# Patient Record
Sex: Male | Born: 1960
Health system: Southern US, Community
[De-identification: ages and names within clinical notes are randomized; demographics above are authoritative.]

## PROBLEM LIST (undated history)

## (undated) DIAGNOSIS — E1165 Type 2 diabetes mellitus with hyperglycemia: Secondary | ICD-10-CM

## (undated) DIAGNOSIS — B20 Human immunodeficiency virus [HIV] disease: Secondary | ICD-10-CM

## (undated) DIAGNOSIS — R945 Abnormal results of liver function studies: Secondary | ICD-10-CM

## (undated) DIAGNOSIS — E119 Type 2 diabetes mellitus without complications: Secondary | ICD-10-CM

## (undated) DIAGNOSIS — R002 Palpitations: Secondary | ICD-10-CM

## (undated) DIAGNOSIS — E785 Hyperlipidemia, unspecified: Secondary | ICD-10-CM

## (undated) DIAGNOSIS — E1169 Type 2 diabetes mellitus with other specified complication: Secondary | ICD-10-CM

## (undated) DIAGNOSIS — R21 Rash and other nonspecific skin eruption: Secondary | ICD-10-CM

## (undated) DIAGNOSIS — I517 Cardiomegaly: Secondary | ICD-10-CM

## (undated) DIAGNOSIS — E669 Obesity, unspecified: Secondary | ICD-10-CM

## (undated) DIAGNOSIS — Z21 Asymptomatic human immunodeficiency virus [HIV] infection status: Secondary | ICD-10-CM

## (undated) DIAGNOSIS — IMO0002 Reserved for concepts with insufficient information to code with codable children: Secondary | ICD-10-CM

## (undated) HISTORY — DX: Obesity, unspecified: E66.9

## (undated) HISTORY — DX: Palpitations: R00.2

## (undated) HISTORY — DX: Type 2 diabetes mellitus with other specified complication: E11.69

## (undated) HISTORY — DX: Abnormal results of liver function studies: R94.5

## (undated) HISTORY — DX: Reserved for concepts with insufficient information to code with codable children: IMO0002

## (undated) HISTORY — DX: Cardiomegaly: I51.7

## (undated) HISTORY — DX: Hyperlipidemia, unspecified: E78.5

## (undated) HISTORY — DX: Rash and other nonspecific skin eruption: R21

## (undated) HISTORY — DX: Type 2 diabetes mellitus with hyperglycemia: E11.65

## (undated) HISTORY — DX: Type 2 diabetes mellitus without complications: E11.9

## (undated) HISTORY — DX: Asymptomatic human immunodeficiency virus (hiv) infection status: Z21

## (undated) HISTORY — DX: Human immunodeficiency virus (HIV) disease: B20

---

## 2003-10-18 ENCOUNTER — Inpatient Hospital Stay (HOSPITAL_COMMUNITY): Admission: AD | Admit: 2003-10-18 | Discharge: 2003-10-22 | Payer: Self-pay | Admitting: Internal Medicine

## 2003-11-29 ENCOUNTER — Encounter (INDEPENDENT_AMBULATORY_CARE_PROVIDER_SITE_OTHER): Payer: Self-pay | Admitting: *Deleted

## 2003-11-29 LAB — CONVERTED CEMR LAB
CD4 Count: 200 microliters
CD4 T Cell Abs: 200

## 2003-12-01 ENCOUNTER — Inpatient Hospital Stay (HOSPITAL_COMMUNITY): Admission: EM | Admit: 2003-12-01 | Discharge: 2003-12-07 | Payer: Self-pay | Admitting: Emergency Medicine

## 2003-12-02 ENCOUNTER — Encounter: Payer: Self-pay | Admitting: Internal Medicine

## 2004-01-30 ENCOUNTER — Encounter: Admission: RE | Admit: 2004-01-30 | Discharge: 2004-01-30 | Payer: Self-pay | Admitting: Infectious Diseases

## 2004-01-30 ENCOUNTER — Ambulatory Visit (HOSPITAL_COMMUNITY): Admission: RE | Admit: 2004-01-30 | Discharge: 2004-01-30 | Payer: Self-pay | Admitting: Infectious Diseases

## 2004-02-13 ENCOUNTER — Encounter: Admission: RE | Admit: 2004-02-13 | Discharge: 2004-02-13 | Payer: Self-pay | Admitting: Infectious Diseases

## 2004-06-12 ENCOUNTER — Ambulatory Visit (HOSPITAL_COMMUNITY): Admission: RE | Admit: 2004-06-12 | Discharge: 2004-06-12 | Payer: Self-pay | Admitting: Infectious Diseases

## 2004-06-12 ENCOUNTER — Encounter: Admission: RE | Admit: 2004-06-12 | Discharge: 2004-06-12 | Payer: Self-pay | Admitting: Infectious Diseases

## 2004-07-10 ENCOUNTER — Encounter: Admission: RE | Admit: 2004-07-10 | Discharge: 2004-07-10 | Payer: Self-pay | Admitting: Infectious Diseases

## 2004-11-07 ENCOUNTER — Ambulatory Visit: Payer: Self-pay | Admitting: Internal Medicine

## 2004-11-07 ENCOUNTER — Ambulatory Visit (HOSPITAL_COMMUNITY): Admission: RE | Admit: 2004-11-07 | Discharge: 2004-11-07 | Payer: Self-pay | Admitting: Infectious Diseases

## 2005-01-19 ENCOUNTER — Ambulatory Visit: Payer: Self-pay | Admitting: Infectious Diseases

## 2005-04-20 ENCOUNTER — Ambulatory Visit (HOSPITAL_COMMUNITY): Admission: RE | Admit: 2005-04-20 | Discharge: 2005-04-20 | Payer: Self-pay | Admitting: Infectious Diseases

## 2005-04-20 ENCOUNTER — Ambulatory Visit: Payer: Self-pay | Admitting: Infectious Diseases

## 2005-05-26 ENCOUNTER — Ambulatory Visit: Payer: Self-pay | Admitting: Infectious Diseases

## 2005-11-04 ENCOUNTER — Ambulatory Visit: Payer: Self-pay | Admitting: Infectious Diseases

## 2006-01-04 ENCOUNTER — Ambulatory Visit: Payer: Self-pay | Admitting: Infectious Diseases

## 2006-04-29 ENCOUNTER — Ambulatory Visit: Payer: Self-pay | Admitting: Infectious Diseases

## 2006-04-29 ENCOUNTER — Encounter (INDEPENDENT_AMBULATORY_CARE_PROVIDER_SITE_OTHER): Payer: Self-pay | Admitting: *Deleted

## 2006-04-29 ENCOUNTER — Encounter: Admission: RE | Admit: 2006-04-29 | Discharge: 2006-04-29 | Payer: Self-pay | Admitting: Infectious Diseases

## 2006-04-29 LAB — CONVERTED CEMR LAB
CD4 Count: 370 microliters
HIV 1 RNA Quant: 65 copies/mL

## 2006-05-31 ENCOUNTER — Ambulatory Visit: Payer: Self-pay | Admitting: Infectious Diseases

## 2006-10-06 ENCOUNTER — Ambulatory Visit: Payer: Self-pay | Admitting: Infectious Diseases

## 2006-10-06 ENCOUNTER — Encounter: Admission: RE | Admit: 2006-10-06 | Discharge: 2006-10-06 | Payer: Self-pay | Admitting: Infectious Diseases

## 2006-10-06 ENCOUNTER — Encounter (INDEPENDENT_AMBULATORY_CARE_PROVIDER_SITE_OTHER): Payer: Self-pay | Admitting: *Deleted

## 2006-10-06 LAB — CONVERTED CEMR LAB
ALT: 212 units/L — ABNORMAL HIGH (ref 0–40)
AST: 174 units/L — ABNORMAL HIGH (ref 0–37)
Albumin: 4 g/dL (ref 3.5–5.2)
Alkaline Phosphatase: 127 units/L — ABNORMAL HIGH (ref 39–117)
BUN: 11 mg/dL (ref 6–23)
Basophils Absolute: 0.1 10*3/uL (ref 0.0–0.1)
Basophils percent auto: 2 % — ABNORMAL HIGH (ref 0–1)
CD4 Count: 420 microliters
CO2: 22 meq/L (ref 19–32)
Calcium: 9.2 mg/dL (ref 8.4–10.5)
Chloride: 105 meq/L (ref 96–112)
Creatinine, Ser: 0.92 mg/dL (ref 0.40–1.50)
Eosinophils Absolute: 0.2 cells/mcL (ref 0.0–0.7)
Eosinophils Relative: 3 % (ref 0–5)
Glucose, Bld: 135 mg/dL — ABNORMAL HIGH (ref 70–99)
HCT: 43.3 % (ref 39.0–52.0)
HIV 1 RNA Quant: 49 copies/mL
HIV 1 RNA Quant: <50 copies/mL (ref <50)
HIV-1 RNA Quant, Log: <1.7 (ref <1.70)
Hemoglobin: 15.4 g/dL (ref 13.0–17.0)
Leukocyte count, blood: 5.5 10*9/L (ref 4.0–10.5)
Lymphocytes Relative: 51 % — ABNORMAL HIGH (ref 12–46)
Lymphs Abs: 2.8 10*3/uL (ref 0.7–3.3)
MCHC: 35.6 g/dL (ref 30.0–36.0)
MCV: 89.5 fL (ref 78.0–100.0)
Monocytes Absolute: 0.4 10*3/uL (ref 0.2–0.7)
Monocytes Relative: 7 % (ref 3–11)
Neutro Abs: 2.1 10*3/uL (ref 1.7–7.7)
Neutrophils Relative %: 38 % — ABNORMAL LOW (ref 43–77)
Platelets: 276 10*3/uL (ref 150–400)
Potassium: 4.2 meq/L (ref 3.5–5.3)
RBC: 4.84 M/uL (ref 4.22–5.81)
RDW: 13.2 % (ref 11.5–14.0)
Sodium: 138 meq/L (ref 135–145)
Total Bilirubin: 0.8 mg/dL (ref 0.3–1.2)
Total Protein: 7.8 g/dL (ref 6.0–8.3)

## 2006-11-26 ENCOUNTER — Encounter (INDEPENDENT_AMBULATORY_CARE_PROVIDER_SITE_OTHER): Payer: Self-pay | Admitting: Infectious Diseases

## 2006-11-26 DIAGNOSIS — B20 Human immunodeficiency virus [HIV] disease: Secondary | ICD-10-CM | POA: Insufficient documentation

## 2006-12-27 ENCOUNTER — Ambulatory Visit: Payer: Self-pay | Admitting: Infectious Diseases

## 2006-12-27 ENCOUNTER — Encounter (INDEPENDENT_AMBULATORY_CARE_PROVIDER_SITE_OTHER): Payer: Self-pay | Admitting: *Deleted

## 2006-12-27 ENCOUNTER — Encounter: Admission: RE | Admit: 2006-12-27 | Discharge: 2006-12-27 | Payer: Self-pay | Admitting: Infectious Diseases

## 2006-12-27 LAB — CONVERTED CEMR LAB
CD4 Count: 340 microliters
HIV 1 RNA Quant: 798 copies/mL

## 2007-02-13 ENCOUNTER — Emergency Department (HOSPITAL_COMMUNITY): Admission: EM | Admit: 2007-02-13 | Discharge: 2007-02-14 | Payer: Self-pay | Admitting: Emergency Medicine

## 2007-02-14 ENCOUNTER — Encounter (INDEPENDENT_AMBULATORY_CARE_PROVIDER_SITE_OTHER): Payer: Self-pay | Admitting: *Deleted

## 2007-02-14 LAB — CONVERTED CEMR LAB

## 2007-02-27 ENCOUNTER — Encounter (INDEPENDENT_AMBULATORY_CARE_PROVIDER_SITE_OTHER): Payer: Self-pay | Admitting: *Deleted

## 2007-03-10 ENCOUNTER — Ambulatory Visit: Payer: Self-pay | Admitting: Infectious Diseases

## 2007-03-10 LAB — CONVERTED CEMR LAB
ALT: 112 U/L — ABNORMAL HIGH
AST: 80 U/L — ABNORMAL HIGH
Albumin: 4.4 g/dL
Alkaline Phosphatase: 104 U/L
BUN: 15 mg/dL
Basophils Absolute: 0.1 K/uL
Basophils Relative: 2 % — ABNORMAL HIGH
CO2: 24 meq/L
Calcium: 9.7 mg/dL
Chloride: 105 meq/L
Creatinine, Ser: 1.25 mg/dL
Eosinophils Absolute: 0.2 K/uL
Eosinophils Relative: 3 %
Glucose, Bld: 91 mg/dL
HCT: 45.2 %
HIV 1 RNA Quant: 72 {copies}/mL — ABNORMAL HIGH
HIV-1 RNA Quant, Log: 1.86 — ABNORMAL HIGH
Hemoglobin: 15.6 g/dL
Lymphocytes Relative: 51 % — ABNORMAL HIGH
Lymphs Abs: 3.4 K/uL — ABNORMAL HIGH
MCHC: 34.5 g/dL
MCV: 93.8 fL
Monocytes Absolute: 0.5 K/uL
Monocytes Relative: 7 %
Neutro Abs: 2.5 K/uL
Neutrophils Relative %: 37 % — ABNORMAL LOW
Platelets: 293 K/uL
Potassium: 4.4 meq/L
RBC: 4.82 M/uL
RDW: 12.6 %
Sodium: 141 meq/L
Total Bilirubin: 1.6 mg/dL — ABNORMAL HIGH
Total Protein: 8.1 g/dL
WBC: 6.7 10*3/microliter

## 2007-03-16 DIAGNOSIS — L089 Local infection of the skin and subcutaneous tissue, unspecified: Secondary | ICD-10-CM | POA: Insufficient documentation

## 2007-03-17 ENCOUNTER — Telehealth: Payer: Self-pay | Admitting: Infectious Diseases

## 2007-03-28 ENCOUNTER — Telehealth (INDEPENDENT_AMBULATORY_CARE_PROVIDER_SITE_OTHER): Payer: Self-pay | Admitting: Infectious Diseases

## 2007-04-27 ENCOUNTER — Ambulatory Visit: Payer: Self-pay | Admitting: Infectious Diseases

## 2007-07-06 ENCOUNTER — Telehealth: Payer: Self-pay | Admitting: Internal Medicine

## 2007-09-21 ENCOUNTER — Encounter: Admission: RE | Admit: 2007-09-21 | Discharge: 2007-09-21 | Payer: Self-pay | Admitting: Internal Medicine

## 2007-09-21 ENCOUNTER — Encounter: Payer: Self-pay | Admitting: Infectious Disease

## 2007-09-21 ENCOUNTER — Ambulatory Visit: Payer: Self-pay | Admitting: Internal Medicine

## 2007-09-21 LAB — CONVERTED CEMR LAB
ALT: 41 units/L (ref 0–53)
AST: 49 units/L — ABNORMAL HIGH (ref 0–37)
Albumin: 4.9 g/dL (ref 3.5–5.2)
Alkaline Phosphatase: 72 units/L (ref 39–117)
BUN: 21 mg/dL (ref 6–23)
Basophils Absolute: 0.1 10*3/uL (ref 0.0–0.1)
Basophils Relative: 2 % — ABNORMAL HIGH (ref 0–1)
CO2: 20 meq/L (ref 19–32)
Calcium: 9.4 mg/dL (ref 8.4–10.5)
Chloride: 102 meq/L (ref 96–112)
Creatinine, Ser: 0.95 mg/dL (ref 0.40–1.50)
Eosinophils Absolute: 0.2 10*3/uL (ref 0.0–0.7)
Eosinophils Relative: 3 % (ref 0–5)
Glucose, Bld: 86 mg/dL (ref 70–99)
HCT: 46.8 % (ref 39.0–52.0)
Hemoglobin: 16.2 g/dL (ref 13.0–17.0)
Lymphocytes Relative: 46 % (ref 12–46)
Lymphs Abs: 3.1 10*3/uL (ref 0.7–3.3)
MCHC: 34.6 g/dL (ref 30.0–36.0)
MCV: 99.4 fL (ref 78.0–100.0)
Monocytes Absolute: 0.3 10*3/uL (ref 0.2–0.7)
Monocytes Relative: 4 % (ref 3–11)
Neutro Abs: 3 10*3/uL (ref 1.7–7.7)
Neutrophils Relative %: 45 % (ref 43–77)
Platelets: 338 10*3/uL (ref 150–400)
Potassium: 4.5 meq/L (ref 3.5–5.3)
RBC: 4.71 M/uL (ref 4.22–5.81)
RDW: 14 % (ref 11.5–14.0)
Sodium: 134 meq/L — ABNORMAL LOW (ref 135–145)
Total Bilirubin: 1.1 mg/dL (ref 0.3–1.2)
Total Protein: 7.5 g/dL (ref 6.0–8.3)
WBC: 6.6 10*3/uL (ref 4.0–10.5)

## 2007-09-22 ENCOUNTER — Encounter: Payer: Self-pay | Admitting: Infectious Disease

## 2007-09-22 LAB — CONVERTED CEMR LAB
HIV 1 RNA Quant: 152 copies/mL — ABNORMAL HIGH (ref <50)
HIV-1 RNA Quant, Log: 2.18 — ABNORMAL HIGH (ref <1.70)

## 2007-11-28 ENCOUNTER — Ambulatory Visit: Payer: Self-pay | Admitting: Infectious Disease

## 2007-11-28 DIAGNOSIS — K739 Chronic hepatitis, unspecified: Secondary | ICD-10-CM | POA: Insufficient documentation

## 2007-12-20 ENCOUNTER — Encounter (INDEPENDENT_AMBULATORY_CARE_PROVIDER_SITE_OTHER): Payer: Self-pay | Admitting: *Deleted

## 2008-02-27 ENCOUNTER — Encounter (INDEPENDENT_AMBULATORY_CARE_PROVIDER_SITE_OTHER): Payer: Self-pay | Admitting: *Deleted

## 2008-03-08 ENCOUNTER — Encounter: Payer: Self-pay | Admitting: Infectious Disease

## 2008-03-13 ENCOUNTER — Encounter (INDEPENDENT_AMBULATORY_CARE_PROVIDER_SITE_OTHER): Payer: Self-pay | Admitting: *Deleted

## 2008-03-15 ENCOUNTER — Encounter: Admission: RE | Admit: 2008-03-15 | Discharge: 2008-03-15 | Payer: Self-pay | Admitting: Infectious Disease

## 2008-03-15 ENCOUNTER — Ambulatory Visit: Payer: Self-pay | Admitting: Infectious Disease

## 2008-03-15 LAB — CONVERTED CEMR LAB
ALT: 71 units/L — ABNORMAL HIGH (ref 0–53)
AST: 53 units/L — ABNORMAL HIGH (ref 0–37)
Albumin: 4.6 g/dL (ref 3.5–5.2)
Alkaline Phosphatase: 72 units/L (ref 39–117)
BUN: 11 mg/dL (ref 6–23)
Basophils Absolute: 0.1 10*3/uL (ref 0.0–0.1)
Basophils Relative: 1 % (ref 0–1)
CO2: 27 meq/L (ref 19–32)
Calcium: 9.3 mg/dL (ref 8.4–10.5)
Chloride: 104 meq/L (ref 96–112)
Cholesterol: 249 mg/dL — ABNORMAL HIGH (ref 0–200)
Creatinine, Ser: 0.98 mg/dL (ref 0.40–1.50)
Eosinophils Absolute: 0.2 10*3/uL (ref 0.0–0.7)
Eosinophils Relative: 2 % (ref 0–5)
Glucose, Bld: 57 mg/dL — ABNORMAL LOW (ref 70–99)
HCT: 43.4 % (ref 39.0–52.0)
HDL: 31 mg/dL — ABNORMAL LOW (ref >39)
HIV 1 RNA Quant: 237 copies/mL — ABNORMAL HIGH (ref <50)
HIV-1 RNA Quant, Log: 2.37 — ABNORMAL HIGH (ref <1.70)
Hemoglobin: 15.2 g/dL (ref 13.0–17.0)
Lymphocytes Relative: 45 % (ref 12–46)
Lymphs Abs: 3 10*3/uL (ref 0.7–4.0)
MCHC: 35 g/dL (ref 30.0–36.0)
MCV: 90 fL (ref 78.0–100.0)
Monocytes Absolute: 0.5 10*3/uL (ref 0.1–1.0)
Monocytes Relative: 8 % (ref 3–12)
Neutro Abs: 2.9 10*3/uL (ref 1.7–7.7)
Neutrophils Relative %: 44 % (ref 43–77)
Platelets: 275 10*3/uL (ref 150–400)
Potassium: 4 meq/L (ref 3.5–5.3)
RBC: 4.82 M/uL (ref 4.22–5.81)
RDW: 13 % (ref 11.5–15.5)
Sodium: 142 meq/L (ref 135–145)
Total Bilirubin: 1.3 mg/dL — ABNORMAL HIGH (ref 0.3–1.2)
Total CHOL/HDL Ratio: 8
Total Protein: 7.9 g/dL (ref 6.0–8.3)
Triglycerides: 457 mg/dL — ABNORMAL HIGH (ref <150)
WBC: 6.6 10*3/uL (ref 4.0–10.5)

## 2008-03-26 ENCOUNTER — Ambulatory Visit (HOSPITAL_COMMUNITY): Admission: RE | Admit: 2008-03-26 | Discharge: 2008-03-26 | Payer: Self-pay | Admitting: Infectious Disease

## 2008-03-26 ENCOUNTER — Ambulatory Visit: Payer: Self-pay | Admitting: Infectious Disease

## 2008-03-26 ENCOUNTER — Telehealth: Payer: Self-pay | Admitting: Infectious Disease

## 2008-03-26 ENCOUNTER — Observation Stay (HOSPITAL_COMMUNITY): Admission: AD | Admit: 2008-03-26 | Discharge: 2008-03-27 | Payer: Self-pay | Admitting: Infectious Disease

## 2008-03-26 ENCOUNTER — Encounter: Payer: Self-pay | Admitting: Infectious Disease

## 2008-03-26 ENCOUNTER — Encounter: Admission: RE | Admit: 2008-03-26 | Discharge: 2008-03-26 | Payer: Self-pay | Admitting: Infectious Disease

## 2008-03-26 ENCOUNTER — Ambulatory Visit: Payer: Self-pay | Admitting: Vascular Surgery

## 2008-03-26 ENCOUNTER — Encounter (INDEPENDENT_AMBULATORY_CARE_PROVIDER_SITE_OTHER): Payer: Self-pay | Admitting: *Deleted

## 2008-03-26 ENCOUNTER — Ambulatory Visit: Payer: Self-pay | Admitting: Infectious Diseases

## 2008-03-26 DIAGNOSIS — R002 Palpitations: Secondary | ICD-10-CM | POA: Insufficient documentation

## 2008-03-26 DIAGNOSIS — F411 Generalized anxiety disorder: Secondary | ICD-10-CM | POA: Insufficient documentation

## 2008-03-26 DIAGNOSIS — E781 Pure hyperglyceridemia: Secondary | ICD-10-CM | POA: Insufficient documentation

## 2008-03-26 DIAGNOSIS — R609 Edema, unspecified: Secondary | ICD-10-CM | POA: Insufficient documentation

## 2008-03-29 ENCOUNTER — Telehealth: Payer: Self-pay | Admitting: Infectious Disease

## 2008-03-29 ENCOUNTER — Encounter: Payer: Self-pay | Admitting: Infectious Disease

## 2008-04-09 ENCOUNTER — Encounter: Payer: Self-pay | Admitting: Infectious Disease

## 2008-05-09 ENCOUNTER — Telehealth: Payer: Self-pay | Admitting: Infectious Disease

## 2008-06-08 ENCOUNTER — Ambulatory Visit: Payer: Self-pay | Admitting: Infectious Disease

## 2008-06-08 LAB — CONVERTED CEMR LAB
ALT: 79 units/L — ABNORMAL HIGH (ref 0–53)
AST: 59 units/L — ABNORMAL HIGH (ref 0–37)
Absolute CD4: 648 #/uL (ref 381–1469)
Albumin: 4.5 g/dL (ref 3.5–5.2)
Alkaline Phosphatase: 74 units/L (ref 39–117)
BUN: 15 mg/dL (ref 6–23)
Basophils Absolute: 0.1 10*3/uL (ref 0.0–0.1)
Basophils Relative: 1 % (ref 0–1)
CD4 T Helper %: 18 % — ABNORMAL LOW (ref 32–62)
CO2: 20 meq/L (ref 19–32)
Calcium: 9 mg/dL (ref 8.4–10.5)
Chloride: 106 meq/L (ref 96–112)
Creatinine, Ser: 0.79 mg/dL (ref 0.40–1.50)
Eosinophils Absolute: 0.2 10*3/uL (ref 0.0–0.7)
Eosinophils Relative: 3 % (ref 0–5)
Glucose, Bld: 66 mg/dL — ABNORMAL LOW (ref 70–99)
HCT: 45 % (ref 39.0–52.0)
HIV 1 RNA Quant: 354 copies/mL — ABNORMAL HIGH (ref <50)
HIV-1 RNA Quant, Log: 2.55 — ABNORMAL HIGH (ref <1.70)
Hemoglobin: 15.8 g/dL (ref 13.0–17.0)
Lymphocytes Relative: 48 % — ABNORMAL HIGH (ref 12–46)
Lymphs Abs: 3.6 10*3/uL (ref 0.7–4.0)
MCHC: 35.1 g/dL (ref 30.0–36.0)
MCV: 89.6 fL (ref 78.0–100.0)
Monocytes Absolute: 0.7 10*3/uL (ref 0.1–1.0)
Monocytes Relative: 9 % (ref 3–12)
Neutro Abs: 2.9 10*3/uL (ref 1.7–7.7)
Neutrophils Relative %: 39 % — ABNORMAL LOW (ref 43–77)
Platelets: 259 10*3/uL (ref 150–400)
Potassium: 3.8 meq/L (ref 3.5–5.3)
RBC: 5.02 M/uL (ref 4.22–5.81)
RDW: 12.8 % (ref 11.5–15.5)
Sodium: 139 meq/L (ref 135–145)
Total Bilirubin: 2.1 mg/dL — ABNORMAL HIGH (ref 0.3–1.2)
Total Lymphocytes %: 48 % — ABNORMAL HIGH (ref 12–46)
Total Protein: 7.8 g/dL (ref 6.0–8.3)
Total lymphocyte count: 3600 cells/mcL — ABNORMAL HIGH (ref 700–3300)
WBC, lymph enumeration: 7.5 10*3/uL (ref 4.0–10.5)
WBC: 7.5 10*3/uL (ref 4.0–10.5)

## 2008-06-18 ENCOUNTER — Ambulatory Visit: Payer: Self-pay | Admitting: Infectious Disease

## 2008-06-18 LAB — CONVERTED CEMR LAB
Chlamydia, Swab/Urine, PCR: NEGATIVE
GC Probe Amp, Urine: NEGATIVE

## 2008-10-30 ENCOUNTER — Ambulatory Visit: Payer: Self-pay | Admitting: Infectious Disease

## 2008-10-30 LAB — CONVERTED CEMR LAB
ALT: 28 units/L (ref 0–53)
AST: 23 units/L (ref 0–37)
Albumin: 4.5 g/dL (ref 3.5–5.2)
Alkaline Phosphatase: 84 units/L (ref 39–117)
BUN: 19 mg/dL (ref 6–23)
Basophils Absolute: 0 10*3/uL (ref 0.0–0.1)
Basophils Relative: 1 % (ref 0–1)
CO2: 20 meq/L (ref 19–32)
Calcium: 9.1 mg/dL (ref 8.4–10.5)
Chloride: 105 meq/L (ref 96–112)
Cholesterol: 215 mg/dL — ABNORMAL HIGH (ref 0–200)
Creatinine, Ser: 0.95 mg/dL (ref 0.40–1.50)
Eosinophils Absolute: 0.2 10*3/uL (ref 0.0–0.7)
Eosinophils Relative: 3 % (ref 0–5)
Glucose, Bld: 91 mg/dL (ref 70–99)
HCT: 45.3 % (ref 39.0–52.0)
HDL: 32 mg/dL — ABNORMAL LOW (ref >39)
HIV 1 RNA Quant: <48 copies/mL (ref <48)
HIV-1 RNA Quant, Log: <1.68 (ref <1.68)
Hemoglobin: 15.6 g/dL (ref 13.0–17.0)
LDL Cholesterol: 114 mg/dL — ABNORMAL HIGH (ref 0–99)
Lymphocytes Relative: 44 % (ref 12–46)
Lymphs Abs: 2.4 10*3/uL (ref 0.7–4.0)
MCHC: 34.4 g/dL (ref 30.0–36.0)
MCV: 91.3 fL (ref 78.0–100.0)
Monocytes Absolute: 0.5 10*3/uL (ref 0.1–1.0)
Monocytes Relative: 9 % (ref 3–12)
Neutro Abs: 2.4 10*3/uL (ref 1.7–7.7)
Neutrophils Relative %: 44 % (ref 43–77)
Platelets: 286 10*3/uL (ref 150–400)
Potassium: 4.5 meq/L (ref 3.5–5.3)
RBC: 4.96 M/uL (ref 4.22–5.81)
RDW: 13.2 % (ref 11.5–15.5)
Sodium: 138 meq/L (ref 135–145)
Total Bilirubin: 0.9 mg/dL (ref 0.3–1.2)
Total CHOL/HDL Ratio: 6.7
Total Protein: 7.9 g/dL (ref 6.0–8.3)
Triglycerides: 345 mg/dL — ABNORMAL HIGH (ref <150)
VLDL: 69 mg/dL — ABNORMAL HIGH (ref 0–40)
WBC: 5.6 10*3/uL (ref 4.0–10.5)

## 2009-04-17 ENCOUNTER — Encounter (INDEPENDENT_AMBULATORY_CARE_PROVIDER_SITE_OTHER): Payer: Self-pay | Admitting: *Deleted

## 2009-05-21 ENCOUNTER — Encounter (INDEPENDENT_AMBULATORY_CARE_PROVIDER_SITE_OTHER): Payer: Self-pay | Admitting: Licensed Clinical Social Worker

## 2009-05-21 ENCOUNTER — Ambulatory Visit: Payer: Self-pay | Admitting: Infectious Disease

## 2009-05-21 LAB — CONVERTED CEMR LAB
ALT: 49 units/L (ref 0–53)
AST: 38 units/L — ABNORMAL HIGH (ref 0–37)
Albumin: 4.6 g/dL (ref 3.5–5.2)
Alkaline Phosphatase: 71 units/L (ref 39–117)
BUN: 16 mg/dL (ref 6–23)
Basophils Absolute: 0.1 10*3/uL (ref 0.0–0.1)
Basophils Relative: 1 % (ref 0–1)
CO2: 23 meq/L (ref 19–32)
Calcium: 9.5 mg/dL (ref 8.4–10.5)
Chloride: 105 meq/L (ref 96–112)
Creatinine, Ser: 0.98 mg/dL (ref 0.40–1.50)
Eosinophils Absolute: 0.2 10*3/uL (ref 0.0–0.7)
Eosinophils Relative: 4 % (ref 0–5)
GFR calc Af Amer: >60 mL/min (ref >60)
GFR calc non Af Amer: >60 mL/min (ref >60)
Glucose, Bld: 90 mg/dL (ref 70–99)
HCT: 45.4 % (ref 39.0–52.0)
HIV 1 RNA Quant: <48 copies/mL (ref <48)
HIV-1 RNA Quant, Log: <1.68 (ref <1.68)
Hemoglobin: 15.5 g/dL (ref 13.0–17.0)
Lymphocytes Relative: 40 % (ref 12–46)
Lymphs Abs: 2.3 10*3/uL (ref 0.7–4.0)
MCHC: 34.1 g/dL (ref 30.0–36.0)
MCV: 91.2 fL (ref 78.0–100.0)
Monocytes Absolute: 0.5 10*3/uL (ref 0.1–1.0)
Monocytes Relative: 8 % (ref 3–12)
Neutro Abs: 2.8 10*3/uL (ref 1.7–7.7)
Neutrophils Relative %: 47 % (ref 43–77)
Platelets: 285 10*3/uL (ref 150–400)
Potassium: 4.3 meq/L (ref 3.5–5.3)
RBC: 4.98 M/uL (ref 4.22–5.81)
RDW: 13.1 % (ref 11.5–15.5)
Sodium: 139 meq/L (ref 135–145)
Total Bilirubin: 1.8 mg/dL — ABNORMAL HIGH (ref 0.3–1.2)
Total Protein: 8 g/dL (ref 6.0–8.3)
WBC: 5.8 10*3/uL (ref 4.0–10.5)

## 2009-09-11 ENCOUNTER — Ambulatory Visit: Payer: Self-pay | Admitting: Infectious Disease

## 2009-09-11 ENCOUNTER — Ambulatory Visit (HOSPITAL_COMMUNITY): Admission: RE | Admit: 2009-09-11 | Discharge: 2009-09-11 | Payer: Self-pay | Admitting: Infectious Disease

## 2009-09-11 DIAGNOSIS — L255 Unspecified contact dermatitis due to plants, except food: Secondary | ICD-10-CM | POA: Insufficient documentation

## 2009-09-11 DIAGNOSIS — L259 Unspecified contact dermatitis, unspecified cause: Secondary | ICD-10-CM | POA: Insufficient documentation

## 2009-09-12 ENCOUNTER — Encounter: Payer: Self-pay | Admitting: Infectious Disease

## 2009-09-12 LAB — CONVERTED CEMR LAB
Amphetamine Screen, Ur: NEGATIVE
Barbiturate Quant, Ur: NEGATIVE
Benzodiazepines.: NEGATIVE
Cocaine Metabolites: NEGATIVE
Creatinine,U: 98.9 mg/dL
Marijuana Metabolite: NEGATIVE
Methadone: NEGATIVE
Opiate Screen, Urine: NEGATIVE
Phencyclidine (PCP): NEGATIVE
Propoxyphene: NEGATIVE

## 2009-10-16 ENCOUNTER — Ambulatory Visit (HOSPITAL_COMMUNITY): Admission: RE | Admit: 2009-10-16 | Discharge: 2009-10-16 | Payer: Self-pay | Admitting: Infectious Disease

## 2009-10-16 ENCOUNTER — Encounter: Payer: Self-pay | Admitting: Infectious Disease

## 2009-10-16 ENCOUNTER — Ambulatory Visit: Payer: Self-pay | Admitting: Cardiology

## 2010-01-20 ENCOUNTER — Encounter (INDEPENDENT_AMBULATORY_CARE_PROVIDER_SITE_OTHER): Payer: Self-pay | Admitting: *Deleted

## 2010-02-07 ENCOUNTER — Encounter: Payer: Self-pay | Admitting: Infectious Disease

## 2010-02-25 ENCOUNTER — Encounter (INDEPENDENT_AMBULATORY_CARE_PROVIDER_SITE_OTHER): Payer: Self-pay | Admitting: *Deleted

## 2010-04-29 ENCOUNTER — Encounter: Payer: Self-pay | Admitting: Infectious Disease

## 2010-05-05 ENCOUNTER — Ambulatory Visit: Payer: Self-pay | Admitting: Infectious Disease

## 2010-05-05 LAB — CONVERTED CEMR LAB
ALT: 145 units/L — ABNORMAL HIGH (ref 0–53)
AST: 128 units/L — ABNORMAL HIGH (ref 0–37)
Albumin: 4.3 g/dL (ref 3.5–5.2)
Alkaline Phosphatase: 102 units/L (ref 39–117)
BUN: 12 mg/dL (ref 6–23)
Basophils Absolute: 0.1 10*3/uL (ref 0.0–0.1)
Basophils Relative: 2 % — ABNORMAL HIGH (ref 0–1)
CO2: 23 meq/L (ref 19–32)
Calcium: 8.8 mg/dL (ref 8.4–10.5)
Chloride: 103 meq/L (ref 96–112)
Cholesterol: 210 mg/dL — ABNORMAL HIGH (ref 0–200)
Creatinine, Ser: 0.88 mg/dL (ref 0.40–1.50)
Eosinophils Absolute: 0.2 10*3/uL (ref 0.0–0.7)
Eosinophils Relative: 4 % (ref 0–5)
Glucose, Bld: 178 mg/dL — ABNORMAL HIGH (ref 70–99)
HCT: 44.8 % (ref 39.0–52.0)
HDL: 32 mg/dL — ABNORMAL LOW (ref >39)
HIV 1 RNA Quant: 243 copies/mL — ABNORMAL HIGH (ref <48)
HIV-1 RNA Quant, Log: 2.39 — ABNORMAL HIGH (ref <1.68)
Hemoglobin: 15.6 g/dL (ref 13.0–17.0)
Lymphocytes Relative: 55 % — ABNORMAL HIGH (ref 12–46)
Lymphs Abs: 2.3 10*3/uL (ref 0.7–4.0)
MCHC: 34.8 g/dL (ref 30.0–36.0)
MCV: 89.4 fL (ref 78.0–100.0)
Monocytes Absolute: 0.3 10*3/uL (ref 0.1–1.0)
Monocytes Relative: 7 % (ref 3–12)
Neutro Abs: 1.3 10*3/uL — ABNORMAL LOW (ref 1.7–7.7)
Neutrophils Relative %: 33 % — ABNORMAL LOW (ref 43–77)
Platelets: 218 10*3/uL (ref 150–400)
Potassium: 3.7 meq/L (ref 3.5–5.3)
RBC: 5.01 M/uL (ref 4.22–5.81)
RDW: 13 % (ref 11.5–15.5)
Sodium: 135 meq/L (ref 135–145)
Total Bilirubin: 1 mg/dL (ref 0.3–1.2)
Total CHOL/HDL Ratio: 6.6
Total Protein: 7.5 g/dL (ref 6.0–8.3)
Triglycerides: 402 mg/dL — ABNORMAL HIGH (ref <150)
WBC: 4.1 10*3/uL (ref 4.0–10.5)

## 2010-05-22 ENCOUNTER — Telehealth: Payer: Self-pay

## 2010-09-03 ENCOUNTER — Ambulatory Visit: Payer: Self-pay | Admitting: Infectious Disease

## 2010-09-03 LAB — CONVERTED CEMR LAB
ALT: 148 units/L — ABNORMAL HIGH (ref 0–53)
AST: 106 units/L — ABNORMAL HIGH (ref 0–37)
Albumin: 4.2 g/dL (ref 3.5–5.2)
Alkaline Phosphatase: 100 units/L (ref 39–117)
BUN: 20 mg/dL (ref 6–23)
Basophils Absolute: 0.1 10*3/uL (ref 0.0–0.1)
Basophils Relative: 2 % — ABNORMAL HIGH (ref 0–1)
CO2: 24 meq/L (ref 19–32)
Calcium: 9 mg/dL (ref 8.4–10.5)
Chloride: 106 meq/L (ref 96–112)
Creatinine, Ser: 1.01 mg/dL (ref 0.40–1.50)
Eosinophils Absolute: 0.3 10*3/uL (ref 0.0–0.7)
Eosinophils Relative: 5 % (ref 0–5)
Glucose, Bld: 180 mg/dL — ABNORMAL HIGH (ref 70–99)
HCT: 43.5 % (ref 39.0–52.0)
HIV 1 RNA Quant: 192 copies/mL — ABNORMAL HIGH (ref <20)
HIV-1 RNA Quant, Log: 2.28 — ABNORMAL HIGH (ref <1.30)
Hemoglobin: 15.2 g/dL (ref 13.0–17.0)
Lymphocytes Relative: 43 % (ref 12–46)
Lymphs Abs: 2.4 10*3/uL (ref 0.7–4.0)
MCHC: 34.9 g/dL (ref 30.0–36.0)
MCV: 92.2 fL (ref 78.0–100.0)
Monocytes Absolute: 0.4 10*3/uL (ref 0.1–1.0)
Monocytes Relative: 8 % (ref 3–12)
Neutro Abs: 2.4 10*3/uL (ref 1.7–7.7)
Neutrophils Relative %: 43 % (ref 43–77)
Platelets: 214 10*3/uL (ref 150–400)
Potassium: 4.1 meq/L (ref 3.5–5.3)
RBC: 4.72 M/uL (ref 4.22–5.81)
RDW: 13.1 % (ref 11.5–15.5)
Sodium: 137 meq/L (ref 135–145)
Total Bilirubin: 0.7 mg/dL (ref 0.3–1.2)
Total Protein: 7.3 g/dL (ref 6.0–8.3)
WBC: 5.6 10*3/uL (ref 4.0–10.5)

## 2010-10-07 ENCOUNTER — Ambulatory Visit: Payer: Self-pay | Admitting: Infectious Disease

## 2010-10-07 DIAGNOSIS — G4700 Insomnia, unspecified: Secondary | ICD-10-CM | POA: Insufficient documentation

## 2010-10-07 DIAGNOSIS — H9209 Otalgia, unspecified ear: Secondary | ICD-10-CM | POA: Insufficient documentation

## 2010-10-08 ENCOUNTER — Encounter: Payer: Self-pay | Admitting: Infectious Disease

## 2010-10-08 LAB — CONVERTED CEMR LAB
Chlamydia, Swab/Urine, PCR: NEGATIVE
GC Probe Amp, Urine: NEGATIVE
HIV 1 RNA Quant: <20 copies/mL (ref <20)
HIV-1 RNA Quant, Log: <1.3 (ref <1.30)
Hgb A1c MFr Bld: 7.2 % — ABNORMAL HIGH (ref <5.7)

## 2010-10-20 ENCOUNTER — Telehealth: Payer: Self-pay | Admitting: Infectious Disease

## 2010-10-22 ENCOUNTER — Telehealth: Payer: Self-pay | Admitting: Infectious Disease

## 2010-10-24 ENCOUNTER — Telehealth (INDEPENDENT_AMBULATORY_CARE_PROVIDER_SITE_OTHER): Payer: Self-pay | Admitting: Licensed Clinical Social Worker

## 2010-11-11 ENCOUNTER — Encounter (INDEPENDENT_AMBULATORY_CARE_PROVIDER_SITE_OTHER): Payer: Self-pay | Admitting: *Deleted

## 2011-01-18 LAB — CONVERTED CEMR LAB
ALT: 151 units/L — ABNORMAL HIGH (ref 0–53)
ALT: 58 units/L — ABNORMAL HIGH (ref 0–53)
AST: 136 units/L — ABNORMAL HIGH (ref 0–37)
AST: 45 units/L — ABNORMAL HIGH (ref 0–37)
Albumin: 4.3 g/dL (ref 3.5–5.2)
Albumin: 4.5 g/dL (ref 3.5–5.2)
Alkaline Phosphatase: 143 units/L — ABNORMAL HIGH (ref 39–117)
Alkaline Phosphatase: 78 units/L (ref 39–117)
BUN: 13 mg/dL (ref 6–23)
BUN: 18 mg/dL (ref 6–23)
Basophils Absolute: 0 10*3/uL (ref 0.0–0.1)
Basophils Absolute: 0.1 10*3/uL (ref 0.0–0.1)
Basophils Relative: 1 % (ref 0–1)
Basophils Relative: 2 % — ABNORMAL HIGH (ref 0–1)
Bilirubin Urine: NEGATIVE
CO2: 24 meq/L (ref 19–32)
CO2: 24 meq/L (ref 19–32)
Calcium: 9.1 mg/dL (ref 8.4–10.5)
Calcium: 9.3 mg/dL (ref 8.4–10.5)
Chlamydia, Swab/Urine, PCR: NEGATIVE
Chloride: 103 meq/L (ref 96–112)
Chloride: 105 meq/L (ref 96–112)
Cholesterol, target level: 200 mg/dL
Cholesterol: 220 mg/dL — ABNORMAL HIGH (ref 0–200)
Cholesterol: 254 mg/dL — ABNORMAL HIGH (ref 0–200)
Creatinine, Ser: 0.95 mg/dL (ref 0.40–1.50)
Creatinine, Ser: 1.06 mg/dL (ref 0.40–1.50)
Creatinine, Urine: 99.1 mg/dL
Eosinophils Absolute: 0.2 10*3/uL (ref 0.0–0.7)
Eosinophils Relative: 3 % (ref 0–5)
Eosinophils Relative: 3 % (ref 0–5)
Free T4: 0.91 ng/dL (ref 0.80–1.80)
GC Probe Amp, Urine: NEGATIVE
Glucose, Bld: 208 mg/dL — ABNORMAL HIGH (ref 70–99)
Glucose, Bld: 82 mg/dL (ref 70–99)
HCT: 44.3 % (ref 39.0–52.0)
HCT: 47.9 % (ref 39.0–52.0)
HDL goal, serum: 40 mg/dL
HDL: 29 mg/dL — ABNORMAL LOW (ref >39)
HDL: 30 mg/dL — ABNORMAL LOW (ref >39)
HIV 1 RNA Quant: 74 copies/mL — ABNORMAL HIGH (ref <48)
HIV 1 RNA Quant: 798 copies/mL — ABNORMAL HIGH (ref <50)
HIV-1 RNA Quant, Log: 1.87 — ABNORMAL HIGH (ref <1.68)
HIV-1 RNA Quant, Log: 2.9 — ABNORMAL HIGH (ref <1.70)
Hemoglobin, Urine: NEGATIVE
Hemoglobin: 15.7 g/dL (ref 13.0–17.0)
Hemoglobin: 16.6 g/dL (ref 13.0–17.0)
Hgb A1c MFr Bld: 6 %
Ketones, ur: NEGATIVE mg/dL
LDL Cholesterol: 135 mg/dL — ABNORMAL HIGH (ref 0–99)
LDL Goal: 100 mg/dL
Leukocytes, UA: NEGATIVE
Lymphocytes Relative: 41 % (ref 12–46)
Lymphocytes Relative: 41 % (ref 12–46)
Lymphs Abs: 2.6 10*3/uL (ref 0.7–4.0)
Lymphs Abs: 2.8 10*3/uL (ref 0.7–3.3)
MCHC: 34.7 g/dL (ref 30.0–36.0)
MCHC: 35.4 g/dL (ref 30.0–36.0)
MCV: 89.9 fL (ref >=78.0)
MCV: 93.4 fL (ref 78.0–100.0)
Microalb Creat Ratio: 37.7 mg/g — ABNORMAL HIGH (ref 0.0–30.0)
Microalb, Ur: 3.74 mg/dL — ABNORMAL HIGH (ref 0.00–1.89)
Monocytes Absolute: 0.4 10*3/uL (ref 0.1–1.0)
Monocytes Absolute: 0.5 10*3/uL (ref 0.2–0.7)
Monocytes Relative: 6 % (ref 3–12)
Monocytes Relative: 7 % (ref 3–11)
Neutro Abs: 3 10*3/uL (ref 1.7–7.7)
Neutro Abs: 3.2 10*3/uL (ref 1.7–7.7)
Neutrophils Relative %: 48 % (ref 43–77)
Neutrophils Relative %: 49 % (ref 43–77)
Nitrite: NEGATIVE
Platelets: 237 10*3/uL (ref 150–400)
Platelets: 299 10*3/uL (ref 150–400)
Potassium: 4.1 meq/L (ref 3.5–5.3)
Potassium: 4.1 meq/L (ref 3.5–5.3)
Protein, ur: 30 mg/dL — AB
RBC / HPF: NONE SEEN (ref <3)
RBC: 4.93 M/uL (ref 4.22–5.81)
RBC: 5.13 M/uL (ref 4.22–5.81)
RDW: 12.8 % (ref 11.5–15.5)
RDW: 13.3 % (ref 11.5–14.0)
Sodium: 137 meq/L (ref 135–145)
Sodium: 139 meq/L (ref 135–145)
Specific Gravity, Urine: 1.029 (ref 1.005–1.03)
T3, Total: 143 ng/dL (ref >=80.0)
TSH: 1.999 microintl units/mL (ref 0.350–4.5)
Total Bilirubin: 1.4 mg/dL — ABNORMAL HIGH (ref 0.3–1.2)
Total Bilirubin: 1.6 mg/dL — ABNORMAL HIGH (ref 0.3–1.2)
Total CHOL/HDL Ratio: 7.3
Total CHOL/HDL Ratio: 8.8
Total Protein: 7.6 g/dL (ref 6.0–8.3)
Total Protein: 8.5 g/dL — ABNORMAL HIGH (ref 6.0–8.3)
Triglycerides: 276 mg/dL — ABNORMAL HIGH (ref <150)
Triglycerides: 566 mg/dL — ABNORMAL HIGH (ref <150)
Urine Glucose: >1000 mg/dL — AB
Urobilinogen, UA: 0.2 (ref 0.0–1.0)
VLDL: 55 mg/dL — ABNORMAL HIGH (ref 0–40)
WBC, UA: NONE SEEN cells/hpf (ref <3)
WBC: 6.2 10*3/uL (ref 4.0–10.5)
WBC: 6.8 10*3/uL (ref 4.0–10.5)
pH: 5.5 (ref 5.0–8.0)

## 2011-01-20 NOTE — Miscellaneous (Signed)
  Clinical Lists Changes  Observations: Added new observation of YEARAIDSPOS: 2004  (11/11/2010 15:42)

## 2011-01-20 NOTE — Miscellaneous (Signed)
Summary: Lab orders/tkk  Clinical Lists Changes  Orders: Added new Test order of T-CBC w/Diff [phone]) - Signed Added new Test order of T-CD4SP [facility]) (CD4SP) - Signed Added new Test order of T-HIV Viral Load [phone]) - Signed Added new Test order of T-Comprehensive Metabolic Panel ([id number]) - Signed

## 2011-01-20 NOTE — Letter (Signed)
Summary: Appt. Notice  Appt. Notice   Imported By: Florinda Marker 02/11/2010 15:42:22  _____________________________________________________________________  External Attachment:    Type:   Image     Comment:   External Document

## 2011-01-20 NOTE — Miscellaneous (Signed)
Summary: clincial update/ryan white NCADAP appr til 03/21/11  Clinical Lists Changes  Observations: Added new observation of AIDSDAP: Yes 2011 (02/25/2010 8:56)

## 2011-01-20 NOTE — Progress Notes (Signed)
Summary: change abx  Phone Note Call from Patient   Caller: Patient Call For: Paulette Blanch Dam MD Summary of Call: Patient is uninsured wants to know if his abx can be changed to something else besides clindamycin need to be called in to [facility] [place]. Initial call taken by: Starleen Arms CMA,  October 22, 2010 4:45 PM  Follow-up for Phone Call        amoxicillin is cheap but he reportedly has an allegy to augmentin. So that takes out penicillin, amoxicillin. Moxifloxacin would be an alternative but it is going to be even more expensive. Azithromycin is not necessarily cheap and not great drug for tooth infection. He should see if someone has deal with cheap clindamycin. I dont know what else to do for him. Can you double check what his augmentin allergy was? I fit was diarrhea then I would give him amoxicillin rechallenge at 500mg  three times a day. or PCN VK 500mg  tid That would be cheap. Follow-up by: Acey Lav MD,  October 22, 2010 8:37 PM

## 2011-01-20 NOTE — Miscellaneous (Signed)
Summary: clinical update/ryan white NCADAP application completed  Clinical Lists Changes  Observations: Added new observation of PCTFPL: 157.07  (01/20/2010 12:14) Added new observation of HOUSEINCOME: 16109  (01/20/2010 12:14) Added new observation of FAMILYSIZE: 5  (01/20/2010 12:14) Added new observation of #CHILD<18 IN: Yes  (01/20/2010 12:14) Added new observation of FINASSESSDT: 01/20/2010  (01/20/2010 12:14)

## 2011-01-20 NOTE — Progress Notes (Signed)
Summary: Requesting Lab results   Phone Note Call from Patient   Caller: Patient Summary of Call: Pt is calling for lab results. He says he is out of town and not able to keep visit.  His last OV  with Dr Feliberto Gottron is 9-10. Advised pt to reschedule OV for follow up.  He is insisting that we give him his lab results. I will forward this message to Dr  Daiva Eves. Tomasita Morrow RN  May 22, 2010 10:25 AM   Follow-up for Phone Call        Pt was given results and told this was the last time he would be given results w/o OV.  He should schedule f/u in 3 months with labs and OV.  He will not get lab results with OV in the future. Tomasita Morrow RN  May 26, 2010 10:45 AM  Follow-up by: Tomasita Morrow RN,  May 26, 2010 10:45 AM

## 2011-01-20 NOTE — Progress Notes (Signed)
Summary: Clindamycin   Phone Note Call from Patient Call back at Home Phone [phone]   Caller: Patient Call For: Paulette Blanch Dam MD Reason for Call: Acute Illness Summary of Call: Tooth "on a post" painful and giving him a problem.  The pt. is out-of-town with his work in [place], [place] and will not be back to [place] until 1 1/2 weeks from now.  Pt. would appreciate an antibiotic being called in to the [facility] in [place], [place].  Please advise. Jennet Maduro RN  October 20, 2010 5:00 PM   Follow-up for Phone Call        If he has no insurance and augmentin allergy, then clindamycin 300mg  qid for 14 days is reasonable Follow-up by: Acey Lav MD,  October 20, 2010 8:04 PM    New/Updated Medications: CLINDAMYCIN HCL 300 MG CAPS (CLINDAMYCIN HCL) Take 1 tablet by mouth four times a day for 14 days Prescriptions: CLINDAMYCIN HCL 300 MG CAPS (CLINDAMYCIN HCL) Take 1 tablet by mouth four times a day for 14 days  #56 x 1   Entered by:   Jennet Maduro RN   Authorized by:   Acey Lav MD   Signed by:   Jennet Maduro RN on 10/22/2010   Method used:   Telephoned to ...         RxID:   [id number]  Called to [facility] in [place], [place].  [phone].  Jennet Maduro RN  October 22, 2010 3:16 PM

## 2011-01-20 NOTE — Miscellaneous (Signed)
Summary: Orders Update - labs  Clinical Lists Changes  Problems: Added new problem of ENCOUNTER FOR LONG-TERM USE OF OTHER MEDICATIONS (ICD-V58.69) Orders: Added new Test order of T-CBC w/Diff [phone]) - Signed Added new Test order of T-CD4SP [facility]) (CD4SP) - Signed Added new Test order of T-Comprehensive Metabolic Panel [phone]) - Signed Added new Test order of T-HIV Viral Load [phone]) - Signed Added new Test order of T-RPR (Syphilis) [phone]) - Signed Added new Test order of T-Lipid Profile ([id number]) - Signed     Process Orders Check Orders Results:     [facility]: ABN not required for this insurance Order queued for requisitioning for [facility]: Apr 29, 2010 12:48 PM  Tests Sent for requisitioning (Apr 29, 2010 12:48 PM):     05/05/2010: [facility] -- T-CBC w/Diff [[id number]] (signed)     05/05/2010: [facility] -- T-Comprehensive Metabolic Panel [[id number]] (signed)     05/05/2010: [facility] -- T-HIV Viral Load [phone] (signed)     05/05/2010: [facility] -- T-RPR (Syphilis) [phone] (signed)     05/05/2010: [facility] -- T-Lipid Profile [id number] (signed)

## 2011-01-20 NOTE — Progress Notes (Signed)
Summary: wants different abx called in   Phone Note Call from Patient   Caller: Patient Summary of Call: Patient wants to know if he could have doxycycline or bactrim for a gum infection, because he is allergic to penicillin, and can't afford clindamycin. 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  Initial call taken by: Starleen Arms CMA,  October 24, 2010 11:26 AM  Follow-up for Phone Call        called in doxycycline 100mg  two times a day for 20 days ok per Traci Sermon, NP Follow-up by: Starleen Arms CMA,  October 27, 2010 9:44 AM

## 2011-01-20 NOTE — Assessment & Plan Note (Signed)
Summary: F/U [MKJ]   Visit Type:  Follow-up Primary Provider:  Paulette Blanch Dam MD  CC:  follow-up visit.  History of Present Illness: 50 yo Caucasian male with HIV,Hyperlipidemia and diabetes on ritonavir boosted atazanavir along with truvada who I have not seen in nearly a year. His VL is in the low 100s. He denies misssing a dose, claims to always take antiacids well apart from reyataz. He had stopped his metformin after having so many normal glucose readings and indeed a1c had gone to 6. However in the interim weight is back up and fasting glucoase was 180. He requests medicien to help him sleep due to problmes with insomniia. He has had bilateral ear fullness recently and took his childs rx for amoxicilin with some improvment  Problems Prior to Update: 1)  Encounter For Long-term Use of Other Medications  (ICD-V58.69) 2)  Dermatitis, Allergic  (ICD-692.9) 3)  Contact Dermatitis&other Eczema Due To Plants  (ICD-692.6) 4)  Inadequate Material Resources  (ICD-V60.2) 5)  Other Anxiety States  (ICD-300.09) 6)  Hypertriglyceridemia  (ICD-272.1) 7)  Leg Edema  (ICD-782.3) 8)  Palpitations  (ICD-785.1) 9)  Unspecified Chronic Hepatitis  (ICD-571.40) 10)  Health Screening  (ICD-V70.0) 11)  Infection, Skin and Soft Tissue  (ICD-686.9) 12)  Dm, Uncomplicated, Type II  (ICD-250.00) 13)  Pneumonia, 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 Take 1 Tablet By Mouth Once A Day 2)  Norvir 100 Mg Caps (Ritonavir) .... Take 1 Capsule By Mouth Once A Day 3)  Reyataz 300 Mg Caps (Atazanavir Sulfate) .... Take 1 Capsule By Mouth Once A Day 4)  Betamethasone Valerate 0.1 % Oint (Betamethasone Valerate) .... Apply Once Daily To Lesions On Arms and Between Your Toes Once Daily For A Week 45 G Tube 5)  Claritin 10 Mg Tabs (Loratadine) .... Take 1 Tablet By Mouth Once A Day For  Itching 6)  Aspir-Low 81 Mg Tbec (Aspirin) .... Take 1 Tablet By Mouth Once A Day 7)  Pravachol 20 Mg Tabs (Pravastatin Sodium) .... Take 1 Tablet By Mouth Once A Day  Current Medications (verified): 1)  Truvada 200-300 Mg Tabs (Emtricitabine-Tenofovir) .... Take 1 Tablet By Mouth Once A Day 2)  Norvir 100 Mg Caps (Ritonavir) .... Take 1 Capsule By Mouth Once A Day 3)  Reyataz 300 Mg Caps (Atazanavir Sulfate) .... Take 1 Capsule By Mouth Once A Day 4)  Betamethasone Valerate 0.1 % Oint (Betamethasone Valerate) .... Apply Once Daily To Lesions On Arms and Between Your Toes Once Daily For A Week 45 G Tube 5)  Claritin 10 Mg Tabs (Loratadine) .... Take 1 Tablet By Mouth Once A Day For Itching 6)  Aspir-Low 81 Mg Tbec (Aspirin) .... Take 1 Tablet By Mouth Once A Day 7)  Pravachol 20 Mg Tabs (Pravastatin Sodium) .... Take 1 Tablet By Mouth Once A Day 8)  Ambien 10 Mg Tabs (Zolpidem Tartrate) .... Take 1 Tab By Mouth At Bedtime 9)  Celexa 20 Mg Tabs (Citalopram Hydrobromide) .... Take Half Tablet Daily For Two Weeks Then One Tablet Daily  Allergies: 1)  ! Augmentin   Preventive Screening-Counseling & Management  Alcohol-Tobacco     Alcohol drinks/day: 0     Smoking Status: never     Passive Smoke Exposure: no  Caffeine-Diet-Exercise     Caffeine use/day: coffee and tea     Does Patient Exercise: no  Hep-HIV-STD-Contraception     HIV Risk: no     HIV Risk Counseling: not indicated-no HIV risk noted  Safety-Violence-Falls     Seat Belt Use: yes  Comments: declined condoms      Sexual History:  currently monogamous.        Drug Use:  never.     Current Allergies (reviewed today): ! AUGMENTIN Past History:  Past Medical History: Last updated: 03/26/2008 HIV disease Pneumonia, hx of pneumocystis carinii Diabetes Hyperlipidemia--previously on a statin which was dc'd due to an elevation in lfts? presumptive mrsa skin infection  Family History: Last updated: 09/11/2009 His  mother apparently did have coronary artery disease dx in her 23s  Social History: Last updated: 11/28/2007 Occupation:truck driver Married Never Smoked  Risk Factors: Alcohol Use: 0 (10/07/2010) Caffeine Use: coffee and tea (10/07/2010) Exercise: no (10/07/2010)  Risk Factors: Smoking Status: never (10/07/2010) Passive Smoke Exposure: no (10/07/2010)  Social History: Sexual History:  currently monogamous Drug Use:  never  Review of Systems       The patient complains of headaches.  The patient denies anorexia, fever, weight loss, weight gain, vision loss, decreased hearing, hoarseness, chest pain, syncope, dyspnea on exertion, peripheral edema, prolonged cough, hemoptysis, abdominal pain, melena, hematochezia, severe indigestion/heartburn, hematuria, incontinence, genital sores, muscle weakness, suspicious skin lesions, transient blindness, difficulty walking, depression, unusual weight change, abnormal bleeding, and enlarged lymph nodes.    Vital Signs:  Patient profile:   50 year old male Height:      72 inches (182.88 cm) Weight:      244 pounds (110.91 kg) BMI:     33.21 Temp:     98.5 degrees F (36.94 degrees C) oral Pulse rate:   85 / minute BP sitting:   131 / 85  (left arm) Cuff size:   large  Vitals Entered By: Jennet Maduro RN (October 07, 2010 3:32 PM) CC: follow-up visit Is Patient Diabetic? No Pain Assessment Patient in pain? no      Nutritional Status BMI of > 30 = obese Nutritional Status Detail appetite "I can eat."  Have you ever been in a relationship where you felt threatened, hurt or afraid?No   Does patient need assistance? Functional Status Self care Ambulation Normal Comments no missed doses of rxes   Physical Exam  General:  alert.  alert and overweight-appearing.   Head:  normocephalic and atraumatic.  normocephalic and atraumatic.   Eyes:  vision grossly intact, pupils equal, and pupils round.  .   Ears:  no external deformities.  copious wax in bilateral canals no tenderness with manipulation of tragus Nose:  no external deformity.  no external deformity.   Mouth:  pharynx pink and moist, no erythema, and no exudates.   Neck:  supple.   Lungs:  normal respiratory effort, no crackles, and no wheezes.   Heart:  normal rate, regular rhythm, no murmur, no gallop, and no rub.   Abdomen:  soft, non-tender, normal bowel sounds, no distention, .  no hepatomegaly and no splenomegaly.   Neurologic:  alert & oriented X3.  alert & oriented X3.   Skin:  no rashes.   Psych:  Oriented X3, memory intact for recent and remote, and normally interactive.          Medication Adherence: 10/07/2010   Adherence to medications reviewed with patient. Counseling to provide adequate adherence provided   Prevention For Positives: 10/07/2010   Safe sex practices discussed with patient. Condoms offered.   Education Materials Provided:  10/07/2010 Safe sex practices discussed with patient. Condoms offered.                          Impression & Recommendations:  Problem # 1:  HIV DISEASE (ICD-042) recheck viral load adn cd4 claims perfect compliance Orders: T-HIV Viral Load [phone]) T-CD4PRO [facility]) (CD4PRO) Est. Patient Level IV (27035)  Diagnostics Reviewed:  HIV: CDC-defined AIDS (09/11/2009)   CD4: 530 (09/04/2010)   WBC: 5.6 (09/03/2010)   Hgb: 15.2 (09/03/2010)   HCT: 43.5 (09/03/2010)   Platelets: 214 (09/03/2010) HIV-1 RNA: 192 (09/03/2010)   HBSAg: NO (02/14/2007)  Problem # 2:  DM, UNCOMPLICATED, TYPE II (ICD-250.00) recheck hga1c with elevated bg needs restart metformin His updated medication list for this problem includes:    Aspir-low 81 Mg Tbec (Aspirin) .Marland Kitchen... Take 1 tablet by mouth once a day  Orders: T-Hgb A1C (in-house) (00938HW) Est. Patient Level IV (29937)  Labs Reviewed: Creat: 1.01 (09/03/2010)    Reviewed HgBA1c results: 6.0 (09/11/2009)  7.0 (03/10/2007)  Problem # 3:  EAR PAIN, BILATERAL  (ICD-388.70)  more of a fullness due to wax buildup. Dont think he has otitis media  Orders: Est. Patient Level IV (16967)  Problem # 4:  INSOMNIA (ICD-780.52)  try ambien His updated medication list for this problem includes:    Ambien 10 Mg Tabs (Zolpidem tartrate) .Marland Kitchen... Take 1 tab by mouth at bedtime  Orders: Est. Patient Level IV (89381)  Medications Added to Medication List This Visit: 1)  Ambien 10 Mg Tabs (Zolpidem tartrate) .... Take 1 tab by mouth at bedtime 2)  Celexa 20 Mg Tabs (Citalopram hydrobromide) .... Take half tablet daily for two weeks then one tablet daily  Other Orders: Influenza Vaccine NON MCR (01751) T-GC Probe, urine ([id number]) T-Chlamydia  Probe, urine [id number]) Future Orders: T-CD4SP ([facility]) (CD4SP) ... 01/26/2011 T-HIV Viral Load [phone]) ... 01/26/2011 T-CBC w/Diff ([id number]) ... 01/26/2011 T-Comprehensive Metabolic Panel [id number]) ... 01/26/2011 T-Lipid Profile [phone]) ... 01/26/2011 T-RPR (Syphilis) [phone]) ... 01/26/2011   Prescriptions: CELEXA 20 MG TABS (CITALOPRAM HYDROBROMIDE) take half tablet daily for two weeks then one tablet daily  #30 x 11   Entered and Authorized by:   Acey Lav MD   Signed by:   Paulette Blanch Dam MD on 10/07/2010   Method used:   Print then Give to Patient   RxID:   [id number] AMBIEN 10 MG TABS (ZOLPIDEM TARTRATE) Take 1 tab by mouth at bedtime  #30 x 4   Entered and Authorized by:   Acey Lav MD   Signed by:   Paulette Blanch Dam MD on 10/07/2010   Method used:   Print then Give to Patient   RxID:   [id number]    Influenza Vaccine    Vaccine Type: Fluvax Non-MCR    Site: right deltoid    Mfr: novartis    Dose: 0.5 ml    Route: IM    Given by: Jennet Maduro RN    Exp. Date: 03/22/2011    Lot #: [id number]  Flu Vaccine Consent Questions    Do you have a history of severe allergic reactions to this vaccine? no    Any prior history of allergic  reactions to egg and/or gelatin? no    Do you have a sensitivity to the preservative Thimersol? no    Do you have a past history of Guillan-Barre Syndrome? no    Do you currently have an acute febrile illness?  no    Have you ever had a severe reaction to latex? no    Vaccine information given and explained to patient? yes

## 2011-02-16 ENCOUNTER — Other Ambulatory Visit: Payer: Self-pay

## 2011-03-02 ENCOUNTER — Ambulatory Visit: Payer: Self-pay | Admitting: Infectious Disease

## 2011-03-04 LAB — T-HELPER CELL (CD4) - (RCID CLINIC ONLY)
CD4 % Helper T Cell: 22 % — ABNORMAL LOW (ref 33–55)
CD4 T Cell Abs: 490 uL (ref 400–2700)

## 2011-03-05 LAB — T-HELPER CELL (CD4) - (RCID CLINIC ONLY)
CD4 % Helper T Cell: 23 % — ABNORMAL LOW (ref 33–55)
CD4 T Cell Abs: 530 uL (ref 400–2700)

## 2011-03-09 ENCOUNTER — Other Ambulatory Visit: Payer: Self-pay | Admitting: Infectious Disease

## 2011-03-09 ENCOUNTER — Encounter: Payer: Self-pay | Admitting: Infectious Disease

## 2011-03-09 ENCOUNTER — Ambulatory Visit (INDEPENDENT_AMBULATORY_CARE_PROVIDER_SITE_OTHER): Payer: Self-pay | Admitting: Infectious Disease

## 2011-03-09 DIAGNOSIS — B20 Human immunodeficiency virus [HIV] disease: Secondary | ICD-10-CM

## 2011-03-09 LAB — CONVERTED CEMR LAB
ALT: 98 units/L — ABNORMAL HIGH (ref 0–53)
AST: 53 units/L — ABNORMAL HIGH (ref 0–37)
Albumin: 4.5 g/dL (ref 3.5–5.2)
Alkaline Phosphatase: 93 units/L (ref 39–117)
BUN: 13 mg/dL (ref 6–23)
Basophils Absolute: 0.1 10*3/uL (ref 0.0–0.1)
Basophils Relative: 1 % (ref 0–1)
Direct LDL: 120 mg/dL — ABNORMAL HIGH
Eosinophils Relative: 3 % (ref 0–5)
HCT: 45.5 % (ref 39.0–52.0)
Hemoglobin: 15.8 g/dL (ref 13.0–17.0)
MCHC: 34.7 g/dL (ref 30.0–36.0)
Monocytes Absolute: 0.3 10*3/uL (ref 0.1–1.0)
Neutro Abs: 1.7 10*3/uL (ref 1.7–7.7)
Platelets: 224 10*3/uL (ref 150–400)
Potassium: 4.5 meq/L (ref 3.5–5.3)
RDW: 12.9 % (ref 11.5–15.5)

## 2011-03-09 LAB — T-HELPER CELL (CD4) - (RCID CLINIC ONLY)
CD4 % Helper T Cell: 24 % — ABNORMAL LOW (ref 33–55)
CD4 T Cell Abs: 540 uL (ref 400–2700)

## 2011-03-10 LAB — T-HELPER CELL (CD4) - (RCID CLINIC ONLY)
CD4 % Helper T Cell: 24 % — ABNORMAL LOW (ref 33–55)
CD4 T Cell Abs: 530 uL (ref 400–2700)

## 2011-03-11 ENCOUNTER — Encounter (INDEPENDENT_AMBULATORY_CARE_PROVIDER_SITE_OTHER): Payer: Self-pay | Admitting: *Deleted

## 2011-03-11 LAB — HIV-1 RNA QUANT-NO REFLEX-BLD
HIV 1 RNA Quant: <20 copies/mL (ref <20)
HIV-1 RNA Quant, Log: <1.3 {Log} (ref <1.30)

## 2011-03-19 NOTE — Assessment & Plan Note (Signed)
Summary: F/U   Vital Signs:  Patient profile:   50 year old male Height:      72 inches (182.88 cm) Weight:      242.50 pounds (110.23 kg) BMI:     33.01 Temp:     98.2 degrees F (36.78 degrees C) oral Pulse rate:   79 / minute BP sitting:   148 / 89  (left arm)  Vitals Entered By: Starleen Arms CMA (March 09, 2011 9:17 AM) CC: f/u, Lipid Management Is Patient Diabetic? No Pain Assessment Patient in pain? no      Nutritional Status BMI of > 30 = obese Nutritional Status Detail nl  Does patient need assistance? Functional Status Self care Ambulation Normal   Visit Type:  Follow-up Primary Provider:  Paulette Blanch Dam MD  CC:  f/u and Lipid Management.  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 He had stopped his metformin and we had checked a hga1c which was at 7;1 but somehow nothing was done to intervent and put him back on his metformin. He continues to have intermitten palpitations which stop him at work from doing what he is doing. Can happen at any times. No other associated ssx of dyspnea or chest pain. EKG was NSR in 2010 and Echo showed normal ef and chamber size. I referred him to cardiology but he never went due to his concerns re cost. He is otherwise doing well. I spent over 45 minutes including over 50% of time in face to face counselling of the pt.  Lipid Management History:      Positive NCEP/ATP III risk factors include male age 35 years old or older, diabetes, HDL cholesterol less than 40, and family history for ischemic heart disease (females less than 42 years old).  Negative NCEP/ATP III risk factors include non-tobacco-user status, non-hypertensive, no ASHD (atherosclerotic heart disease), no prior stroke/TIA, no peripheral vascular disease, and no history of aortic aneurysm.    Problems Prior  to Update: 1)  Insomnia  (ICD-780.52) 2)  Ear Pain, Bilateral  (ICD-388.70) 3)  Encounter For Long-term Use of Other Medications  (ICD-V58.69) 4)  Dermatitis, Allergic  (ICD-692.9) 5)  Contact Dermatitis&other Eczema Due To Plants  (ICD-692.6) 6)  Inadequate Material Resources  (ICD-V60.2) 7)  Other Anxiety States  (ICD-300.09) 8)  Hypertriglyceridemia  (ICD-272.1) 9)  Leg Edema  (ICD-782.3) 10)  Palpitations  (ICD-785.1) 11)  Unspecified Chronic Hepatitis  (ICD-571.40) 12)  Health Screening  (ICD-V70.0) 13)  Infection, Skin and Soft Tissue  (ICD-686.9) 14)  Dm, Uncomplicated, Type II  (ICD-250.00) 15)  Pneumonia, 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 Take 1 Tablet By Mouth Once A Day 2)  Norvir 100 Mg Tabs (Ritonavir) .... Take 1 Tablet By Mouth Once A Day 3)  Reyataz 300 Mg Caps (Atazanavir Sulfate) .... Take 1 Capsule By Mouth Once A Day 4)  Betamethasone Valerate 0.1 % Oint (Betamethasone Valerate) .... Apply Once Daily To Lesions On Arms and Between Your Toes Once Daily For A Week 45 G Tube 5)  Claritin 10 Mg Tabs (Loratadine) .... Take 1 Tablet By Mouth Once A Day For Itching 6)  Aspir-Low 81 Mg Tbec (Aspirin) .... Take 1 Tablet By Mouth Once A Day 7)  Pravachol 20 Mg Tabs (  Pravastatin Sodium) .... Take 1 Tablet By Mouth Once A Day 8)  Ambien 10 Mg Tabs (Zolpidem Tartrate) .... Take 1 Tab By Mouth At Bedtime 9)  Celexa 20 Mg Tabs (Citalopram Hydrobromide) .... Take Half Tablet Daily For Two Weeks Then One Tablet Daily 10)  Clindamycin Hcl 300 Mg Caps (Clindamycin Hcl) .... Take 1 Tablet By Mouth Four Times A Day For 14 Days  Current Medications (verified): 1)  Truvada 200-300 Mg Tabs (Emtricitabine-Tenofovir) .... Take 1 Tablet By Mouth Once A Day 2)  Norvir 100 Mg Tabs (Ritonavir) .... Take 1 Tablet By Mouth Once A Day 3)  Reyataz 300 Mg Caps (Atazanavir  Sulfate) .... Take 1 Capsule By Mouth Once A Day 4)  Betamethasone Valerate 0.1 % Oint (Betamethasone Valerate) .... Apply Once Daily To Lesions On Arms and Between Your Toes Once Daily For A Week 45 G Tube 5)  Claritin 10 Mg Tabs (Loratadine) .... Take 1 Tablet By Mouth Once A Day For Itching 6)  Aspir-Low 81 Mg Tbec (Aspirin) .... Take 1 Tablet By Mouth Once A Day 7)  Ambien 10 Mg Tabs (Zolpidem Tartrate) .... Take 1 Tab By Mouth At Bedtime 8)  Celexa 20 Mg Tabs (Citalopram Hydrobromide) .... Take Half Tablet Daily For Two Weeks Then One Tablet Daily 9)  Metformin Hcl 500 Mg Tabs (Metformin Hcl) .... Take One Pill Two Times A Day For 2 Wks Then Two Two Times A Day 10)  Metformin Hcl 1000 Mg Tabs (Metformin Hcl) .... After Completeing The 500mg  Go To The 1000mg  Two Times A Day  Allergies: 1)  ! Augmentin  Past History:  Past Medical History: Last updated: 03/26/2008 HIV disease Pneumonia, hx of pneumocystis carinii Diabetes Hyperlipidemia--previously on a statin which was dc'd due to an elevation in lfts? presumptive mrsa skin infection  Family History: Last updated: 09/11/2009 His mother apparently did have coronary artery disease dx in her 59s  Social History: Last updated: 11/28/2007 Occupation:truck driver Married Never Smoked  Risk Factors: Alcohol Use: 0 (10/07/2010) Caffeine Use: coffee and tea (10/07/2010) Exercise: no (10/07/2010)  Risk Factors: Smoking Status: never (10/07/2010) Passive Smoke Exposure: no (10/07/2010)  Family History: Reviewed history from 09/11/2009 and no changes required. His mother apparently did have coronary artery disease dx in her 34s  Social History: Reviewed history from 11/28/2007 and no changes required. Occupation:truck driver Married Never Smoked  Review of Systems       see HPI otherwise negative on 12 pt review  Physical Exam  General:  alert.  alert and overweight-appearing.   Head:  normocephalic and atraumatic.   normocephalic and atraumatic.   Eyes:  vision grossly intact, pupils equal, and pupils round.  .   Ears:  no external deformities. copious wax in bilateral canals no tenderness with manipulation of tragus Nose:  no external deformity.  no external deformity.   Mouth:  pharynx pink and moist, no erythema, and no exudates.   Neck:  supple.   Lungs:  normal respiratory effort, no crackles, and no wheezes.   Heart:  normal rate, regular rhythm, no murmur, no gallop, and no rub.   Abdomen:  soft, non-tender, normal bowel sounds, no distention, .  no hepatomegaly and no splenomegaly.   Msk:  normal ROM.  no joint tenderness.   Extremities:  No clubbing, cyanosis, edema, or deformity noted with normal full range of motion of all joints.   Neurologic:  alert & oriented X3.  alert & oriented X3.  Medication Adherence: 03/09/2011   Adherence to medications reviewed with patient. Counseling to provide adequate adherence provided   Prevention For Positives: 03/09/2011   Safe sex practices discussed with patient. Condoms offered.   Education Materials Provided: 03/09/2011 Safe sex practices discussed with patient. Condoms offered.                          Impression & Recommendations:  Problem # 1:  HIV DISEASE (ICD-042) well controlled. Will consider change to integrase inhibitor to help with cholesterol profile The following medications were removed from the medication list:    Clindamycin Hcl 300 Mg Caps (Clindamycin hcl) .Marland Kitchen... Take 1 tablet by mouth four times a day for 14 days  Problem # 2:  HYPERTRIGLYCERIDEMIA (ICD-272.1) will check repeat fasting lipids today. He never started his statin! The following medications were removed from the medication list:    Pravachol 20 Mg Tabs (Pravastatin sodium) .Marland Kitchen... Take 1 tablet by mouth once a day  Problem # 3:  PALPITATIONS (ICD-785.1) referring to cardiology for further workup, ? monitor. will consider adding nodal blocking  agent Orders: Cardiology Referral (Cardiology)  Problem # 4:  DM, UNCOMPLICATED, TYPE II (ICD-250.00) reinstute metformin His updated medication list for this problem includes:    Aspir-low 81 Mg Tbec (Aspirin) .Marland Kitchen... Take 1 tablet by mouth once a day    Metformin Hcl 500 Mg Tabs (Metformin hcl) .Marland Kitchen... Take one pill two times a day for 2 wks then two two times a day    Metformin Hcl 1000 Mg Tabs (Metformin hcl) .Marland Kitchen... After completeing the 500mg  go to the 1000mg  two times a day  Medications Added to Medication List This Visit: 1)  Metformin Hcl 500 Mg Tabs (Metformin hcl) .... Take one pill two times a day for 2 wks then two two times a day 2)  Metformin Hcl 1000 Mg Tabs (Metformin hcl) .... After completeing the 500mg  go to the 1000mg  two times a day  Other Orders: T-CD4SP [facility]) (CD4SP) T-HIV Viral Load [phone]) T-Comprehensive Metabolic Panel [phone]) T-CBC w/Diff [id number]) T-RPR (Syphilis) [phone]) T-Lipid Profile [phone]) T-LDL Direct [id number]) Future Orders: T-CD4SP ([facility]) (CD4SP) ... 06/07/2011 T-HIV Viral Load [phone]) ... 06/07/2011 T-CBC w/Diff ([id number]) ... 06/07/2011 T-Comprehensive Metabolic Panel ([phone]) ... 06/07/2011  Lipid Assessment/Plan:      Based on NCEP/ATP III, the patient's risk factor category is "history of diabetes".  The patient's lipid goals are as follows: Total cholesterol goal is 200; LDL cholesterol goal is 100; HDL cholesterol goal is 40; Triglyceride goal is 150.  His LDL cholesterol goal has not been met.  Secondary causes for hyperlipidemia have been ruled out.  He has been counseled on adjunctive measures for lowering his cholesterol and has been provided with dietary instructions.     Patient Instructions: 1)  we will check blood work today 2)  we wil lconsider change to new regimen dependin g on blood work 3)  we will start metformin 4)  I would like you to see [facility] for workjup of your  palpitaitons 5)  I would also like you plugged into Donna riley in [facility] 6)  Rtc in 4 months time  Prescriptions: AMBIEN 10 MG TABS (ZOLPIDEM TARTRATE) Take 1 tab by mouth at bedtime  #30 x 4   Entered and Authorized by:   Acey Lav MD   Signed by:   Paulette Blanch Dam MD on 03/09/2011   Method used:   Print then Give  to Patient   RxID:   [id number] AMBIEN 10 MG TABS (ZOLPIDEM TARTRATE) Take 1 tab by mouth at bedtime  #30 x 4   Entered and Authorized by:   Acey Lav MD   Signed by:   Paulette Blanch Dam MD on 03/09/2011   Method used:   Print then Give to Patient   RxID:   [id number] CELEXA 20 MG TABS (CITALOPRAM HYDROBROMIDE) take half tablet daily for two weeks then one tablet daily  #30 x 11   Entered and Authorized by:   Acey Lav MD   Signed by:   Paulette Blanch Dam MD on 03/09/2011   Method used:   Electronically to        [facility] (retail)       [address]       [place], [place]  [address]       Ph: [phone] or [phone]       Fax: [phone]   RxID:   [id number] METFORMIN HCL 1000 MG TABS (METFORMIN HCL) after completeing the 500mg  go to the 1000mg  two times a day  #60 x 11   Entered and Authorized by:   Acey Lav MD   Signed by:   Paulette Blanch Dam MD on 03/09/2011   Method used:   Electronically to        [facility] (retail)       [address]       [place], [place]  [address]       Ph: [phone] or [phone]       Fax: [phone]   RxID:   [id number] METFORMIN HCL 500 MG TABS (METFORMIN HCL) take one pill two times a day for 2 wks then two two times a day  #120 x 0   Entered and Authorized by:   Acey Lav MD   Signed by:   Paulette Blanch Dam MD on 03/09/2011   Method used:   Electronically to        [facility] (retail)       [address]       [place], [place]  [address]       Ph:  [phone] or [phone]       Fax: [phone]   RxID:   [id number]    Orders Added: 1)  T-CD4SP [facility]) [CD4SP] 2)  T-HIV Viral Load [id number] 3)  T-Comprehensive Metabolic Panel [[id number]] 4)  T-CBC w/Diff [[id number]] 5)  T-RPR (Syphilis) [phone] 6)  T-Lipid Profile [[id number]] 7)  T-LDL Direct [[id number]] 8)  Cardiology Referral [Cardiology] 9)  T-CD4SP [facility]) [CD4SP] 10)  T-HIV Viral Load [id number] 11)  T-CBC w/Diff [[id number]] 12)  T-Comprehensive Metabolic Panel [[id number]]    Prevention & Chronic Care Immunizations   Influenza vaccine: Fluvax Non-MCR  (10/07/2010)    Tetanus booster: Not documented    Pneumococcal vaccine: Pneumovax  (06/18/2008)  Colorectal Screening   Hemoccult: Not documented    Colonoscopy: Not documented  Other Screening   PSA: Not documented   Smoking status: never  (10/07/2010)  Diabetes Mellitus   HgbA1C: 7.2  (10/08/2010)    Eye exam: Not documented    Foot exam: Not documented   High risk foot: Not documented   Foot care education: Not documented    Urine microalbumin/creatinine ratio:  37.7  (09/11/2009)  Lipids   Total Cholesterol: 210  (05/05/2010)   LDL: See Comment mg/dL  (04/54/0981)   LDL Direct: Not documented   HDL: 32  (05/05/2010)   Triglycerides: 402  (05/05/2010)    SGOT (AST): 106  (09/03/2010)   SGPT (ALT): 148  (09/03/2010) CMP ordered    Alkaline phosphatase: 100  (09/03/2010)   Total bilirubin: 0.7  (09/03/2010)  Self-Management Support :    Diabetes self-management support: Not documented    Lipid self-management support: Not documented

## 2011-03-19 NOTE — Miscellaneous (Signed)
  Clinical Lists Changes 

## 2011-03-27 LAB — T-HELPER CELL (CD4) - (RCID CLINIC ONLY): CD4 % Helper T Cell: 23 % — ABNORMAL LOW (ref 33–55)

## 2011-03-30 LAB — T-HELPER CELL (CD4) - (RCID CLINIC ONLY): CD4 T Cell Abs: 480 uL (ref 400–2700)

## 2011-04-13 ENCOUNTER — Telehealth: Payer: Self-pay | Admitting: *Deleted

## 2011-04-13 NOTE — Telephone Encounter (Signed)
RN reviewed pt refills for this medication, Ambien, in [facility] EMR.  The pt had received an rx on 03/09/2011 from Dr. Daiva Eves which was sent to the [facility] in [place].  RN phoned the [facility] in [place] to determine pt usage of Ambien.  The pharmacist checked in their system for pt refills.  Determined that the pt was obtaining refills from both [facility].  RN did not authorize refills on request from the [employer]  Pharmacist agreed with this plan.  Jennet Maduro, RN.

## 2011-04-15 ENCOUNTER — Other Ambulatory Visit: Payer: Self-pay | Admitting: *Deleted

## 2011-04-15 DIAGNOSIS — G4700 Insomnia, unspecified: Secondary | ICD-10-CM

## 2011-04-15 MED ORDER — ZOLPIDEM TARTRATE 10 MG PO TABS: 10.0000 mg | ORAL_TABLET | Freq: Every evening | ORAL | Status: DC | PRN

## 2011-04-17 ENCOUNTER — Other Ambulatory Visit: Payer: Self-pay | Admitting: *Deleted

## 2011-04-17 DIAGNOSIS — B20 Human immunodeficiency virus [HIV] disease: Secondary | ICD-10-CM

## 2011-04-17 MED ORDER — RITONAVIR 100 MG PO TABS: 100.0000 mg | ORAL_TABLET | Freq: Every day | ORAL | Status: DC

## 2011-04-17 MED ORDER — ATAZANAVIR SULFATE 300 MG PO CAPS: 300.0000 mg | ORAL_CAPSULE | Freq: Every day | ORAL | Status: DC

## 2011-04-17 MED ORDER — EMTRICITABINE-TENOFOVIR DF 200-300 MG PO TABS: 1.0000 | ORAL_TABLET | Freq: Every day | ORAL | Status: DC

## 2011-05-05 NOTE — Discharge Summary (Signed)
NAME:  Ricardo [name], Ricardo [name] NO.:  [id number]   MEDICAL RECORD NO.:  [id number]          PATIENT TYPE:  INP   LOCATION:  2023                         FACILITY:  [facility]   PHYSICIAN:  Madelaine Etienne, MD         DATE OF BIRTH:  04-Jan-1961   DATE OF ADMISSION:  03/26/2008  DATE OF DISCHARGE:  03/27/2008                               DISCHARGE SUMMARY   DISCHARGE DIAGNOSES:  1. Intermittent palpitations without documented evidence of      arrhythmia, etiology unclear.  2. Patient-reported bilateral lower extremity edema, right greater      than left.  3. Immunodeficiency virus disease, last CD4 584 and viral load 237 in      March 2009.  4. Type 2 diabetes mellitus 5.8% in April 2009.  5. Chronic hepatitis of unknown etiology, query nonalcoholic      steatohepatitis.  6. Hyperlipidemia.  7. Hypertriglyceridemia.  8. History of Pneumocystis jiroveci pneumonia in 2005.  9. Remote history of tonsillectomy.   DISCHARGE MEDICATIONS:  1. Metformin 500 mg by mouth twice daily.  2. Truvada 1 tablet by mouth daily.  3. Norvir 100 mg by mouth daily.  4. Clonazepam 0.5 mg by mouth daily.  5. Reyataz 300 mg by mouth daily.  6. Tylenol 650 mg by mouth every 8 hours as needed.   NOTE:  The patient was instructed to avoid the use of BC and Goody's powders.  Given a high content of aspirin.  He is also to be given a pair of TED  hose to wear at home and after discharge.   CONSULTATIONS:  None.   PROCEDURES:  1. Plain film of the chest on March 26, 2008 revealed no acute      cardiopulmonary disease.  2. Transthoracic echocardiogram on March 26, 2008 revealed overall      normal left ventricular systolic function of this kidney, left      ventricular systolic function, with an ejection fraction of 55-60%.      There were no left ventricular regional wall motion abnormalities.      There was no significant valvular regurgitation reported.  Systolic      pulmonary artery pressure was  estimated to be 23 mmHg.  3. Electrocardiograms performed during this admission revealed no      objective evidence of arrhythmia.  Tracings revealed normal sinus      rhythm with a rate in the 70s with normal intervals and axis.  4. Right lower extremity Dopplers performed due to the patient      complaints of asymmetric edema, revealed no evidence of DVT,      superficial thrombosis, or Baker's cyst.   ADMISSION HISTORY:  [name] is a 50 year old Caucasian man with a  past medical history significant for HIV and type 2 diabetes mellitus,  who presented to the [facility] on  March 26, 2008 for a routine appointment.  During that visit with Dr. Daiva Eves, the patient endorsed intermittent palpitations for approximately 6  months prior to admission.  The patient reported that these episodes of  palpitation lasted approximately 30 seconds, and were not associated  with any chest pain, shortness of breath, dizziness, nausea, vomiting,  near-syncope, or syncope.  The patient reported no aggravating factors  for these palpitations, as they could come on at rest just as easily as  they could come on during exertion.  The patient is somewhat concerned  about these palpitations given that the etiology is unknown, but reports  they are completely asymptomatic.  They occur approximately once per  week.  During the patient's visit with Dr. Daiva Eves, he also endorsed  intermittent bilateral lower extremity swelling, right greater than left  approximately 1 year in duration.  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  There are no aggravating factors  for the swelling, except for being on his feet during the day.  The  swelling is relieved by elevating his legs at the end of the day.  He  has no known history of cancer, hemoptysis, or venous thrombi.  The   patient reports no history of fevers, chills, cough, or productive  sputum.   ADMISSION PHYSICAL EXAMINATION:  VITAL SIGNS:  On admission were:  Temperature 98.2 degrees Fahrenheit, blood pressure 141/88, pulse 80,  respirations rate 20, and oxygen saturation 97% on room air.  GENERAL:  Awake, alert, and oriented x3, in no acute distress.  Appears  well.  HEENT:  Pupils equal, round, regular, reactive to light accommodation,  extraocular movements intact, oropharynx clear and moist, nares patent.  NECK:  Supple without lymphadenopathy, thyromegaly, or JVD.  RESPIRATORY:  Clear to auscultation bilaterally without wheezes, rales,  or rhonchi.  CARDIOVASCULAR:  Regular rate and rhythm.  Normal S1, S2 with no  murmurs, rubs or gallops.  GASTROINTESTINAL:  Soft, nontender, nondistended with positive bowel  sounds.  EXTREMITIES:  No edema or cyanosis.  Right mid calf measured at 38 cm.  Left mid calf measured at 38 cm.  No erythema or warmth.  The patient  endorses edema, though numbness objectively present.  SKIN:  Normal exam.  NEUROLOGIC:  Cranial nerves II thorough XII intact.  Motor strength 5/5  in all extremities.  Sensation grossly intact.  Cerebellar functions  intact.  Gait normal.  PSYCH:  Oriented x3.  Cooperative.   ADMISSION LABS:  Laboratory studies on the day of admission revealed:  Sodium 137, potassium 4.0, chloride 104, bicarbonate 26, BUN 10,  creatinine 0.90, and glucose 105.  Total bilirubin 2.0, white blood cell  count 5.8, hemoglobin 15.4, hematocrit 44.2, platelet count 294, MCV  90.7, and RDW 12.8.  Pro-time was normal at 12.7 seconds with an INR of  0.9.  PTT was 26 seconds.  D-dimer was negative at less than 0.22.  Cardiac enzymes were negative x3 sets.  Urine drug screen was negative.  Hemoglobin A1c was 5.8%.  TSH was 2.307 and free thyroxine (T4) was  1.10, both normal.  Chest x-ray and EKGs are as described above.   HOSPITAL COURSE:  1. Palpitations.   Pain, shortness of breath, syncope, dizziness,      nausea, or vomiting.  He is somewhat concerned about these      palpitations even that they occur approximately weekly.  EKG, so      given the low pretest probability of pulmonary embolus, further      workup such as CT angiography was not undertaken.  A transthoracic  echocardiogram was done as described above and was normal.  Given      the negative workup so far including for acute myocardial      infarction and low pretest probability for pulmonary embolus, as      well as normal thyroid studies, we will plan to discharge the      patient home without intervention.  The nature of these palpitation      is unclear.  Given the patient's ongoing concern for his      palpitations, I have made him an appointment to see Dr. Myrtis Ser at      [facility] as an outpatient for [facility] monitoring to      further characterize these palpitations.  2. Leg swelling.  The patient reports bilateral leg swelling,      intermittent, usually right greater than left, for approximately      one year.  He denies any associated symptoms such as erythema,      warmth, or pain.  In spite of the patient's reports of leg      swelling, no edema was objectively observed during this admission.      If in fact he does have intermittent leg edema, then we feel it is      most likely due to venous insufficiency given negative Dopplers for      DVT, lack of evidence of nephrotic syndrome, normal albumin, and      normal cardiac function by 2D echocardiography.  Though I doubt he      has significant proteinuria, we will check urinalysis and a protein      to creatinine ratio to estimate 24-hour protein excretion.  If he      does have some element of proteinuria, then this can be addressed      as an outpatient.  For now, we will have the patient fit a pair of      compression stockings and provide these to him to take home at      discharge.  He will  follow up in the outpatient clinic to report on      his symptoms to see if the swelling has improved with compression      stockings.  3. HIV disease.  The patient's last CD4 count was 584, at goal.  We      continued that the patient on his home medications during this      admission.  He will be discharged on the same medications on which      he was admitted.  He will follow up as an outpatient with Dr. Daiva Eves in clinic.  4. Type 2 diabetes mellitus.  Well-controlled by hemoglobin A1c, which      was 5.8%.  The patient's metformin was held during this admission      given that was unclear.  His imaging studies will be needed.  He      was controlled well on Lantus and sliding scale insulin, and will      be restarted on metformin at discharge.  5. Hyperlipidemia/hypertriglyceridemia.  Last reported triglycerides      were above 400.  The fasting lipid profile was not repeated during      this admission secondary to the patient having eaten at the day of      discharge, making this result somewhat unreliable.  He is reported      that history of intolerance to statins,  though the nature of his      reaction is unclear.  We would consider starting him on niacin as      an outpatient, but we will defer this treatment decision to Dr. Daiva Eves.  6. Disposition:  The patient will be discharged this evening with      compression stockings for his lower extremity edema, and a follow      up appointment with Dr. Myrtis Ser at [facility] for [facility]      monitoring.   DISCHARGE LABS:  Laboratory studies on the day of discharge were:  Sodium 137, potassium 4.1, chloride 105, bicarbonate 24, BUN 12,  creatinine 0.79, glucose 100, and calcium 8.8.  White blood cell count  5.5, hemoglobin 14.2, and platelet count 276.  Cardiac enzymes as  mentioned above were negative.  As of the time of this dictation, an  acute hepatitis panel and a urinalysis with urine protein to creatinine   ratio are pending, but are anticipated to be negative.   DISCHARGE VITALS:  Vital signs on the day of discharge were:  Temperature 98.1 degrees Fahrenheit, blood pressure 129/73, pulse 90,  respirations 18, oxygen saturation 96% on room air.   DISPOSITION AND FOLLOW-UP:  The patient is to follow up with Dr. Myrtis Ser at  [facility] on April 29 at 2:30 p.m., to discuss palpitations and  the utility of the Holter/event monitoring.  He will follow up with Dr.  Daiva Eves and the [facility] on June 05, 2008 at 2:30 p.m.      Madelaine Etienne, MD  Electronically Signed     JH/MEDQ  D:  03/27/2008  T:  03/28/2008  Job:  [id number]   cc:   Luis Abed, MD, [facility]  Internal Medicine Outpatient [facility]

## 2011-05-08 NOTE — Discharge Summary (Signed)
NAME:  Ricardo [name], Ricardo [name] NO.:  [id number]   MEDICAL RECORD NO.:  [id number]                   PATIENT TYPE:  INP   LOCATION:  5711                                 FACILITY:  [facility]   PHYSICIAN:  Donald Pore, MD                    DATE OF BIRTH:  04-26-1961   DATE OF ADMISSION:  10/18/2003  DATE OF DISCHARGE:  10/22/2003                                 DISCHARGE SUMMARY   REASON FOR HOSPITALIZATION:  This is a 50 year old male recently diagnosed  with HIV in August of 2004, who presented to Infectious Disease Clinic in  [place] and was referred to [facility] with symptoms of pneumonia.  He was referred to [facility] by Dr. Merilynn Finland for further  evaluation.  Prior CD4 count 100, viral load greater than 100,000.   MEDICAL PROBLEM LIST:  1. Cough, shortness of breath, fever to 102 - probable pneumonia.  2. HIV seropositive.   STUDIES:  Two-view AP and lateral chest impression and findings consistent  with pneumonia throughout both lungs, left greater than right; pneumocystis  is certainly a possibility.  The date of this study was October 18, 2003.   LABS ON ADMISSION:  CBC:  White count 6.4, hemoglobin 13.3, hematocrit 37.4,  platelet count 245.  CMP normal with the exception of an elevated SGOT of  54, and glucose elevated at 125, albumin decreased at 3, elevated LDH of  339.   HOSPITAL COURSE:  1. Pneumonia:  Patient was started on IV Bactrim at 350 mg q.6h., cultures     were sent for AFB, fungal culture, as well as for Coccidioides. Patient     was started on prednisone 40 mg b.i.d.  The patient's temperature on     admission was 100.8.  On therapy, he rapidly defervesced and responded     well to therapy.  On October 21, 2003, the patient was switched to p.o.     Bactrim which he tolerated well.  Samples for sputum culture for acid-     fast bacilli, fungal cultures, and pneumocystis cultures were reported in     the laboratory  as not being received after patient had been started on     therapy. The patient was found to be negative for Coccidioides antibody.     Due to the patient's rapid response and defervescence on IV Bactrim and     prednisone, it was felt likely that the patient had PCP.  2. HIV/AIDS:  The patient was given the hepatitis A vaccine, a Pneumovax,     and influenza vaccine for prophylaxis during his admission.  ID was     consulted, and the patient will be started on HAART therapy after     followup with Dr. Ninetta Lights in [place].   DISCHARGE MEDICATIONS:  1. Bactrim Double Strength 2 tablets by mouth four times a day.  2. Prednisone 20 mg, 2  tablets by mouth two times per day for 1 day, then 2     tablets by mouth once daily for 5 days, then 1 tablet by mouth once daily     for 12 days.  3. Diflucan - continue as before.  4. Aspirin - continue as before.   DISCHARGE INSTRUCTIONS:  The patient is instructed that he may return to  work on October 31, 2003. The patient is scheduled for a followup chest x-  ray on November 01, 2003, and for followup with Dr. Ninetta Lights at the ID clinic  at 11:10 a.m. in [place] on November 01, 2003.  The patient was also  instructed that he should return to the hospital if his condition worsens  prior to his followup appointment.                                                Donald Pore, MD    HP/MEDQ  D:  11/09/2003  T:  11/10/2003  Job:  [id number]

## 2011-05-08 NOTE — Discharge Summary (Signed)
NAME:  Ricardo [name], Ricardo [name] NO.:  [id number]   MEDICAL RECORD NO.:  [id number]                   PATIENT TYPE:  INP   LOCATION:  6713                                 FACILITY:  [facility]   PHYSICIAN:  Delbert Harness, MD              DATE OF BIRTH:  [birth year]   DATE OF ADMISSION:  12/01/2003  DATE OF DISCHARGE:  12/07/2003                                 DISCHARGE SUMMARY   DISCHARGE MEDICATIONS:  1. Rayataz 400 mg p.o. daily.  2. Truvada 1 tab p.o. daily.  3. Bactrim elixir 40 cc. b.i.d.  4. Diflucan 100 mg weekly.   HOSPITAL FOLLOW UP:  December 23 at 4:30 p.m. with Dr. Roxan Hockey of  infectious disease.   PROCEDURE:  Chest x-ray December 11,2004, impression:  Patchy air space  disease of left mid and lower lobe and right basilar area, decreased from  previous chest x-ray.   DISCHARGE DIAGNOSES:  1. 042.  2. Pneumocystis carinii pneumonia.  3. Hyponatremia.  4. Elevated liver function tests.  5. Adverse reaction to Combivir and Kaletra, including nausea and vomiting.   CONSULTATIONS:  Dr. Cliffton Asters of infectious disease.   HISTORY OF PRESENT ILLNESS:  A 50 year old white male with 042, presented  with shortness of breath x4-5 days.  The patient reported his shortness of  breath had gradually worsened and it was associated with nonproductive  cough, fevers and chills.  The patient also had some nausea and vomited  while coughing.  Patient last vomited the evening prior to admission,  reports he has had emesis 3-4 times over the previous week, also complains  of right calf cramps x4-5 days and back pain with deep inspiration.  The  patient reports his shortness of breath is worse when walking or even when  taking a bath.   REVIEW OF SYSTEMS:  Positive for fevers, chills, weight loss of 8 pounds,  fatigue, chest pain with deep breaths, dyspnea, edema of the right forefoot,  dyspnea on exertion, cough nonproductive, wheezing, nausea and  vomiting,  anorexia, weakness and anxiety.   ADMISSION LABORATORY DATA:  Sodium 128, potassium 4.5, chloride 104, bicarb  20, BUN 11, creatinine 1.0, glucose 96, bilirubin 0.9, alkaline phosphatase  218, AST 202, ALT 73, protein 7.6, albumin 2.4, calcium 7.6.  WBC 7.2,  hemoglobin 12.5, hematocrit 36.8, platelets 198,000.  LDH 537.   HOSPITAL COURSE:  DIAGNOSES #1 - PNEUMOCYSTIC CARINII PNEUMONIA:  CD4 count  less than 200 and the fact the patient had been recently hospitalized with  likely PCP.  This was the prime concern.  Other considerations included TB,  MAC, fungal lung infections, bacterial and atypical pneumonias.  Orders were  written for sputum to be sent for AFB, PCP and fungal culture.  Unfortunately, I do not believe this was done on this hospitalization.  PPD  was placed, once read out as negative.   The patient was empirically started on IV Bactrim and Avelox.  Fevers  defervesced over hospital course, shortness of breath improved per patient  and his oxygen saturations were good.  Once patient's other issues  concerning nausea and vomiting, and difficulty swallowing were resolved, the  patient was switched to Bactrim p.o. elixir.  The patient tolerated the  Bactrim elixir well and was sent home on the dosage noted above.  Blood  cultures were negative x2.   DIAGNOSES #2 - 042:  The patient's CD4 count while in-hospital was found to  be that of 190.  The patient was continued on his Combivir and Kaletra along  with Diflucan.  The patient had difficulty tolerating the regimen of  Combivir and Kaletra and this appeared to be causing nausea and vomiting.  The patient had difficulty swallowing the large sized capsules of Kaletra.  He was changed to Kaletra liquid, however, he still did not tolerate this  regimen.  HIV medicines were stopped and the patient had resolution of  nausea and vomiting.  On discharge, the patient was given prescription to  start new HIV heart  therapy, including Rayataz 400 mg daily and Truvada 1  tab daily.  The patient will follow up with Dr. Roxan Hockey of infectious  disease.  Also, the patient was continued on Diflucan 100 mg each week.   DIAGNOSES #3 - HYPONATREMIA:  Likely secondary to dehydration from increased  temperatures and vomiting.  Orthostatics were checked and were normal.  Patient was repleted with normal saline.  Sodium levels trended towards  normalization.   DIAGNOSES #4 -     NAUSEA AND VOMITING:  This is attributed to intolerance  of Combivir and Kaletra.  Once his medications were held, the nausea and  vomiting resolved on its own.  Patient was treated symptomatically with  Phenergan and received IV fluids as needed.  Also, the patient was placed on  Protonix while in-house.   DISCHARGE LABORATORY DATA:  Sodium 131, potassium 4.4, chloride 102, bicarb  31, glucose 106, BUN 6, creatinine 1.0, calcium 8.1. WBC 5.4, hemoglobin  12.7, hematocrit 36.5, platelets 261,000.                                                Delbert Harness, MD    JK/MEDQ  D:  01/15/2004  T:  01/15/2004  Job:  [id number]

## 2011-08-05 ENCOUNTER — Other Ambulatory Visit (INDEPENDENT_AMBULATORY_CARE_PROVIDER_SITE_OTHER): Payer: Self-pay

## 2011-08-05 ENCOUNTER — Ambulatory Visit: Payer: Self-pay | Admitting: Infectious Disease

## 2011-08-05 ENCOUNTER — Ambulatory Visit: Payer: Self-pay

## 2011-08-05 DIAGNOSIS — B20 Human immunodeficiency virus [HIV] disease: Secondary | ICD-10-CM

## 2011-08-05 LAB — COMPLETE METABOLIC PANEL WITH GFR
AST: 55 U/L — ABNORMAL HIGH (ref 0–37)
Alkaline Phosphatase: 83 U/L (ref 39–117)
BUN: 13 mg/dL (ref 6–23)
Calcium: 9.1 mg/dL (ref 8.4–10.5)
Creat: 0.78 mg/dL (ref 0.50–1.35)
GFR, Est African American: >60 mL/min (ref >60)
Glucose, Bld: 141 mg/dL — ABNORMAL HIGH (ref 70–99)
Potassium: 4 mEq/L (ref 3.5–5.3)
Total Bilirubin: 1.1 mg/dL (ref 0.3–1.2)

## 2011-08-05 LAB — CBC WITH DIFFERENTIAL/PLATELET
Basophils Absolute: 0.1 10*3/uL (ref 0.0–0.1)
Basophils Relative: 2 % — ABNORMAL HIGH (ref 0–1)
Eosinophils Absolute: 0.2 10*3/uL (ref 0.0–0.7)
Eosinophils Relative: 3 % (ref 0–5)
HCT: 43.4 % (ref 39.0–52.0)
MCH: 32.7 pg (ref 26.0–34.0)
MCHC: 36.2 g/dL — ABNORMAL HIGH (ref 30.0–36.0)
Monocytes Absolute: 0.5 10*3/uL (ref 0.1–1.0)
Monocytes Relative: 10 % (ref 3–12)
Neutro Abs: 1.5 10*3/uL — ABNORMAL LOW (ref 1.7–7.7)
RDW: 12.8 % (ref 11.5–15.5)

## 2011-08-07 LAB — HIV-1 RNA QUANT-NO REFLEX-BLD
HIV 1 RNA Quant: <20 copies/mL (ref <20)
HIV-1 RNA Quant, Log: <1.3 {Log} (ref <1.30)

## 2011-08-19 ENCOUNTER — Ambulatory Visit: Payer: Self-pay | Admitting: Infectious Disease

## 2011-09-07 ENCOUNTER — Ambulatory Visit: Payer: Self-pay | Admitting: Infectious Disease

## 2011-09-14 LAB — T-HELPER CELL (CD4) - (RCID CLINIC ONLY): CD4 T Cell Abs: 580

## 2011-09-15 LAB — CBC
Hemoglobin: 15.4
MCHC: 34.8
MCHC: 36.1 — ABNORMAL HIGH
MCV: 90.7
RBC: 4.32
RBC: 4.87
WBC: 5.5

## 2011-09-15 LAB — CARDIAC PANEL(CRET KIN+CKTOT+MB+TROPI)
Relative Index: 1
Relative Index: 1.1
Total CK: 170
Total CK: 219
Troponin I: 0.01
Troponin I: 0.01

## 2011-09-15 LAB — CK TOTAL AND CKMB (NOT AT ARMC): CK, MB: 2.6

## 2011-09-15 LAB — COMPREHENSIVE METABOLIC PANEL
ALT: 88 — ABNORMAL HIGH
BUN: 10
CO2: 26
Calcium: 9.4
GFR calc non Af Amer: >60
Glucose, Bld: 105 — ABNORMAL HIGH
Sodium: 137

## 2011-09-15 LAB — DIFFERENTIAL
Basophils Relative: 1
Eosinophils Absolute: 0.2
Lymphs Abs: 2.7
Neutrophils Relative %: 42 — ABNORMAL LOW

## 2011-09-15 LAB — BASIC METABOLIC PANEL
Calcium: 8.8
Creatinine, Ser: 0.79
GFR calc Af Amer: >60

## 2011-09-15 LAB — URINE CULTURE
Colony Count: NO GROWTH
Culture: NO GROWTH

## 2011-09-15 LAB — RAPID URINE DRUG SCREEN, HOSP PERFORMED
Barbiturates: NOT DETECTED
Benzodiazepines: NOT DETECTED
Cocaine: NOT DETECTED

## 2011-09-15 LAB — T4, FREE: Free T4: 1.1

## 2011-09-15 LAB — HEPATITIS PANEL, ACUTE: Hep B C IgM: NEGATIVE

## 2011-09-15 LAB — D-DIMER, QUANTITATIVE: D-Dimer, Quant: <0.22

## 2011-09-15 LAB — TSH: TSH: 2.307

## 2011-09-15 LAB — PROTEIN / CREATININE RATIO, URINE: Protein Creatinine Ratio: 0.15

## 2011-09-25 ENCOUNTER — Other Ambulatory Visit: Payer: Self-pay | Admitting: *Deleted

## 2011-09-25 ENCOUNTER — Telehealth: Payer: Self-pay | Admitting: *Deleted

## 2011-09-25 DIAGNOSIS — B999 Unspecified infectious disease: Secondary | ICD-10-CM

## 2011-09-25 MED ORDER — AZITHROMYCIN 250 MG PO TABS: ORAL_TABLET | ORAL | Status: AC

## 2011-09-25 NOTE — Telephone Encounter (Signed)
Wife called asking for meds for her spouse's c/o PNA. He has not gone to a md. Has not been here. Multiple no shows. He is due back in [place] on the 20th of this month. He has no insurance. He went to an ED & was turned away due to lack of insurance per wife. Has appt here on the 24th. Told her he can call the 22nd & see if we have an appt for him to be seen sooner

## 2011-09-25 NOTE — Telephone Encounter (Signed)
Spoke with md who authorized a Z pack 500mg  then 250 x 4 days. Called pt back at [phone]

## 2011-10-01 LAB — T-HELPER CELL (CD4) - (RCID CLINIC ONLY): CD4 % Helper T Cell: 17 — ABNORMAL LOW

## 2011-10-14 ENCOUNTER — Ambulatory Visit (HOSPITAL_COMMUNITY)
Admission: RE | Admit: 2011-10-14 | Discharge: 2011-10-14 | Disposition: A | Discharge status: Home / Self Care | Payer: Self-pay | Source: Ambulatory Visit | Attending: Infectious Disease | Admitting: Infectious Disease

## 2011-10-14 ENCOUNTER — Ambulatory Visit (INDEPENDENT_AMBULATORY_CARE_PROVIDER_SITE_OTHER): Payer: Self-pay | Admitting: Infectious Disease

## 2011-10-14 ENCOUNTER — Encounter: Payer: Self-pay | Admitting: Infectious Disease

## 2011-10-14 VITALS — BP 138/92 | HR 81 | Temp 98.3°F | Wt 241.0 lb

## 2011-10-14 DIAGNOSIS — R002 Palpitations: Secondary | ICD-10-CM

## 2011-10-14 DIAGNOSIS — I517 Cardiomegaly: Secondary | ICD-10-CM | POA: Insufficient documentation

## 2011-10-14 DIAGNOSIS — E119 Type 2 diabetes mellitus without complications: Secondary | ICD-10-CM

## 2011-10-14 DIAGNOSIS — R059 Cough, unspecified: Secondary | ICD-10-CM

## 2011-10-14 DIAGNOSIS — B20 Human immunodeficiency virus [HIV] disease: Secondary | ICD-10-CM

## 2011-10-14 DIAGNOSIS — R9431 Abnormal electrocardiogram [ECG] [EKG]: Secondary | ICD-10-CM | POA: Insufficient documentation

## 2011-10-14 DIAGNOSIS — G4700 Insomnia, unspecified: Secondary | ICD-10-CM

## 2011-10-14 DIAGNOSIS — Z23 Encounter for immunization: Secondary | ICD-10-CM

## 2011-10-14 DIAGNOSIS — R05 Cough: Secondary | ICD-10-CM | POA: Insufficient documentation

## 2011-10-14 LAB — CBC WITH DIFFERENTIAL/PLATELET
Basophils Absolute: 0 10*3/uL (ref 0.0–0.1)
Eosinophils Relative: 4 % (ref 0–5)
Lymphocytes Relative: 43 % (ref 12–46)
Lymphs Abs: 2.1 10*3/uL (ref 0.7–4.0)
MCV: 91.3 fL (ref 78.0–100.0)
Neutro Abs: 2.1 10*3/uL (ref 1.7–7.7)
Neutrophils Relative %: 44 % (ref 43–77)
Platelets: 241 10*3/uL (ref 150–400)
RBC: 4.81 MIL/uL (ref 4.22–5.81)
WBC: 4.8 10*3/uL (ref 4.0–10.5)

## 2011-10-14 LAB — COMPLETE METABOLIC PANEL WITH GFR
ALT: 85 U/L — ABNORMAL HIGH (ref 0–53)
AST: 48 U/L — ABNORMAL HIGH (ref 0–37)
CO2: 21 mEq/L (ref 19–32)
Calcium: 9 mg/dL (ref 8.4–10.5)
Chloride: 106 mEq/L (ref 96–112)
GFR, Est African American: >90 mL/min (ref >90)
Potassium: 4 mEq/L (ref 3.5–5.3)
Sodium: 137 mEq/L (ref 135–145)
Total Protein: 7.3 g/dL (ref 6.0–8.3)

## 2011-10-14 LAB — HEMOGLOBIN A1C: Mean Plasma Glucose: 140 mg/dL — ABNORMAL HIGH (ref <117)

## 2011-10-14 MED ORDER — RITONAVIR 100 MG PO TABS: 100.0000 mg | ORAL_TABLET | Freq: Every day | ORAL | Status: DC

## 2011-10-14 MED ORDER — METFORMIN HCL 1000 MG PO TABS: 1000.0000 mg | ORAL_TABLET | Freq: Two times a day (BID) | ORAL | Status: DC

## 2011-10-14 MED ORDER — ZOLPIDEM TARTRATE 10 MG PO TABS: 10.0000 mg | ORAL_TABLET | Freq: Every evening | ORAL | Status: DC | PRN

## 2011-10-14 MED ORDER — CITALOPRAM HYDROBROMIDE 20 MG PO TABS: 20.0000 mg | ORAL_TABLET | Freq: Every day | ORAL | Status: DC

## 2011-10-14 MED ORDER — ATAZANAVIR SULFATE 300 MG PO CAPS: 300.0000 mg | ORAL_CAPSULE | Freq: Every day | ORAL | Status: DC

## 2011-10-14 MED ORDER — ATENOLOL 50 MG PO TABS: 50.0000 mg | ORAL_TABLET | Freq: Every day | ORAL | Status: DC

## 2011-10-14 MED ORDER — PRAVASTATIN SODIUM 20 MG PO TABS: 20.0000 mg | ORAL_TABLET | Freq: Every day | ORAL | Status: DC

## 2011-10-14 MED ORDER — EMTRICITABINE-TENOFOVIR DF 200-300 MG PO TABS: 1.0000 | ORAL_TABLET | Freq: Every day | ORAL | Status: DC

## 2011-10-14 NOTE — Assessment & Plan Note (Signed)
Doubt he has had metformin recently. WRote rx for him

## 2011-10-14 NOTE — Assessment & Plan Note (Signed)
Note this patient does have multiple risk factors for coronary disease. He has a father had a heart attack in his 30s. The patient himself does have diabetes mellitus he does have hyperlipidemia he has hypertension and he is HIV itself. I did check stat cardiac enzymes today and EKG all of which were normal. Echocardiogram previously did show some left ventricular hypertrophy. I think he should be risk stratified out like him to see a cardiologist as was the plan several years ago. Will refer him to cardiology and hopefully he can make this appointment before he is in need of leaving town [place] 2 [place]. I have stressed to him the most importance of diagnosing and treating coronary disease early rather than waiting for him to have complications related to that. Certainly this made also be related to his lipid hypertrophy. I will put him on a low dose atenolol which may help with the palpitationsand be easy for him to take.

## 2011-10-14 NOTE — Assessment & Plan Note (Signed)
Likely residual post viral cough. Will check labs and CXR

## 2011-10-14 NOTE — Progress Notes (Signed)
Subjective:    Patient ID: Ricardo [name], male    DOB: 02-Mar-1961, 50 y.o.   MRN: [id number]  HPI  50 yo man with HIV well controlled , who developed illness in [place] city en route to [place]. Pt had temperature of 102, cough nonproductive, congestion. DId not get out of bed for 2 days. He had called in clinic and we calledin zpack. Started feeling better. Still feels weak, had difficulty putting belt on tractor. Still coughing from time to time,. Still congested. No fevers anymore. He has pain with picking up objects in lower back and back of thighs. He notices that when he exerts himself he has feel  Thumping in his chest. No pain,not pressure.  No radiation. He will feel out of breath. No nausea associated. This will last few a minutes at most. Relieved with rest. He had similar symptoms approximately 3 years ago when I saw him in clinic. We obtained an Echo at that time which showed LVH and EF 65%. We checked EKG today and it did not show any ST or T wave changes and just NSR.  We spent greater than 45 minutes with the pt including greater than 50% of time in face to face counseling of the pt and in coordination of his care.  Review of Systems  Constitutional: Negative for fever, chills, diaphoresis, activity change, appetite change, fatigue and unexpected weight change.  HENT: Negative for congestion, sore throat, rhinorrhea, sneezing, trouble swallowing and sinus pressure.   Eyes: Negative for photophobia and visual disturbance.  Respiratory: Positive for cough, chest tightness and shortness of breath. Negative for wheezing and stridor.   Cardiovascular: Positive for palpitations. Negative for leg swelling.  Gastrointestinal: Negative for nausea, vomiting, abdominal pain, diarrhea, constipation, blood in stool, abdominal distention and anal bleeding.  Genitourinary: Negative for dysuria, hematuria, flank pain and difficulty urinating.  Musculoskeletal: Negative for myalgias, back pain, joint  swelling, arthralgias and gait problem.  Skin: Negative for color change, pallor, rash and wound.  Neurological: Negative for dizziness, tremors, weakness and light-headedness.  Hematological: Negative for adenopathy. Does not bruise/bleed easily.  Psychiatric/Behavioral: Negative for behavioral problems, confusion, sleep disturbance, dysphoric mood, decreased concentration and agitation.       Objective:   Physical Exam  Constitutional: He is oriented to person, place, and time. He appears well-developed and well-nourished. No distress.  HENT:  Head: Normocephalic and atraumatic.  Mouth/Throat: Oropharynx is clear and moist. No oropharyngeal exudate.  Eyes: Conjunctivae and EOM are normal. Pupils are equal, round, and reactive to light. No scleral icterus.  Neck: Normal range of motion. Neck supple. No JVD present.  Cardiovascular: Normal rate, regular rhythm and normal heart sounds.  Exam reveals no gallop and no friction rub.   No murmur heard. Pulmonary/Chest: Effort normal and breath sounds normal. No respiratory distress. He has no wheezes. He has no rales. He exhibits no tenderness.  Abdominal: He exhibits no distension and no mass. There is no tenderness. There is no rebound and no guarding.  Musculoskeletal: He exhibits no edema and no tenderness.  Lymphadenopathy:    He has no cervical adenopathy.  Neurological: He is alert and oriented to person, place, and time. He has normal reflexes. He exhibits normal muscle tone. Coordination normal.  Skin: Skin is warm and dry. He is not diaphoretic. No erythema. No pallor.  Psychiatric: He has a normal mood and affect. His behavior is normal. Judgment and thought content normal.  Assessment & Plan:  HIV DISEASE Excellent control! Given his diabetes, hyperlipidemia, may consider changing him to a non boosted PI regimen. Will need to review past regimens and genotypes. He is HIGHLY adherent. WOuld prefer though that he be on  Qdaily drug  DM, UNCOMPLICATED, TYPE II Doubt he has had metformin recently. WRote rx for him  PALPITATIONS Note this patient does have multiple risk factors for coronary disease. He has a father had a heart attack in his 30s. The patient himself does have diabetes mellitus he does have hyperlipidemia he has hypertension and he is HIV itself. I did check stat cardiac enzymes today and EKG all of which were normal. Echocardiogram previously did show some left ventricular hypertrophy. I think he should be risk stratified out like him to see a cardiologist as was the plan several years ago. Will refer him to cardiology and hopefully he can make this appointment before he is in need of leaving town at 2 drive across the country again. I have stressed to him the most importance of diagnosing and treating coronary disease early rather than waiting for him to have complications related to that. Certainly this made also be related to his lipid hypertrophy. I will put him on a low dose atenolol which may help with the palpitationsand be easy for him to take.  Cough Likely residual post viral cough. Will check labs and CXR

## 2011-10-14 NOTE — Assessment & Plan Note (Signed)
Excellent control! Given his diabetes, hyperlipidemia, may consider changing him to a non boosted PI regimen. Will need to review past regimens and genotypes. He is HIGHLY adherent. WOuld prefer though that he be on Qdaily drug

## 2011-10-14 NOTE — Patient Instructions (Signed)
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

## 2011-10-16 ENCOUNTER — Other Ambulatory Visit: Payer: Self-pay | Admitting: *Deleted

## 2011-10-16 DIAGNOSIS — B20 Human immunodeficiency virus [HIV] disease: Secondary | ICD-10-CM

## 2011-10-16 MED ORDER — ATAZANAVIR SULFATE 300 MG PO CAPS: 300.0000 mg | ORAL_CAPSULE | Freq: Every day | ORAL | Status: DC

## 2011-10-16 MED ORDER — EMTRICITABINE-TENOFOVIR DF 200-300 MG PO TABS: 1.0000 | ORAL_TABLET | Freq: Every day | ORAL | Status: DC

## 2011-10-16 MED ORDER — RITONAVIR 100 MG PO TABS: 100.0000 mg | ORAL_TABLET | Freq: Every day | ORAL | Status: DC

## 2011-11-16 ENCOUNTER — Other Ambulatory Visit: Payer: Self-pay | Admitting: *Deleted

## 2011-11-16 MED ORDER — METFORMIN HCL 1000 MG PO TABS: 1000.0000 mg | ORAL_TABLET | Freq: Two times a day (BID) | ORAL | Status: DC

## 2012-01-19 ENCOUNTER — Other Ambulatory Visit: Payer: Self-pay | Admitting: Infectious Disease

## 2012-01-19 DIAGNOSIS — Z113 Encounter for screening for infections with a predominantly sexual mode of transmission: Secondary | ICD-10-CM

## 2012-01-19 DIAGNOSIS — B20 Human immunodeficiency virus [HIV] disease: Secondary | ICD-10-CM

## 2012-01-19 DIAGNOSIS — Z79899 Other long term (current) drug therapy: Secondary | ICD-10-CM

## 2012-02-03 ENCOUNTER — Other Ambulatory Visit: Payer: Self-pay

## 2012-02-17 ENCOUNTER — Ambulatory Visit: Payer: Self-pay | Admitting: Infectious Disease

## 2012-02-17 ENCOUNTER — Ambulatory Visit: Payer: Self-pay

## 2012-02-17 ENCOUNTER — Other Ambulatory Visit (INDEPENDENT_AMBULATORY_CARE_PROVIDER_SITE_OTHER): Payer: Self-pay

## 2012-02-17 ENCOUNTER — Telehealth: Payer: Self-pay | Admitting: *Deleted

## 2012-02-17 DIAGNOSIS — B20 Human immunodeficiency virus [HIV] disease: Secondary | ICD-10-CM

## 2012-02-17 DIAGNOSIS — Z113 Encounter for screening for infections with a predominantly sexual mode of transmission: Secondary | ICD-10-CM

## 2012-02-17 DIAGNOSIS — Z79899 Other long term (current) drug therapy: Secondary | ICD-10-CM

## 2012-02-17 DIAGNOSIS — G4700 Insomnia, unspecified: Secondary | ICD-10-CM

## 2012-02-17 LAB — COMPREHENSIVE METABOLIC PANEL
AST: 48 U/L — ABNORMAL HIGH (ref 0–37)
Albumin: 4.6 g/dL (ref 3.5–5.2)
Alkaline Phosphatase: 84 U/L (ref 39–117)
BUN: 13 mg/dL (ref 6–23)
Potassium: 4.3 mEq/L (ref 3.5–5.3)
Sodium: 138 mEq/L (ref 135–145)

## 2012-02-17 LAB — CBC WITH DIFFERENTIAL/PLATELET
Basophils Absolute: 0.1 10*3/uL (ref 0.0–0.1)
Basophils Relative: 1 % (ref 0–1)
MCHC: 34.3 g/dL (ref 30.0–36.0)
Monocytes Absolute: 0.4 10*3/uL (ref 0.1–1.0)
Neutro Abs: 1.9 10*3/uL (ref 1.7–7.7)
Neutrophils Relative %: 36 % — ABNORMAL LOW (ref 43–77)
Platelets: 249 10*3/uL (ref 150–400)
RDW: 12.9 % (ref 11.5–15.5)

## 2012-02-17 LAB — LIPID PANEL
Cholesterol: 192 mg/dL (ref 0–200)
Total CHOL/HDL Ratio: 6.4 Ratio

## 2012-02-17 MED ORDER — ZOLPIDEM TARTRATE 10 MG PO TABS: 10.0000 mg | ORAL_TABLET | Freq: Every evening | ORAL | Status: DC | PRN

## 2012-02-17 NOTE — Telephone Encounter (Signed)
Patient called advised he has not had this in 1 month and needs it refilled. He has an appointment on 03/02/12 with provider advised him will refill with no additional refills and he will need to speak with the provider at his visit to receive further refills.

## 2012-02-18 LAB — T-HELPER CELL (CD4) - (RCID CLINIC ONLY): CD4 % Helper T Cell: 25 % — ABNORMAL LOW (ref 33–55)

## 2012-02-19 LAB — HIV-1 RNA QUANT-NO REFLEX-BLD
HIV 1 RNA Quant: <20 copies/mL (ref <20)
HIV-1 RNA Quant, Log: <1.3 {Log} (ref <1.30)

## 2012-03-02 ENCOUNTER — Ambulatory Visit: Payer: Self-pay | Admitting: Infectious Disease

## 2012-03-28 ENCOUNTER — Ambulatory Visit: Payer: Self-pay | Admitting: Internal Medicine

## 2012-05-04 ENCOUNTER — Ambulatory Visit (INDEPENDENT_AMBULATORY_CARE_PROVIDER_SITE_OTHER): Payer: Self-pay | Admitting: Infectious Disease

## 2012-05-04 ENCOUNTER — Encounter: Payer: Self-pay | Admitting: Infectious Disease

## 2012-05-04 ENCOUNTER — Other Ambulatory Visit: Payer: Self-pay | Admitting: *Deleted

## 2012-05-04 ENCOUNTER — Other Ambulatory Visit: Payer: Self-pay | Admitting: Infectious Disease

## 2012-05-04 VITALS — BP 147/75 | HR 90 | Temp 98.2°F | Ht 72.0 in | Wt 246.0 lb

## 2012-05-04 DIAGNOSIS — G629 Polyneuropathy, unspecified: Secondary | ICD-10-CM

## 2012-05-04 DIAGNOSIS — R002 Palpitations: Secondary | ICD-10-CM

## 2012-05-04 DIAGNOSIS — E119 Type 2 diabetes mellitus without complications: Secondary | ICD-10-CM

## 2012-05-04 DIAGNOSIS — G4700 Insomnia, unspecified: Secondary | ICD-10-CM

## 2012-05-04 DIAGNOSIS — G589 Mononeuropathy, unspecified: Secondary | ICD-10-CM

## 2012-05-04 DIAGNOSIS — E781 Pure hyperglyceridemia: Secondary | ICD-10-CM

## 2012-05-04 DIAGNOSIS — B20 Human immunodeficiency virus [HIV] disease: Secondary | ICD-10-CM

## 2012-05-04 DIAGNOSIS — I517 Cardiomegaly: Secondary | ICD-10-CM

## 2012-05-04 MED ORDER — ATENOLOL 50 MG PO TABS: 50.0000 mg | ORAL_TABLET | Freq: Every day | ORAL | Status: DC

## 2012-05-04 MED ORDER — PRAVASTATIN SODIUM 20 MG PO TABS: 20.0000 mg | ORAL_TABLET | Freq: Every day | ORAL | Status: DC

## 2012-05-04 MED ORDER — EMTRICITABINE-TENOFOVIR DF 200-300 MG PO TABS: 1.0000 | ORAL_TABLET | Freq: Every day | ORAL | Status: DC

## 2012-05-04 MED ORDER — ATAZANAVIR SULFATE 300 MG PO CAPS: 300.0000 mg | ORAL_CAPSULE | Freq: Every day | ORAL | Status: DC

## 2012-05-04 MED ORDER — GLIPIZIDE 5 MG PO TABS: 5.0000 mg | ORAL_TABLET | Freq: Every day | ORAL | Status: DC

## 2012-05-04 MED ORDER — ZOLPIDEM TARTRATE 10 MG PO TABS: 10.0000 mg | ORAL_TABLET | Freq: Every evening | ORAL | Status: DC | PRN

## 2012-05-04 MED ORDER — RITONAVIR 100 MG PO TABS: 100.0000 mg | ORAL_TABLET | Freq: Every day | ORAL | Status: DC

## 2012-05-04 MED ORDER — GLIPIZIDE 5 MG PO TABS: 5.0000 mg | ORAL_TABLET | Freq: Two times a day (BID) | ORAL | Status: DC

## 2012-05-04 NOTE — Patient Instructions (Signed)
Please consider change from current regimen to  intelence 200mg  TWO tablets once daily with   truvada once daily  It will likely help you have better cholesterol and reduce risk of heart disease, stroke

## 2012-05-04 NOTE — Progress Notes (Signed)
  Subjective:    Patient ID: Ricardo [name], male    DOB: 1961/01/21, 51 y.o.   MRN: [id number]  HPI  51 yo man with HIV on reyataz, norvir and truvada with perfect virological suppression. He has been off his metformin for his DM due to his having had diarrhea with this even at a reduced dose. He is also off his anti-HTNsive and his [place]. I discussed switching him to intelence and truvada today but he wishes to think about this first.he is having sporadic pain in his legs from feet into ankles that occurs more typically when he exerts himself.  I spent greater than 45 minutes with the patient including greater than 50% of time in face to face counsel of the patient and in coordination of their care.    Review of Systems  Constitutional: Negative for fever, chills, diaphoresis, activity change, appetite change, fatigue and unexpected weight change.  HENT: Negative for congestion, sore throat, rhinorrhea, sneezing, trouble swallowing and sinus pressure.   Eyes: Negative for photophobia and visual disturbance.  Respiratory: Negative for cough, chest tightness, shortness of breath, wheezing and stridor.   Cardiovascular: Negative for chest pain, palpitations and leg swelling.  Gastrointestinal: Negative for nausea, vomiting, abdominal pain, diarrhea, constipation, blood in stool, abdominal distention and anal bleeding.  Genitourinary: Negative for dysuria, hematuria, flank pain and difficulty urinating.  Musculoskeletal: Positive for arthralgias. Negative for myalgias, back pain, joint swelling and gait problem.  Skin: Negative for color change, pallor, rash and wound.  Neurological: Negative for dizziness, tremors, weakness and light-headedness.  Hematological: Negative for adenopathy. Does not bruise/bleed easily.  Psychiatric/Behavioral: Negative for behavioral problems, confusion, sleep disturbance, dysphoric mood, decreased concentration and agitation.       Objective:   Physical  Exam  Constitutional: He is oriented to person, place, and time. He appears well-developed and well-nourished. No distress.  HENT:  Head: Normocephalic and atraumatic.  Mouth/Throat: Oropharynx is clear and moist. No oropharyngeal exudate.  Eyes: Conjunctivae and EOM are normal. Pupils are equal, round, and reactive to light. No scleral icterus.  Neck: Normal range of motion. Neck supple. No JVD present.  Cardiovascular: Normal rate, regular rhythm and normal heart sounds.  Exam reveals no gallop and no friction rub.   No murmur heard. Pulmonary/Chest: Effort normal and breath sounds normal. No respiratory distress. He has no wheezes. He has no rales. He exhibits no tenderness.  Abdominal: He exhibits no distension and no mass. There is no tenderness. There is no rebound and no guarding.  Musculoskeletal: He exhibits no edema and no tenderness.  Lymphadenopathy:    He has no cervical adenopathy.  Neurological: He is alert and oriented to person, place, and time. He has normal reflexes. He exhibits normal muscle tone. Coordination normal.  Skin: Skin is warm and dry. He is not diaphoretic. No erythema. No pallor.  Psychiatric: He has a normal mood and affect. His behavior is normal. Judgment and thought content normal.          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

## 2012-05-04 NOTE — Assessment & Plan Note (Signed)
Restart his atenolol

## 2012-05-04 NOTE — Assessment & Plan Note (Signed)
i would like to change to 400mg  of intelence with truvada daily

## 2012-05-04 NOTE — Assessment & Plan Note (Signed)
ambien

## 2012-05-04 NOTE — Assessment & Plan Note (Addendum)
Restart the pravachol would like to get him off of his PI regimen

## 2012-05-04 NOTE — Assessment & Plan Note (Signed)
Change to low dose glipizide

## 2012-05-04 NOTE — Assessment & Plan Note (Signed)
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

## 2012-05-05 ENCOUNTER — Ambulatory Visit: Payer: Self-pay | Admitting: Infectious Disease

## 2012-05-05 ENCOUNTER — Other Ambulatory Visit: Payer: Self-pay | Admitting: *Deleted

## 2012-05-05 ENCOUNTER — Ambulatory Visit: Payer: Self-pay | Admitting: Internal Medicine

## 2012-05-05 DIAGNOSIS — G4700 Insomnia, unspecified: Secondary | ICD-10-CM

## 2012-05-05 DIAGNOSIS — I517 Cardiomegaly: Secondary | ICD-10-CM

## 2012-05-05 DIAGNOSIS — E781 Pure hyperglyceridemia: Secondary | ICD-10-CM

## 2012-05-05 DIAGNOSIS — R002 Palpitations: Secondary | ICD-10-CM

## 2012-05-05 DIAGNOSIS — B20 Human immunodeficiency virus [HIV] disease: Secondary | ICD-10-CM

## 2012-05-05 DIAGNOSIS — G629 Polyneuropathy, unspecified: Secondary | ICD-10-CM

## 2012-05-05 DIAGNOSIS — E119 Type 2 diabetes mellitus without complications: Secondary | ICD-10-CM

## 2012-05-05 LAB — CBC WITH DIFFERENTIAL/PLATELET
Basophils Relative: 1 % (ref 0–1)
Eosinophils Absolute: 0.2 10*3/uL (ref 0.0–0.7)
Eosinophils Relative: 4 % (ref 0–5)
Hemoglobin: 15.2 g/dL (ref 13.0–17.0)
Lymphs Abs: 2.6 10*3/uL (ref 0.7–4.0)
MCH: 31.5 pg (ref 26.0–34.0)
MCHC: 33 g/dL (ref 30.0–36.0)
MCV: 95.2 fL (ref 78.0–100.0)
Monocytes Relative: 10 % (ref 3–12)
Platelets: 254 10*3/uL (ref 150–400)
RBC: 4.83 MIL/uL (ref 4.22–5.81)

## 2012-05-05 LAB — LIPID PANEL
Cholesterol: 202 mg/dL — ABNORMAL HIGH (ref 0–200)
HDL: 28 mg/dL — ABNORMAL LOW (ref >39)
Triglycerides: 391 mg/dL — ABNORMAL HIGH (ref <150)

## 2012-05-05 LAB — COMPLETE METABOLIC PANEL WITH GFR
CO2: 25 mEq/L (ref 19–32)
Creat: 0.93 mg/dL (ref 0.50–1.35)
GFR, Est African American: >89 mL/min
GFR, Est Non African American: >89 mL/min
Glucose, Bld: 111 mg/dL — ABNORMAL HIGH (ref 70–99)
Total Bilirubin: 0.9 mg/dL (ref 0.3–1.2)

## 2012-05-05 MED ORDER — ZOLPIDEM TARTRATE 10 MG PO TABS: 10.0000 mg | ORAL_TABLET | Freq: Every evening | ORAL | Status: DC | PRN

## 2012-05-06 LAB — T-HELPER CELL (CD4) - (RCID CLINIC ONLY): CD4 T Cell Abs: 600 uL (ref 400–2700)

## 2012-05-10 ENCOUNTER — Other Ambulatory Visit: Payer: Self-pay | Admitting: *Deleted

## 2012-05-10 DIAGNOSIS — B20 Human immunodeficiency virus [HIV] disease: Secondary | ICD-10-CM

## 2012-05-10 MED ORDER — RITONAVIR 100 MG PO TABS: 100.0000 mg | ORAL_TABLET | Freq: Every day | ORAL | Status: DC

## 2012-05-10 MED ORDER — ATAZANAVIR SULFATE 300 MG PO CAPS: 300.0000 mg | ORAL_CAPSULE | Freq: Every day | ORAL | Status: DC

## 2012-05-10 MED ORDER — EMTRICITABINE-TENOFOVIR DF 200-300 MG PO TABS: 1.0000 | ORAL_TABLET | Freq: Every day | ORAL | Status: DC

## 2012-05-10 NOTE — Telephone Encounter (Signed)
States the HIV drugs were sent to his local [facility] store in [place]. He is upset. They come from mail order from [facility] in [place]. He is concerned that someone at his local store will now know he has the HIV  Virus. He asked that I call them & tell them they were sent in error. I told him I will do this now. He was ok with this solution. I spoke with Clydie Braun at the [facility]. I apologized for this & assured him we will send the HIV drugs to [facility] only. I asked if he wanted the different pharmacies deleted. He said no as he is in [place] & other states at times

## 2012-05-11 ENCOUNTER — Ambulatory Visit: Payer: Self-pay | Admitting: Infectious Disease

## 2012-07-18 ENCOUNTER — Ambulatory Visit: Payer: Self-pay

## 2012-08-03 ENCOUNTER — Other Ambulatory Visit: Payer: Self-pay | Admitting: *Deleted

## 2012-08-03 DIAGNOSIS — E119 Type 2 diabetes mellitus without complications: Secondary | ICD-10-CM

## 2012-08-03 DIAGNOSIS — G629 Polyneuropathy, unspecified: Secondary | ICD-10-CM

## 2012-08-03 DIAGNOSIS — E781 Pure hyperglyceridemia: Secondary | ICD-10-CM

## 2012-08-03 DIAGNOSIS — R002 Palpitations: Secondary | ICD-10-CM

## 2012-08-03 DIAGNOSIS — I517 Cardiomegaly: Secondary | ICD-10-CM

## 2012-08-03 DIAGNOSIS — G4700 Insomnia, unspecified: Secondary | ICD-10-CM

## 2012-08-03 DIAGNOSIS — B20 Human immunodeficiency virus [HIV] disease: Secondary | ICD-10-CM

## 2012-08-03 MED ORDER — GLIPIZIDE 5 MG PO TABS: 5.0000 mg | ORAL_TABLET | Freq: Every day | ORAL | Status: DC

## 2012-08-03 MED ORDER — ATENOLOL 50 MG PO TABS: 50.0000 mg | ORAL_TABLET | Freq: Every day | ORAL | Status: DC

## 2012-08-03 MED ORDER — PRAVASTATIN SODIUM 20 MG PO TABS: 20.0000 mg | ORAL_TABLET | Freq: Every day | ORAL | Status: DC

## 2012-08-04 ENCOUNTER — Other Ambulatory Visit: Payer: Self-pay | Admitting: *Deleted

## 2012-08-04 DIAGNOSIS — E781 Pure hyperglyceridemia: Secondary | ICD-10-CM

## 2012-08-04 DIAGNOSIS — E119 Type 2 diabetes mellitus without complications: Secondary | ICD-10-CM

## 2012-08-04 DIAGNOSIS — R002 Palpitations: Secondary | ICD-10-CM

## 2012-08-04 DIAGNOSIS — I517 Cardiomegaly: Secondary | ICD-10-CM

## 2012-08-04 MED ORDER — PRAVASTATIN SODIUM 20 MG PO TABS: 20.0000 mg | ORAL_TABLET | Freq: Every day | ORAL | Status: DC

## 2012-08-04 MED ORDER — GLIPIZIDE 5 MG PO TABS: 5.0000 mg | ORAL_TABLET | Freq: Every day | ORAL | Status: DC

## 2012-08-04 MED ORDER — ATENOLOL 50 MG PO TABS: 50.0000 mg | ORAL_TABLET | Freq: Every day | ORAL | Status: DC

## 2012-08-08 NOTE — Telephone Encounter (Signed)
Done

## 2012-10-31 ENCOUNTER — Other Ambulatory Visit (INDEPENDENT_AMBULATORY_CARE_PROVIDER_SITE_OTHER): Payer: Self-pay

## 2012-10-31 DIAGNOSIS — E781 Pure hyperglyceridemia: Secondary | ICD-10-CM

## 2012-10-31 DIAGNOSIS — B20 Human immunodeficiency virus [HIV] disease: Secondary | ICD-10-CM

## 2012-10-31 DIAGNOSIS — I517 Cardiomegaly: Secondary | ICD-10-CM

## 2012-10-31 DIAGNOSIS — E119 Type 2 diabetes mellitus without complications: Secondary | ICD-10-CM

## 2012-10-31 DIAGNOSIS — G629 Polyneuropathy, unspecified: Secondary | ICD-10-CM

## 2012-10-31 DIAGNOSIS — R002 Palpitations: Secondary | ICD-10-CM

## 2012-10-31 DIAGNOSIS — G4700 Insomnia, unspecified: Secondary | ICD-10-CM

## 2012-10-31 LAB — COMPLETE METABOLIC PANEL WITH GFR
ALT: 75 U/L — ABNORMAL HIGH (ref 0–53)
AST: 45 U/L — ABNORMAL HIGH (ref 0–37)
Albumin: 4.5 g/dL (ref 3.5–5.2)
BUN: 13 mg/dL (ref 6–23)
CO2: 28 mEq/L (ref 19–32)
Calcium: 10.2 mg/dL (ref 8.4–10.5)
Chloride: 104 mEq/L (ref 96–112)
Creat: 0.95 mg/dL (ref 0.50–1.35)
GFR, Est African American: >89 mL/min
Potassium: 4.1 mEq/L (ref 3.5–5.3)

## 2012-10-31 LAB — CBC WITH DIFFERENTIAL/PLATELET
Eosinophils Relative: 3 % (ref 0–5)
HCT: 43.6 % (ref 39.0–52.0)
Hemoglobin: 15.3 g/dL (ref 13.0–17.0)
Lymphocytes Relative: 41 % (ref 12–46)
Lymphs Abs: 2 10*3/uL (ref 0.7–4.0)
MCV: 91 fL (ref 78.0–100.0)
Monocytes Absolute: 0.4 10*3/uL (ref 0.1–1.0)
Neutro Abs: 2.3 10*3/uL (ref 1.7–7.7)
RBC: 4.79 MIL/uL (ref 4.22–5.81)
WBC: 4.9 10*3/uL (ref 4.0–10.5)

## 2012-10-31 LAB — LDL CHOLESTEROL, DIRECT: Direct LDL: 97 mg/dL

## 2012-11-01 LAB — HIV-1 RNA QUANT-NO REFLEX-BLD: HIV-1 RNA Quant, Log: 1.53 {Log} — ABNORMAL HIGH (ref <1.30)

## 2012-11-01 LAB — T-HELPER CELL (CD4) - (RCID CLINIC ONLY): CD4 T Cell Abs: 460 uL (ref 400–2700)

## 2012-11-01 LAB — RPR

## 2012-11-14 ENCOUNTER — Ambulatory Visit: Payer: Self-pay | Admitting: Infectious Disease

## 2012-11-14 ENCOUNTER — Telehealth: Payer: Self-pay | Admitting: *Deleted

## 2012-11-14 NOTE — Telephone Encounter (Signed)
Per patient, he tried to call this morning and let us know he wouldn't be able to make the appointment, but called before the office was open. He would like to know if Dr. Algis Liming can call him with results and discuss any changes over the phone, as he is currently on a long-distance drive for work. He may be unavailable at times today [place]  He is hesitant to reschedule this appointment because of his changing work schedule. The easiest number to reach him is [phone]   Andree Coss

## 2012-11-16 ENCOUNTER — Telehealth: Payer: Self-pay | Admitting: Infectious Disease

## 2012-11-16 MED ORDER — DOLUTEGRAVIR SODIUM 50 MG PO TABS: 50.0000 mg | ORAL_TABLET | Freq: Every day | ORAL | Status: DC

## 2012-11-16 NOTE — Telephone Encounter (Signed)
I CALLED PT AND CHANGED HIS REGIMEN HE NEEDS TO COME BACK IN NEXT 4 MONTHS FOR REPEAT LABS AND VISIT WITH ME

## 2012-11-16 NOTE — Telephone Encounter (Signed)
I SPOKE TO Ricardo [name] AND REVIEWED ALL OF HIS LABS  I AM GOING TO CHANGE HIS ARV REGIMEN TO  TIVICAY AND TRUVADA  I AM SENDING THEM INTO [facility]   HE IS NOT ON METFORMIN SO NO ISSUE ABOUT DRUG INTERACTIONS. HE NEEDS RECHECK OF LABS IN NEXT 4 MONTHS IF POSSIBLE

## 2012-11-29 ENCOUNTER — Telehealth: Payer: Self-pay | Admitting: *Deleted

## 2012-11-29 NOTE — Telephone Encounter (Signed)
Erin from [facility] called to verify the patient new HIV meds. She advised she called the patient and he was not able to tell them what he was changed to just that he was changed. Advised her per Dr Daiva Eves last phone note the patient was changed to Tivicay and Truvada only. They will ship his new order on 12/02/12.

## 2013-02-08 ENCOUNTER — Ambulatory Visit: Payer: Self-pay

## 2013-02-13 ENCOUNTER — Ambulatory Visit: Payer: Self-pay

## 2013-02-17 ENCOUNTER — Telehealth: Payer: Self-pay | Admitting: *Deleted

## 2013-02-17 NOTE — Telephone Encounter (Signed)
He can have the [place] that is fine thx Feliz Beam. 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

## 2013-02-17 NOTE — Telephone Encounter (Signed)
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 I will let him know and call it in.

## 2013-02-17 NOTE — Telephone Encounter (Signed)
Patient called and advised that he is back in town and needs a refill on his Ambien. He advised that he used to get it and had to stop because he was out of the state more than in town over the past 9 months. I advised the patient that he has not had an office visit since May 2013 and needs to schedule office visit and labs. Patient advised he can not schedule at this time but will call back when he knows his upcomming schedule. Advised the patient since we have not filled the Rx for Ambien since May I will have to get permission from the doctor and call him back. He advised the [phone] number is the best one to reach him.

## 2013-02-21 ENCOUNTER — Other Ambulatory Visit: Payer: Self-pay | Admitting: *Deleted

## 2013-02-21 DIAGNOSIS — E781 Pure hyperglyceridemia: Secondary | ICD-10-CM

## 2013-02-21 DIAGNOSIS — G4700 Insomnia, unspecified: Secondary | ICD-10-CM

## 2013-02-21 DIAGNOSIS — R002 Palpitations: Secondary | ICD-10-CM

## 2013-02-21 DIAGNOSIS — G629 Polyneuropathy, unspecified: Secondary | ICD-10-CM

## 2013-02-21 DIAGNOSIS — E119 Type 2 diabetes mellitus without complications: Secondary | ICD-10-CM

## 2013-02-21 DIAGNOSIS — B20 Human immunodeficiency virus [HIV] disease: Secondary | ICD-10-CM

## 2013-02-21 DIAGNOSIS — I517 Cardiomegaly: Secondary | ICD-10-CM

## 2013-02-21 MED ORDER — ZOLPIDEM TARTRATE 10 MG PO TABS: 10.0000 mg | ORAL_TABLET | Freq: Every evening | ORAL | Status: DC | PRN

## 2013-04-26 ENCOUNTER — Other Ambulatory Visit (INDEPENDENT_AMBULATORY_CARE_PROVIDER_SITE_OTHER): Payer: Self-pay

## 2013-04-26 ENCOUNTER — Other Ambulatory Visit: Payer: Self-pay | Admitting: Infectious Disease

## 2013-04-26 DIAGNOSIS — Z113 Encounter for screening for infections with a predominantly sexual mode of transmission: Secondary | ICD-10-CM

## 2013-04-26 DIAGNOSIS — B20 Human immunodeficiency virus [HIV] disease: Secondary | ICD-10-CM

## 2013-04-26 DIAGNOSIS — Z79899 Other long term (current) drug therapy: Secondary | ICD-10-CM

## 2013-04-26 LAB — LIPID PANEL
Cholesterol: 182 mg/dL (ref 0–200)
HDL: 24 mg/dL — ABNORMAL LOW (ref >39)
Total CHOL/HDL Ratio: 7.6 Ratio
Triglycerides: 463 mg/dL — ABNORMAL HIGH (ref <150)

## 2013-04-26 LAB — COMPREHENSIVE METABOLIC PANEL
AST: 60 U/L — ABNORMAL HIGH (ref 0–37)
Alkaline Phosphatase: 86 U/L (ref 39–117)
Calcium: 9.5 mg/dL (ref 8.4–10.5)
Potassium: 4.6 mEq/L (ref 3.5–5.3)
Sodium: 136 mEq/L (ref 135–145)

## 2013-04-26 LAB — CBC WITH DIFFERENTIAL/PLATELET
Basophils Absolute: 0.1 10*3/uL (ref 0.0–0.1)
Basophils Relative: 1 % (ref 0–1)
Hemoglobin: 15.9 g/dL (ref 13.0–17.0)
Lymphocytes Relative: 48 % — ABNORMAL HIGH (ref 12–46)
MCHC: 34.9 g/dL (ref 30.0–36.0)
Monocytes Relative: 7 % (ref 3–12)
Neutro Abs: 3 10*3/uL (ref 1.7–7.7)
Neutrophils Relative %: 41 % — ABNORMAL LOW (ref 43–77)
RDW: 13.7 % (ref 11.5–15.5)
WBC: 7.2 10*3/uL (ref 4.0–10.5)

## 2013-04-28 LAB — HIV-1 RNA QUANT-NO REFLEX-BLD
HIV 1 RNA Quant: <20 copies/mL (ref <20)
HIV-1 RNA Quant, Log: <1.3 {Log} (ref <1.30)

## 2013-05-10 ENCOUNTER — Ambulatory Visit (INDEPENDENT_AMBULATORY_CARE_PROVIDER_SITE_OTHER): Payer: Self-pay | Admitting: Infectious Disease

## 2013-05-10 ENCOUNTER — Encounter: Payer: Self-pay | Admitting: Infectious Disease

## 2013-05-10 VITALS — BP 113/65 | HR 69 | Temp 98.2°F | Wt 237.0 lb

## 2013-05-10 DIAGNOSIS — G4700 Insomnia, unspecified: Secondary | ICD-10-CM

## 2013-05-10 DIAGNOSIS — E781 Pure hyperglyceridemia: Secondary | ICD-10-CM

## 2013-05-10 DIAGNOSIS — N419 Inflammatory disease of prostate, unspecified: Secondary | ICD-10-CM | POA: Insufficient documentation

## 2013-05-10 DIAGNOSIS — I517 Cardiomegaly: Secondary | ICD-10-CM

## 2013-05-10 DIAGNOSIS — G629 Polyneuropathy, unspecified: Secondary | ICD-10-CM

## 2013-05-10 DIAGNOSIS — E119 Type 2 diabetes mellitus without complications: Secondary | ICD-10-CM

## 2013-05-10 DIAGNOSIS — G589 Mononeuropathy, unspecified: Secondary | ICD-10-CM

## 2013-05-10 DIAGNOSIS — R002 Palpitations: Secondary | ICD-10-CM

## 2013-05-10 DIAGNOSIS — B20 Human immunodeficiency virus [HIV] disease: Secondary | ICD-10-CM

## 2013-05-10 LAB — BASIC METABOLIC PANEL WITH GFR
BUN: 13 mg/dL (ref 6–23)
Chloride: 106 mEq/L (ref 96–112)
GFR, Est African American: 88 mL/min
GFR, Est Non African American: 76 mL/min
Potassium: 3.8 mEq/L (ref 3.5–5.3)
Sodium: 139 mEq/L (ref 135–145)

## 2013-05-10 LAB — HEMOGLOBIN A1C: Mean Plasma Glucose: 134 mg/dL — ABNORMAL HIGH (ref <117)

## 2013-05-10 MED ORDER — ZOLPIDEM TARTRATE 10 MG PO TABS: 10.0000 mg | ORAL_TABLET | Freq: Every evening | ORAL | Status: DC | PRN

## 2013-05-10 MED ORDER — TRAMADOL HCL 50 MG PO TABS: 50.0000 mg | ORAL_TABLET | Freq: Every evening | ORAL | Status: DC | PRN

## 2013-05-10 MED ORDER — SULFAMETHOXAZOLE-TMP DS 800-160 MG PO TABS: 1.0000 | ORAL_TABLET | Freq: Two times a day (BID) | ORAL | Status: DC

## 2013-05-10 NOTE — Progress Notes (Signed)
Subjective:    Patient ID: Ricardo [name], male    DOB: 1961-10-20, 52 y.o.   MRN: [id number]  HPI  52 yo man with HIV who has been on  reyataz, norvir and truvada with perfect virological suppression and whom we changed to Tivicay and Truvada returns for  routine clinic visit.  On recheck of his viral load is undetectable CD4 count is quite high above 700. Review of his labs show that his serum creatinine has increased slightly from 0.95-1.2. He was recently diagnosed and treated with biotics for prostatitis and benign prostatic hypertrophy. He is on for antagonist for prostate enlargement and finished course of antibiotics although he still not feeling totally recovered.  He also is complaining of some foot pain and knee pain which he believes might be a due to arthritic complaints. He takes some tramadol that his wife had asked if I could prescribe this for him. I told I would do so for short period of time but could not prescribe this for him long-term without further investigating organic causes of this pain.   He has not been taking metformin but also now not taking glipizide either as he felt that he might not need a more now that he has lost further weight.      Review of Systems  Constitutional: Negative for fever, chills, diaphoresis, activity change, appetite change, fatigue and unexpected weight change.  HENT: Negative for congestion, sore throat, rhinorrhea, sneezing, trouble swallowing and sinus pressure.   Eyes: Negative for photophobia and visual disturbance.  Respiratory: Negative for cough, chest tightness, shortness of breath, wheezing and stridor.   Cardiovascular: Negative for chest pain, palpitations and leg swelling.  Gastrointestinal: Negative for nausea, vomiting, abdominal pain, diarrhea, constipation, blood in stool, abdominal distention and anal bleeding.  Genitourinary: Negative for dysuria, hematuria, flank pain and difficulty urinating.  Musculoskeletal:  Positive for arthralgias. Negative for myalgias, back pain, joint swelling and gait problem.  Skin: Negative for color change, pallor, rash and wound.  Neurological: Negative for dizziness, tremors, weakness and light-headedness.  Hematological: Negative for adenopathy. Does not bruise/bleed easily.  Psychiatric/Behavioral: Negative for behavioral problems, confusion, sleep disturbance, dysphoric mood, decreased concentration and agitation.       Objective:   Physical Exam  Constitutional: He is oriented to person, place, and time. He appears well-developed and well-nourished. No distress.  HENT:  Head: Normocephalic and atraumatic.  Mouth/Throat: Oropharynx is clear and moist. No oropharyngeal exudate.  Eyes: Conjunctivae and EOM are normal. Pupils are equal, round, and reactive to light. No scleral icterus.  Neck: Normal range of motion. Neck supple. No JVD present.  Cardiovascular: Normal rate, regular rhythm and normal heart sounds.  Exam reveals no gallop and no friction rub.   No murmur heard. Pulmonary/Chest: Effort normal and breath sounds normal. No respiratory distress. He has no wheezes. He has no rales. He exhibits no tenderness.  Abdominal: He exhibits no distension and no mass. There is no tenderness. There is no rebound and no guarding.  Musculoskeletal: He exhibits no edema and no tenderness.  Lymphadenopathy:    He has no cervical adenopathy.  Neurological: He is alert and oriented to person, place, and time. He has normal reflexes. He exhibits normal muscle tone. Coordination normal.  Skin: Skin is warm and dry. He is not diaphoretic. No erythema. No pallor.  Psychiatric: He has a normal mood and affect. His behavior is normal. Judgment and thought content normal.  Assessment & Plan:  HIV: Continue to do tivicay and Truvada return to clinic clinic in approximately 6 months  DM: Recheck hemoglobin A1c. He is not on anti-diabetes  medications.  Hyperlipidemia recheck a direct LDL at next visit.  Elevated triglycerides: We'll discuss various medical options with my infectious disease pharmacist. He started on Pravachol could consider a different statin or addition of gemfibrozil.  Prostatitis I will give him a month's supply of Bactrim.  Slight elevation in creatinine: This could be do to a pseudo-elevation do to mechanism of to tivicay. We'll recheck labs today and then will consider rechecking after he is done being treated for his prostatitis.  Benign prostatic hypertrophy continue on his alpha blocking agent.  OA with  pain in knees and foot pain: May need to be evaluated by sports medicine for now I'm okay with giving him an Ultram prescription with 30 pills.  Insomnia: rx ambien again

## 2013-05-12 ENCOUNTER — Telehealth: Payer: Self-pay | Admitting: Infectious Disease

## 2013-05-12 NOTE — Telephone Encounter (Signed)
Mr Ricardo [name] is not too bad but not in normal range. He might benefit from going on very low dose metformin of 500mg  daily or bid.   Note the tivicay will raise the metformin levels as well.  I am also OK if he just wants to try to manage this with weight until next time we see him

## 2013-05-17 ENCOUNTER — Other Ambulatory Visit: Payer: Self-pay | Admitting: Infectious Disease

## 2013-05-29 ENCOUNTER — Other Ambulatory Visit: Payer: Self-pay | Admitting: Licensed Clinical Social Worker

## 2013-05-29 DIAGNOSIS — B20 Human immunodeficiency virus [HIV] disease: Secondary | ICD-10-CM

## 2013-05-29 MED ORDER — EMTRICITABINE-TENOFOVIR DF 200-300 MG PO TABS: 1.0000 | ORAL_TABLET | Freq: Every day | ORAL | Status: DC

## 2013-05-29 NOTE — Telephone Encounter (Signed)
No problem.

## 2013-05-29 NOTE — Telephone Encounter (Signed)
Pt notified, also made his 6 month appointment with you.  Pt also wants you to know you "made a good call about his medicines" and he appreciates it.

## 2013-06-11 ENCOUNTER — Other Ambulatory Visit: Payer: Self-pay | Admitting: Infectious Disease

## 2013-06-16 ENCOUNTER — Other Ambulatory Visit: Payer: Self-pay | Admitting: Infectious Disease

## 2013-07-15 ENCOUNTER — Other Ambulatory Visit: Payer: Self-pay | Admitting: Infectious Disease

## 2013-07-15 DIAGNOSIS — E7800 Pure hypercholesterolemia, unspecified: Secondary | ICD-10-CM

## 2013-07-15 DIAGNOSIS — I517 Cardiomegaly: Secondary | ICD-10-CM

## 2013-07-31 ENCOUNTER — Other Ambulatory Visit: Payer: Self-pay | Admitting: *Deleted

## 2013-07-31 DIAGNOSIS — M79609 Pain in unspecified limb: Secondary | ICD-10-CM

## 2013-07-31 MED ORDER — TRAMADOL HCL 50 MG PO TABS: 50.0000 mg | ORAL_TABLET | Freq: Every evening | ORAL | Status: DC | PRN

## 2013-08-28 ENCOUNTER — Ambulatory Visit: Payer: Self-pay

## 2013-09-28 ENCOUNTER — Telehealth: Payer: Self-pay | Admitting: *Deleted

## 2013-09-28 NOTE — Telephone Encounter (Signed)
Patient's wife called at 4:15 pm requesting an appt in the AM with Dr. Daiva Eves for severe pain from his prostate. Dr. Daiva Eves has no available appts in the AM and advised to go to Urgent Care if he is in severe pain. The other option is to call in the morning so we can check with Dr. Daiva Eves to see if patient can be a work in. Ricardo [name]

## 2013-09-29 ENCOUNTER — Telehealth: Payer: Self-pay | Admitting: *Deleted

## 2013-09-29 NOTE — Telephone Encounter (Signed)
Patient called and advised he is having the same symptoms he had the last time he saw Dr Daiva Eves and wants [place] to call in some Tramadol and Bactrim. When asked about his symptoms he advised pain when urinating, pain in his back and frequency. No fever or blood in the urine but lots of pain. Advised the patient can not call in the medications without a visit to see what is wrong, it may be something different. We do not have any appts today but can see him on Monday with [facility] at 1030 am. He advised that is fine but Dr Daiva Eves is his doctor and I advised the patient any of our doctors can see him for a sick visit. Also advised him if he starts feeling worse or there is blood in his urine he could go to the Urgent care or ED.

## 2013-10-02 ENCOUNTER — Ambulatory Visit (INDEPENDENT_AMBULATORY_CARE_PROVIDER_SITE_OTHER): Payer: No Typology Code available for payment source | Admitting: Infectious Diseases

## 2013-10-02 ENCOUNTER — Ambulatory Visit (HOSPITAL_COMMUNITY)
Admission: RE | Admit: 2013-10-02 | Discharge: 2013-10-02 | Disposition: A | Discharge status: Home / Self Care | Payer: No Typology Code available for payment source | Source: Ambulatory Visit | Attending: Infectious Diseases | Admitting: Infectious Diseases

## 2013-10-02 ENCOUNTER — Other Ambulatory Visit: Payer: Self-pay | Admitting: *Deleted

## 2013-10-02 ENCOUNTER — Other Ambulatory Visit: Payer: Self-pay | Admitting: Infectious Diseases

## 2013-10-02 ENCOUNTER — Telehealth: Payer: Self-pay | Admitting: *Deleted

## 2013-10-02 VITALS — Ht 72.0 in | Wt 242.0 lb

## 2013-10-02 DIAGNOSIS — N4400 Torsion of testis, unspecified: Secondary | ICD-10-CM

## 2013-10-02 DIAGNOSIS — N508 Other specified disorders of male genital organs: Secondary | ICD-10-CM | POA: Insufficient documentation

## 2013-10-02 DIAGNOSIS — N419 Inflammatory disease of prostate, unspecified: Secondary | ICD-10-CM

## 2013-10-02 DIAGNOSIS — N433 Hydrocele, unspecified: Secondary | ICD-10-CM | POA: Insufficient documentation

## 2013-10-02 DIAGNOSIS — B20 Human immunodeficiency virus [HIV] disease: Secondary | ICD-10-CM

## 2013-10-02 DIAGNOSIS — N451 Epididymitis: Secondary | ICD-10-CM

## 2013-10-02 DIAGNOSIS — N453 Epididymo-orchitis: Secondary | ICD-10-CM | POA: Insufficient documentation

## 2013-10-02 DIAGNOSIS — Z23 Encounter for immunization: Secondary | ICD-10-CM

## 2013-10-02 LAB — BASIC METABOLIC PANEL
CO2: 25 mEq/L (ref 19–32)
Calcium: 9.7 mg/dL (ref 8.4–10.5)
Potassium: 4.8 mEq/L (ref 3.5–5.3)
Sodium: 133 mEq/L — ABNORMAL LOW (ref 135–145)

## 2013-10-02 LAB — RPR

## 2013-10-02 NOTE — Progress Notes (Signed)
  Subjective:    Patient ID: Ricardo [name], male    DOB: 06/27/1961, 52 y.o.   MRN: [id number]  HPI 52 yo M with HIV previously suppressed on ATVr/TRV then changed to DTGV/TRV  He has also been seen and treated for enlarged proatate, epdidymitis, and prostatitis. At last visit in May he was given 1 month of Bactrim and tramadol. Has not been treated since.  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  Now has pain that is radiating down his R leg as well as into his back.   HIV 1 RNA Quant (copies/mL)  Date Value  04/26/2013 <20   10/31/2012 34*  05/04/2012 <20      CD4 T Cell Abs (cmm)  Date Value  04/26/2013 720   10/31/2012 460   05/04/2012 600      Review of Systems     Objective:   Physical Exam  Constitutional: He appears well-developed and well-nourished.  HENT:  Mouth/Throat: No oropharyngeal exudate.  Eyes: EOM are normal. Pupils are equal, round, and reactive to light.  Neck: Neck supple.  Cardiovascular: Normal rate, regular rhythm and normal heart sounds.   Pulmonary/Chest: Effort normal and breath sounds normal.  Abdominal: Soft. Bowel sounds are normal. He exhibits no distension. There is no tenderness.  Genitourinary: Right testis shows swelling and tenderness. Left testis shows swelling and tenderness.  Lymphadenopathy:    He has no cervical adenopathy.          Assessment & Plan:

## 2013-10-02 NOTE — Addendum Note (Signed)
Addended by: Starleen Arms D on: 10/02/2013 02:03 PM   Modules accepted: Orders

## 2013-10-02 NOTE — Assessment & Plan Note (Signed)
Will check UA, UCx, gc/chalmydia, RPR. Check u/s. Give him rx for leavquin. Have him seen by urology.

## 2013-10-02 NOTE — Telephone Encounter (Signed)
Patient called and advised he has had his ultra sound and wanted to know his results. Advised him the tech has not entered the notes yet but as soon as they do Dr Ninetta Lights will see the note and someone will give him a call. He advised to call him on his cell phone as he is very anxious and in pain.

## 2013-10-02 NOTE — Assessment & Plan Note (Signed)
He is doing well. Will continue his current ART and f/u with Dr Daiva Eves

## 2013-10-03 ENCOUNTER — Telehealth: Payer: Self-pay | Admitting: *Deleted

## 2013-10-03 LAB — CBC WITH DIFFERENTIAL/PLATELET
Basophils Absolute: 0.1 10*3/uL (ref 0.0–0.1)
Basophils Relative: 1 % (ref 0–1)
Hemoglobin: 16.8 g/dL (ref 13.0–17.0)
Lymphocytes Relative: 49 % — ABNORMAL HIGH (ref 12–46)
MCHC: 36.7 g/dL — ABNORMAL HIGH (ref 30.0–36.0)
Neutro Abs: 2.3 10*3/uL (ref 1.7–7.7)
Neutrophils Relative %: 39 % — ABNORMAL LOW (ref 43–77)
Platelets: 238 10*3/uL (ref 150–400)
RDW: 14.1 % (ref 11.5–15.5)

## 2013-10-03 LAB — URINALYSIS, ROUTINE W REFLEX MICROSCOPIC
Hgb urine dipstick: NEGATIVE
Leukocytes, UA: NEGATIVE
Nitrite: NEGATIVE
Protein, ur: 30 mg/dL — AB
Specific Gravity, Urine: 1.027 (ref 1.005–1.030)
Urobilinogen, UA: 0.2 mg/dL (ref 0.0–1.0)
pH: 5 (ref 5.0–8.0)

## 2013-10-03 LAB — URINALYSIS, MICROSCOPIC ONLY
Bacteria, UA: NONE SEEN
Squamous Epithelial / LPF: NONE SEEN

## 2013-10-03 LAB — URINE CULTURE: Organism ID, Bacteria: NO GROWTH

## 2013-10-03 NOTE — Telephone Encounter (Signed)
Pt has been referred to [facility] for recurrent prostatitis and epididymititis, appointment 10/04/13 11:00 with Dr. Retta Diones.  Records faxed to [phone].  Left patient a message with the information, asked him to call back and confirm that he is aware of the appointment.  Pt was given the phone number, address, and map at his appointment 10/02/13.   Andree Coss, RN

## 2013-10-10 ENCOUNTER — Telehealth: Payer: Self-pay | Admitting: *Deleted

## 2013-10-10 ENCOUNTER — Other Ambulatory Visit: Payer: Self-pay | Admitting: *Deleted

## 2013-10-10 DIAGNOSIS — B20 Human immunodeficiency virus [HIV] disease: Secondary | ICD-10-CM

## 2013-10-10 MED ORDER — EMTRICITABINE-TENOFOVIR DF 200-300 MG PO TABS: 1.0000 | ORAL_TABLET | Freq: Every day | ORAL | Status: DC

## 2013-10-10 MED ORDER — DOLUTEGRAVIR SODIUM 50 MG PO TABS: 50.0000 mg | ORAL_TABLET | Freq: Every day | ORAL | Status: DC

## 2013-10-10 NOTE — Telephone Encounter (Signed)
Per patient, he has insurance from [employer] [place] plan effective 09/20/13, but no card yet.  Group #- [id number]  XB#-[id number]

## 2013-11-06 ENCOUNTER — Other Ambulatory Visit: Payer: Self-pay

## 2013-11-20 ENCOUNTER — Ambulatory Visit: Payer: Self-pay | Admitting: Infectious Disease

## 2013-11-20 ENCOUNTER — Other Ambulatory Visit: Payer: Self-pay | Admitting: *Deleted

## 2013-11-20 DIAGNOSIS — B20 Human immunodeficiency virus [HIV] disease: Secondary | ICD-10-CM

## 2013-11-20 MED ORDER — DOLUTEGRAVIR SODIUM 50 MG PO TABS: 50.0000 mg | ORAL_TABLET | Freq: Every day | ORAL | Status: DC

## 2013-11-21 ENCOUNTER — Telehealth: Payer: Self-pay | Admitting: Licensed Clinical Social Worker

## 2013-11-21 NOTE — Telephone Encounter (Signed)
Patient missed appointment yesterday, I left a message for him to call and reschedule.

## 2013-12-12 ENCOUNTER — Other Ambulatory Visit: Payer: No Typology Code available for payment source

## 2013-12-12 DIAGNOSIS — B20 Human immunodeficiency virus [HIV] disease: Secondary | ICD-10-CM

## 2013-12-12 LAB — CBC WITH DIFFERENTIAL/PLATELET
Basophils Relative: 1 % (ref 0–1)
Eosinophils Absolute: 0.2 10*3/uL (ref 0.0–0.7)
Eosinophils Relative: 2 % (ref 0–5)
HCT: 47 % (ref 39.0–52.0)
Lymphs Abs: 2.7 10*3/uL (ref 0.7–4.0)
MCH: 34.7 pg — ABNORMAL HIGH (ref 26.0–34.0)
MCV: 93.8 fL (ref 78.0–100.0)
Monocytes Absolute: 0.6 10*3/uL (ref 0.1–1.0)
Monocytes Relative: 6 % (ref 3–12)
Neutro Abs: 6 10*3/uL (ref 1.7–7.7)
Platelets: 255 10*3/uL (ref 150–400)
RBC: 5.01 MIL/uL (ref 4.22–5.81)

## 2013-12-12 LAB — COMPLETE METABOLIC PANEL WITH GFR
ALT: 124 U/L — ABNORMAL HIGH (ref 0–53)
AST: 90 U/L — ABNORMAL HIGH (ref 0–37)
Albumin: 4.5 g/dL (ref 3.5–5.2)
Alkaline Phosphatase: 113 U/L (ref 39–117)
BUN: 13 mg/dL (ref 6–23)
Calcium: 9.4 mg/dL (ref 8.4–10.5)
Chloride: 98 mEq/L (ref 96–112)
Creat: 1.32 mg/dL (ref 0.50–1.35)
GFR, Est Non African American: 62 mL/min
Potassium: 4.3 mEq/L (ref 3.5–5.3)
Total Bilirubin: 0.6 mg/dL (ref 0.3–1.2)

## 2013-12-12 LAB — LDL CHOLESTEROL, DIRECT: Direct LDL: 50 mg/dL

## 2013-12-12 LAB — LIPID PANEL: Total CHOL/HDL Ratio: 11.2 Ratio

## 2013-12-13 ENCOUNTER — Other Ambulatory Visit: Payer: Self-pay | Admitting: Licensed Clinical Social Worker

## 2013-12-13 LAB — T-HELPER CELL (CD4) - (RCID CLINIC ONLY): CD4 T Cell Abs: 610 /uL (ref 400–2700)

## 2013-12-15 LAB — HIV-1 RNA QUANT-NO REFLEX-BLD
HIV 1 RNA Quant: <20 copies/mL (ref <20)
HIV-1 RNA Quant, Log: <1.3 {Log} (ref <1.30)

## 2013-12-18 ENCOUNTER — Other Ambulatory Visit: Payer: Self-pay | Admitting: Infectious Disease

## 2013-12-18 ENCOUNTER — Telehealth: Payer: Self-pay | Admitting: *Deleted

## 2013-12-18 NOTE — Telephone Encounter (Signed)
Patient was notified 12/24 and that his triglycerides were above 2,000.  Patient stated that he stopped taking his amitriptyline on 12/24, thinking that was causing the elevated triglycerides.  He checked his blood sugar the same day, found it to be 419.  Patient states it has been high since then, ranging from 419-291.  Patient no longer taking metformin or glipizide.  Patient feels that his sugars are high because of his Tivicay and Truvada, is thinking about stopping it.  Patient encouraged to continue taking his regimen, follow up with a PCP for diabetic care.  Patient declined, stating that he "uses Dr. Daiva Eves for everything."  RN educated the patient about the dangers of elevated blood sugars, gave patient s/s of DKA to be aware of, instructed him to go to the ED if he experiences any of these symptoms.  RN also explained the value of a PCP or endocrinologist, patient will consider this after his appointment with Dr. Daiva Eves. RN gave patient phone numbers to [facility] PCP and Endocrinology for him to inquire about care.  Set up appointment with Dr. Daiva Eves for 12/28/13.  Pt verbalized understanding. Andree Coss, RN

## 2013-12-19 ENCOUNTER — Other Ambulatory Visit: Payer: Self-pay | Admitting: *Deleted

## 2013-12-19 DIAGNOSIS — R002 Palpitations: Secondary | ICD-10-CM

## 2013-12-19 DIAGNOSIS — E781 Pure hyperglyceridemia: Secondary | ICD-10-CM

## 2013-12-19 DIAGNOSIS — G629 Polyneuropathy, unspecified: Secondary | ICD-10-CM

## 2013-12-19 DIAGNOSIS — I517 Cardiomegaly: Secondary | ICD-10-CM

## 2013-12-19 DIAGNOSIS — E119 Type 2 diabetes mellitus without complications: Secondary | ICD-10-CM

## 2013-12-19 DIAGNOSIS — B20 Human immunodeficiency virus [HIV] disease: Secondary | ICD-10-CM

## 2013-12-19 DIAGNOSIS — G4700 Insomnia, unspecified: Secondary | ICD-10-CM

## 2013-12-19 MED ORDER — ZOLPIDEM TARTRATE 10 MG PO TABS: 10.0000 mg | ORAL_TABLET | Freq: Every evening | ORAL | Status: DC | PRN

## 2013-12-25 ENCOUNTER — Telehealth: Payer: Self-pay | Admitting: *Deleted

## 2013-12-25 ENCOUNTER — Other Ambulatory Visit: Payer: Self-pay | Admitting: *Deleted

## 2013-12-25 DIAGNOSIS — E119 Type 2 diabetes mellitus without complications: Secondary | ICD-10-CM

## 2013-12-25 NOTE — Telephone Encounter (Signed)
Thanks Sharyn Lull, that should be a big help and I hope he listens to them

## 2013-12-25 NOTE — Telephone Encounter (Signed)
Patient states he has started Janumet twice daily (got it from his mother) and has been checking his blood sugars many times a day, they are usually in the 200's.  Patient rescheduled his appointment to 1/15 due to work concerns.  Patient would like a referral to [facility] endocrinology, does not want to set up PCP care with anyone other than Dr. Hubert Azure, Lanice Schwab, RN

## 2013-12-25 NOTE — Progress Notes (Signed)
Referred to [facility] Endocrinology per Dr. Tommy Medal at patient's request. Landis Gandy, RN

## 2013-12-25 NOTE — Telephone Encounter (Signed)
I made a referral to [facility]ocr[facility] for diabetic management.

## 2013-12-25 NOTE — Telephone Encounter (Signed)
He told me that he was still taking ARVs.  He only stopped the amitriptyline.

## 2013-12-25 NOTE — Telephone Encounter (Signed)
Very good 

## 2013-12-25 NOTE — Telephone Encounter (Signed)
I hope he is still taking his ARVS he should be given his hx of high compliance with ARVS

## 2013-12-28 ENCOUNTER — Ambulatory Visit: Payer: No Typology Code available for payment source | Admitting: Infectious Disease

## 2014-01-04 ENCOUNTER — Ambulatory Visit (INDEPENDENT_AMBULATORY_CARE_PROVIDER_SITE_OTHER): Payer: No Typology Code available for payment source | Admitting: Infectious Disease

## 2014-01-04 ENCOUNTER — Encounter: Payer: Self-pay | Admitting: Infectious Disease

## 2014-01-04 VITALS — BP 135/87 | HR 78 | Temp 98.3°F | Wt 245.0 lb

## 2014-01-04 DIAGNOSIS — E785 Hyperlipidemia, unspecified: Secondary | ICD-10-CM

## 2014-01-04 DIAGNOSIS — G4700 Insomnia, unspecified: Secondary | ICD-10-CM

## 2014-01-04 DIAGNOSIS — E1310 Other specified diabetes mellitus with ketoacidosis without coma: Secondary | ICD-10-CM

## 2014-01-04 DIAGNOSIS — E119 Type 2 diabetes mellitus without complications: Secondary | ICD-10-CM

## 2014-01-04 DIAGNOSIS — M79609 Pain in unspecified limb: Secondary | ICD-10-CM

## 2014-01-04 DIAGNOSIS — B20 Human immunodeficiency virus [HIV] disease: Secondary | ICD-10-CM

## 2014-01-04 DIAGNOSIS — E1110 Type 2 diabetes mellitus with ketoacidosis without coma: Secondary | ICD-10-CM

## 2014-01-04 DIAGNOSIS — E781 Pure hyperglyceridemia: Secondary | ICD-10-CM

## 2014-01-04 LAB — BASIC METABOLIC PANEL WITH GFR
BUN: 14 mg/dL (ref 6–23)
CO2: 23 meq/L (ref 19–32)
CREATININE: 0.85 mg/dL (ref 0.50–1.35)
Calcium: 9.4 mg/dL (ref 8.4–10.5)
Chloride: 104 mEq/L (ref 96–112)
GFR, Est African American: >89 mL/min
GFR, Est Non African American: >89 mL/min
Glucose, Bld: 211 mg/dL — ABNORMAL HIGH (ref 70–99)
Potassium: 4 mEq/L (ref 3.5–5.3)
Sodium: 136 mEq/L (ref 135–145)

## 2014-01-04 LAB — LIPID PANEL
Cholesterol: 185 mg/dL (ref 0–200)
HDL: 29 mg/dL — AB (ref >39)
TRIGLYCERIDES: 460 mg/dL — AB (ref <150)
Total CHOL/HDL Ratio: 6.4 Ratio

## 2014-01-04 LAB — HEMOGLOBIN A1C
HEMOGLOBIN A1C: 9.2 % — AB (ref <5.7)
Mean Plasma Glucose: 217 mg/dL — ABNORMAL HIGH (ref <117)

## 2014-01-04 MED ORDER — METFORMIN HCL 1000 MG PO TABS: 1000.0000 mg | ORAL_TABLET | Freq: Two times a day (BID) | ORAL | Status: DC

## 2014-01-04 MED ORDER — ETRAVIRINE 200 MG PO TABS: 400.0000 mg | ORAL_TABLET | Freq: Every day | ORAL | Status: DC

## 2014-01-04 MED ORDER — ZOLPIDEM TARTRATE 10 MG PO TABS: 10.0000 mg | ORAL_TABLET | Freq: Every evening | ORAL | Status: DC | PRN

## 2014-01-04 MED ORDER — PRAVASTATIN SODIUM 20 MG PO TABS: 20.0000 mg | ORAL_TABLET | Freq: Every day | ORAL | Status: DC

## 2014-01-04 MED ORDER — METFORMIN HCL 1000 MG PO TABS
1000.0000 mg | ORAL_TABLET | Freq: Two times a day (BID) | ORAL | Status: DC
Start: 2014-01-04 — End: 2014-08-15

## 2014-01-04 MED ORDER — TRAMADOL HCL 50 MG PO TABS: 50.0000 mg | ORAL_TABLET | Freq: Every evening | ORAL | Status: DC | PRN

## 2014-01-04 NOTE — Progress Notes (Signed)
Subjective:    Patient ID: Ricardo [name], male    DOB: 02-Sep-1961, 53 y.o.   MRN: [id number]  HPI   53 yo man with HIV who had been on  reyataz, norvir and truvada with perfect virological suppression and whom we changed to Tivicay and Truvada in December 2013.      On recheck of his viral load is undetectable CD4 count is quite high above 700.   We had also received recent blood work in December which showed history) only be 2538. When he was called about the elevated triglycerides he began to wonder if it was due to his new medications. Note his triglycerides have been 463 in May of 2014 he was RE on TIVICAY and Truvada. He also began to monitor his blood sugar now and notices blood sugars are running anywhere between 180-2 200 or 400.   NOTE HE HAD STOPPED METFORMIN AND GLIPIZIDE DUE TO WEIGHT LOSS AT THAT TIME AND A1C WAS AT 6.  In the interim he began reading about TIVICAY and also the trisected antidepressant that would place him on amitriptyline and found mention of elevated blood sugars with both of these drugs. There is mention of other blood sugars and 5% of individuals taking TIVICAY also mention of this with amitriptyline.  We reviewed various other antiretroviral regimens to change him to any and opted for foreign milligrams of Intelence once daily with Truvada.   I suspect on closer review of his chart that his blood sugars really or higher due to a combination of his weight gain his stopping metformin and his stopping glipizide.  Currently however he had restarted a combination metformin pill that his mother had around and even with this his blood sugars were lower. A triglyceride elevation is rather remarkable NA she wondered he did prior to the labs being drawn. He swears however that he had been fasting.     Review of Systems  Constitutional: Negative for fever, chills, diaphoresis, activity change, appetite change, fatigue and unexpected weight change.  HENT:  Negative for congestion, rhinorrhea, sinus pressure, sneezing, sore throat and trouble swallowing.   Eyes: Negative for photophobia and visual disturbance.  Respiratory: Negative for cough, chest tightness, shortness of breath, wheezing and stridor.   Cardiovascular: Negative for chest pain, palpitations and leg swelling.  Gastrointestinal: Negative for nausea, vomiting, abdominal pain, diarrhea, constipation, blood in stool, abdominal distention and anal bleeding.  Genitourinary: Negative for dysuria, hematuria, flank pain and difficulty urinating.  Musculoskeletal: Positive for arthralgias. Negative for back pain, gait problem, joint swelling and myalgias.  Skin: Negative for color change, pallor, rash and wound.  Neurological: Negative for dizziness, tremors, weakness and light-headedness.  Hematological: Negative for adenopathy. Does not bruise/bleed easily.  Psychiatric/Behavioral: Negative for behavioral problems, confusion, sleep disturbance, dysphoric mood, decreased concentration and agitation.       Objective:   Physical Exam  Constitutional: He is oriented to person, place, and time. He appears well-developed and well-nourished. No distress.  HENT:  Head: Normocephalic and atraumatic.  Mouth/Throat: Oropharynx is clear and moist. No oropharyngeal exudate.  Eyes: Conjunctivae and EOM are normal. Pupils are equal, round, and reactive to light. No scleral icterus.  Neck: Normal range of motion. Neck supple. No JVD present.  Cardiovascular: Normal rate, regular rhythm and normal heart sounds.  Exam reveals no gallop and no friction rub.   No murmur heard. Pulmonary/Chest: Effort normal and breath sounds normal. No respiratory distress. He has no wheezes. He has no rales. He  exhibits no tenderness.  Abdominal: He exhibits no distension and no mass. There is no tenderness. There is no rebound and no guarding.  Musculoskeletal: He exhibits no edema and no tenderness.  Lymphadenopathy:     He has no cervical adenopathy.  Neurological: He is alert and oriented to person, place, and time. He has normal reflexes. He exhibits normal muscle tone. Coordination normal.  Skin: Skin is warm and dry. He is not diaphoretic. No erythema. No pallor.  Psychiatric: He has a normal mood and affect. His behavior is normal. Judgment and thought content normal.          Assessment & Plan:  HIV: Change to  Intelence and Truvada I spent greater than 45 minutes with the patient including greater than 50% of time in face to face counsel of the patient and in coordination of their care.   DM: BG are higher now, but keep in mind he had been off glipizide and metformin after weight loss and has now regained the weight we'll change his antiviral regimen around based on his concerns about TIVICAY and hyperglycemia. Recheck hemoglobin A1c. restart metformin and will likely have to add glipizide again  Hyperlipidemia  direct LDL is at goal.    Elevated triglycerides: Recheck these today although he has only been now fasting for 6 hours. May need to add gemfibrozil will also talk to pharmacy about whether or not a different statin would be helpful here certainly if his triglycerides are still in the thousands I would think we would have to add a sedimentation rate or niacin   Slight elevation in creatinine: This could be do to a pseudo-elevation do to mechanism of to tivicay. We'll recheck labs today  again   Benign prostatic hypertrophy : Not clear to me his back on an alpha antagonist .  Insomnia: rx ambien again

## 2014-01-17 ENCOUNTER — Ambulatory Visit: Payer: No Typology Code available for payment source | Admitting: Endocrinology

## 2014-01-23 ENCOUNTER — Telehealth: Payer: Self-pay | Admitting: *Deleted

## 2014-01-23 NOTE — Telephone Encounter (Signed)
Trying to get assistance for [name].  He has [employer] ins and his Intellence is not covered by his plan.  Spoke with Darnelle Maffucci.  She is calling Dr. Tommy Medal to see if there is another medication we can try.

## 2014-01-24 ENCOUNTER — Telehealth: Payer: Self-pay | Admitting: *Deleted

## 2014-01-24 ENCOUNTER — Other Ambulatory Visit: Payer: Self-pay | Admitting: *Deleted

## 2014-01-24 DIAGNOSIS — B20 Human immunodeficiency virus [HIV] disease: Secondary | ICD-10-CM

## 2014-01-24 MED ORDER — RALTEGRAVIR POTASSIUM 400 MG PO TABS: 400.0000 mg | ORAL_TABLET | Freq: Two times a day (BID) | ORAL | Status: DC

## 2014-01-24 NOTE — Telephone Encounter (Signed)
Will let Ricardo [name] know that we are switching his medication to Isentress 2x daily with the Truvada.

## 2014-01-24 NOTE — Telephone Encounter (Signed)
Message copied by Reggy Eye on Wed Jan 24, 2014 10:36 AM ------      Message from: Tommy Medal, CORNELIUS N      Created: Tue Jan 23, 2014  3:59 PM       Will the insurance cover [place].  IF so could change him to [place]  with meal 450 calories with fat in it and no PPI's.             How about STRIBILD with a meal or             Isentress twice daily with Truvada      ----- Message -----         From: Janyce Llanos, CMA         Sent: 01/23/2014   3:23 PM           To: Truman Hayward, MD            Dr Tommy Medal,            Pam has been working with this patient to try and get his insurance company to add Etravirine to their formulary and they advised they will not add it or cover it under any circumstances. She has been trying to get this done since you changed it. She is asking if we could change it to something else. She advised because the patient does have insurance PAN will not cover if the insurance will not carry it on their Formulary. The patient has [place]. Please advise what to do for this patient.            Darnelle Maffucci       ------

## 2014-02-23 ENCOUNTER — Other Ambulatory Visit: Payer: No Typology Code available for payment source

## 2014-02-27 ENCOUNTER — Other Ambulatory Visit: Payer: Self-pay | Admitting: *Deleted

## 2014-02-27 ENCOUNTER — Ambulatory Visit (INDEPENDENT_AMBULATORY_CARE_PROVIDER_SITE_OTHER): Payer: No Typology Code available for payment source | Admitting: Endocrinology

## 2014-02-27 ENCOUNTER — Encounter: Payer: Self-pay | Admitting: Endocrinology

## 2014-02-27 VITALS — BP 142/90 | HR 80 | Temp 98.6°F | Ht 72.0 in | Wt 248.0 lb

## 2014-02-27 DIAGNOSIS — E1149 Type 2 diabetes mellitus with other diabetic neurological complication: Secondary | ICD-10-CM

## 2014-02-27 DIAGNOSIS — E119 Type 2 diabetes mellitus without complications: Secondary | ICD-10-CM

## 2014-02-27 DIAGNOSIS — I517 Cardiomegaly: Secondary | ICD-10-CM

## 2014-02-27 MED ORDER — GLUCOSE BLOOD VI STRP: 1.0000 | ORAL_STRIP | Freq: Every day | Status: DC

## 2014-02-27 MED ORDER — RELION ULTIMA GLUCOSE SYSTEM W/DEVICE KIT: 1.0000 | PACK | Freq: Once | Status: DC

## 2014-02-27 MED ORDER — PIOGLITAZONE HCL 30 MG PO TABS: 30.0000 mg | ORAL_TABLET | Freq: Every day | ORAL | Status: DC

## 2014-02-27 MED ORDER — ATENOLOL 50 MG PO TABS: ORAL_TABLET | ORAL | Status: DC

## 2014-02-27 NOTE — Progress Notes (Signed)
Subjective:    Patient ID: Ricardo [name], male    DOB: 22-Feb-1961, 53 y.o.   MRN: [id number]  HPI pt states DM was dx'ed in 2005; after some initial weight loss, glucose returned to normal.  He has gradually regained the weight since then.  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  he has never been on insulin.  pt says his diet and exercise are "fair."  In late 2014, he was found to have vastly elevated TG and glucose in the 400's.  He was rx'ed metformin.  Since then, cbg's are still 100-200's.    Past Medical History  Diagnosis Date  . HIV (human immunodeficiency virus infection)   . Diabetes type 2, uncontrolled   . Hyperlipidemia   . Palpitations   . LVH (left ventricular hypertrophy)     No past surgical history on file.  History   Social History  . Marital Status: Married    Spouse Name: N/A    Number of Children: N/A  . Years of Education: N/A   Occupational History  . Not on file.   Social History Main Topics  . Smoking status: Never Smoker   . Smokeless tobacco: Never Used  . Alcohol Use: No  . Drug Use: No  . Sexual Activity: Yes    Partners: Female     Comment: pt. declined condoms   Other Topics Concern  . Not on file   Social History Narrative  . No narrative on file    Current Outpatient Prescriptions on File Prior to Visit  Medication Sig Dispense Refill  . aspirin 81 MG tablet Take 81 mg by mouth daily.        Marland Kitchen atenolol (TENORMIN) 50 MG tablet TAKE ONE TABLET BY MOUTH ONCE DAILY  30 tablet  5  . emtricitabine-tenofovir (TRUVADA) 200-300 MG per tablet Take 1 tablet by mouth daily.  30 tablet  6  . metFORMIN (GLUCOPHAGE) 1000 MG tablet Take 1 tablet (1,000 mg total) by mouth 2 (two) times daily with a meal.  60 tablet  11  . pravastatin (PRAVACHOL) 20 MG tablet Take 1 tablet (20 mg total) by mouth daily.  30 tablet  11  . raltegravir (ISENTRESS) 400 MG tablet Take 1  tablet (400 mg total) by mouth 2 (two) times daily.  60 tablet  5  . traMADol (ULTRAM) 50 MG tablet Take 1 tablet (50 mg total) by mouth at bedtime as needed.  30 tablet  1  . zolpidem (AMBIEN) 10 MG tablet Take 1 tablet (10 mg total) by mouth at bedtime as needed.  30 tablet  4  . zolpidem (AMBIEN) 10 MG tablet Take 1 tablet (10 mg total) by mouth at bedtime as needed for sleep.  30 tablet  4   No current facility-administered medications on file prior to visit.    Allergies  Allergen Reactions  . Amoxicillin-Pot Clavulanate     Family History  Problem Relation Age of Onset  . Coronary artery disease Father 84  DM: both parents  BP 142/90  Pulse 80  Temp(Src) 98.6 F (37 C) (Oral)  Ht 6' (1.829 m)  Wt 248 lb (112.492 kg)  BMI 33.63 kg/m2  SpO2 96%  Review of Systems denies fever, blurry vision, headache, chest pain, n/v, cramps, excessive diaphoresis, memory loss, depression, cold intolerance, rhinorrhea, and easy bruising.  He has slight doe and nocturia.  He reports intermittent swelling of the  legs.      Objective:   Physical Exam VS: see vs page GEN: no distress HEAD: head: no deformity eyes: no periorbital swelling, no proptosis external nose and ears are normal mouth: no lesion seen NECK: supple, thyroid is not enlarged CHEST WALL: no deformity LUNGS: clear to auscultation BREASTS:  No gynecomastia CV: reg rate and rhythm, no murmur ABD: abdomen is soft, nontender.  no hepatosplenomegaly.  not distended.  no hernia MUSCULOSKELETAL: muscle bulk and strength are grossly normal.  no obvious joint swelling.  gait is normal and steady PULSES: no carotid bruit NEURO:  cn 2-12 grossly intact.   readily moves all 4's.   SKIN:  Normal texture and temperature.  No rash or suspicious lesion is visible.   NODES:  None palpable at the neck PSYCH: alert, well-oriented.  Does not appear anxious nor depressed.  Lab Results  Component Value Date   HGBA1C 9.2* 01/04/2014       Assessment & Plan:  DM: this causes high risk to his health.  Control is improved, but he still needs increased rx Hypertriglyceridemia: in this setting, pioglitizone would be of value. HIV: on HAART, so he is at risk for lipodystrophy.  pioglitizone might prevent this. Edema: we'll have to watch this on pioglitizone.

## 2014-02-27 NOTE — Patient Instructions (Addendum)
good diet and exercise habits significanly improve the control of your diabetes.  please let me know if you wish to be referred to a dietician.  high blood sugar is very risky to your health.  you should see an eye doctor every year.  You are at higher than average risk for pneumonia and hepatitis-B.  You should be vaccinated against both.   controlling your blood pressure and cholesterol drastically reduces the damage diabetes does to your body.  this also applies to quitting smoking.  please discuss these with your doctor.  check your blood sugar once a day.  vary the time of day when you check, between before the 3 meals, and at bedtime.  also check if you have symptoms of your blood sugar being too high or too low.  please keep a record of the readings and bring it to your next appointment here.  You can write it on any piece of paper.  please call us sooner if your blood sugar goes below 70, or if you have a lot of readings over 200. i have sent a prescription to your pharmacy, to add "actos."  A side-effect of this is swelling of the legs.  Please call if this happens. Please come back for a follow-up appointment in 3 months.

## 2014-03-01 DIAGNOSIS — E1149 Type 2 diabetes mellitus with other diabetic neurological complication: Secondary | ICD-10-CM | POA: Insufficient documentation

## 2014-03-02 ENCOUNTER — Other Ambulatory Visit: Payer: Self-pay

## 2014-03-02 MED ORDER — GLUCOSE BLOOD VI STRP: ORAL_STRIP | Status: DC

## 2014-03-02 MED ORDER — ONETOUCH ULTRA 2 W/DEVICE KIT: PACK | Status: AC

## 2014-03-12 ENCOUNTER — Ambulatory Visit: Payer: No Typology Code available for payment source | Admitting: Infectious Disease

## 2014-03-30 ENCOUNTER — Other Ambulatory Visit: Payer: No Typology Code available for payment source

## 2014-03-30 DIAGNOSIS — E119 Type 2 diabetes mellitus without complications: Secondary | ICD-10-CM

## 2014-03-30 LAB — COMPLETE METABOLIC PANEL WITH GFR
ALBUMIN: 4 g/dL (ref 3.5–5.2)
ALT: 42 U/L (ref 0–53)
AST: 38 U/L — ABNORMAL HIGH (ref 0–37)
Alkaline Phosphatase: 63 U/L (ref 39–117)
BUN: 19 mg/dL (ref 6–23)
CALCIUM: 9.2 mg/dL (ref 8.4–10.5)
CHLORIDE: 102 meq/L (ref 96–112)
CO2: 26 meq/L (ref 19–32)
Creat: 0.95 mg/dL (ref 0.50–1.35)
GLUCOSE: 211 mg/dL — AB (ref 70–99)
Potassium: 4 mEq/L (ref 3.5–5.3)
Sodium: 134 mEq/L — ABNORMAL LOW (ref 135–145)
Total Bilirubin: 0.4 mg/dL (ref 0.2–1.2)
Total Protein: 6.9 g/dL (ref 6.0–8.3)

## 2014-03-30 LAB — LIPID PANEL
CHOLESTEROL: 199 mg/dL (ref 0–200)
HDL: 25 mg/dL — ABNORMAL LOW (ref >39)
Total CHOL/HDL Ratio: 8 Ratio
Triglycerides: 753 mg/dL — ABNORMAL HIGH (ref <150)

## 2014-03-30 LAB — T-HELPER CELL (CD4) - (RCID CLINIC ONLY)
CD4 T CELL HELPER: 26 % — AB (ref 33–55)
CD4 T Cell Abs: 650 /uL (ref 400–2700)

## 2014-04-02 LAB — HIV-1 RNA QUANT-NO REFLEX-BLD
HIV 1 RNA Quant: <20 copies/mL (ref <20)
HIV-1 RNA Quant, Log: <1.3 {Log} (ref <1.30)

## 2014-04-24 ENCOUNTER — Telehealth: Payer: Self-pay | Admitting: *Deleted

## 2014-04-24 NOTE — Telephone Encounter (Signed)
Refill request for tramadol, last filled 02/12/14. No showed last office visit and has follow up appt on 05/08/14. Please advise

## 2014-04-25 NOTE — Telephone Encounter (Signed)
If this is to be a chronic pain need he will need to fill out a pain contract and submit to drug testing. 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 What is this for and how often does he think he needs this?

## 2014-04-25 NOTE — Telephone Encounter (Signed)
Will inform patient to discuss at his upcoming appt with you. Myrtis Hopping

## 2014-05-08 ENCOUNTER — Ambulatory Visit: Payer: No Typology Code available for payment source | Admitting: Infectious Disease

## 2014-05-10 ENCOUNTER — Ambulatory Visit: Payer: No Typology Code available for payment source | Admitting: Infectious Disease

## 2014-05-20 IMAGING — US US SCROTUM
1 series · 13 of 25 positions shown · non-contrast
Comparison: None.

CLINICAL DATA: Testicular swelling. Epididymitis.

EXAM:
SCROTAL ULTRASOUND
DOPPLER ULTRASOUND OF THE TESTICLES
TECHNIQUE: Complete ultrasound examination of the testicles, epididymis, and
other scrotal structures was performed. Color and spectral Doppler
ultrasound were also utilized to evaluate blood flow to the
testicles.

[Series 1: us scrotum · 0.06mm/px · 13 of 82 slices shown]
[im 1/82]
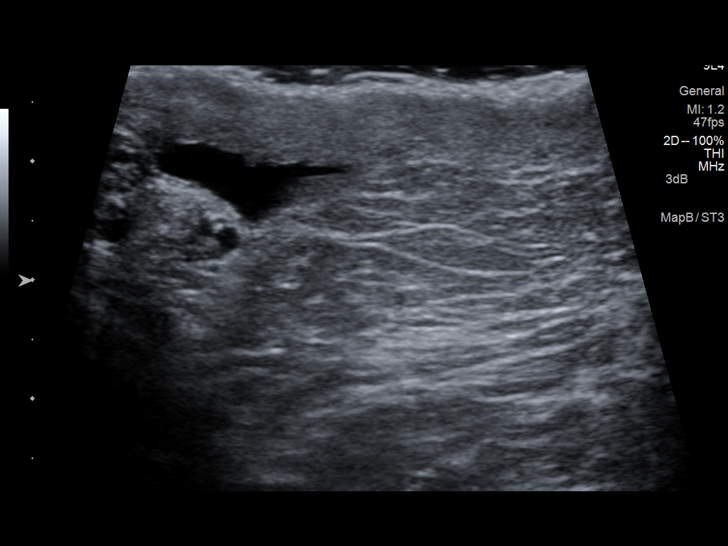
[im 7/82]
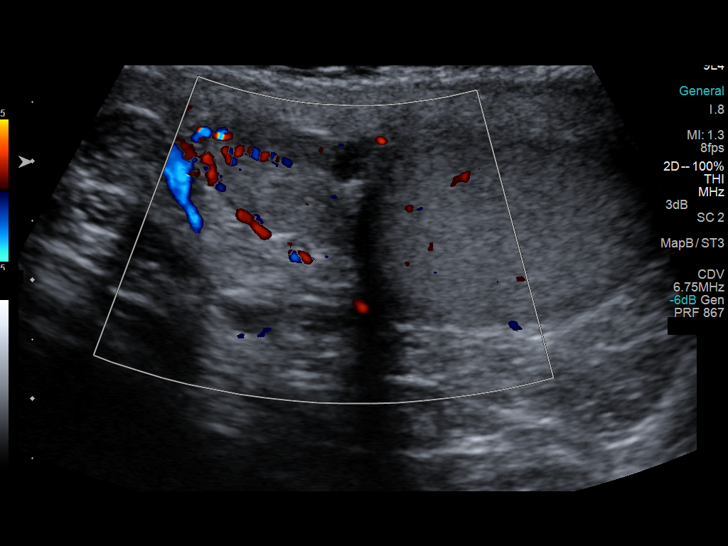
[im 14/82]
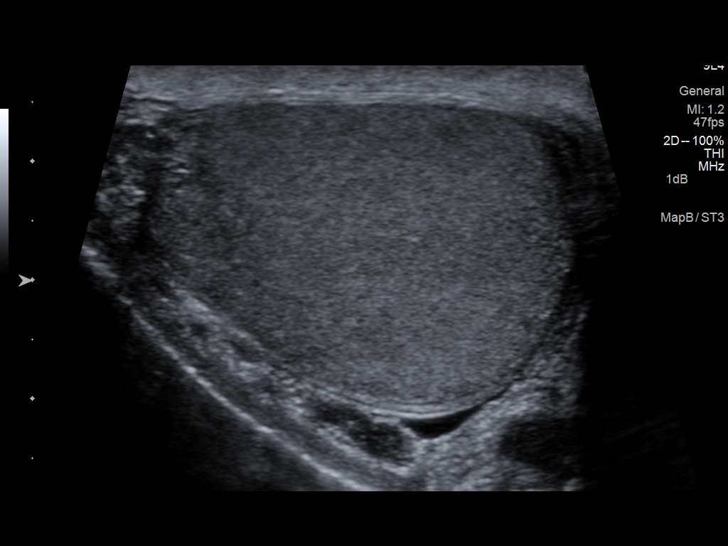
[im 21/82]
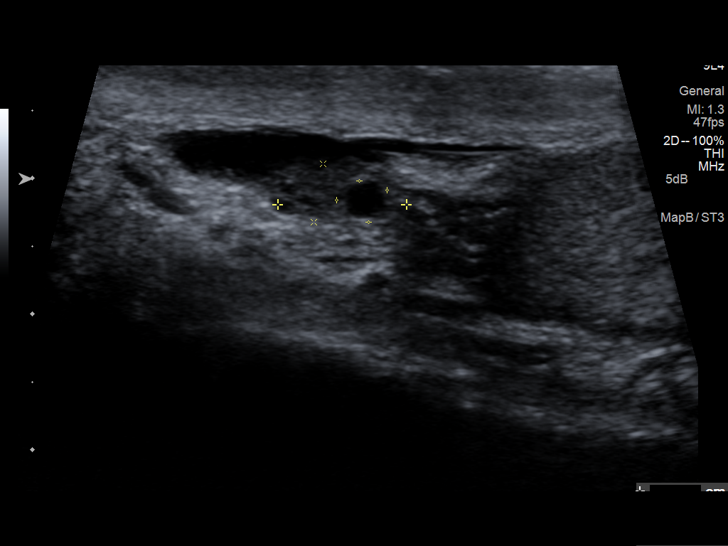
[im 28/82]
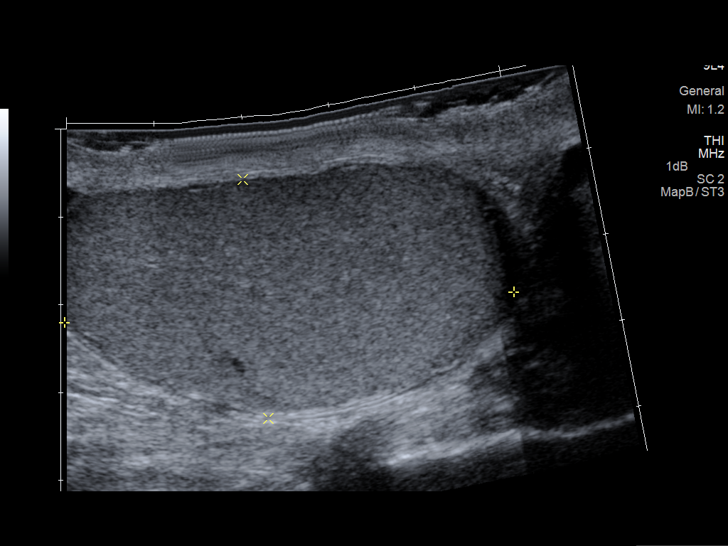
[im 34/82]
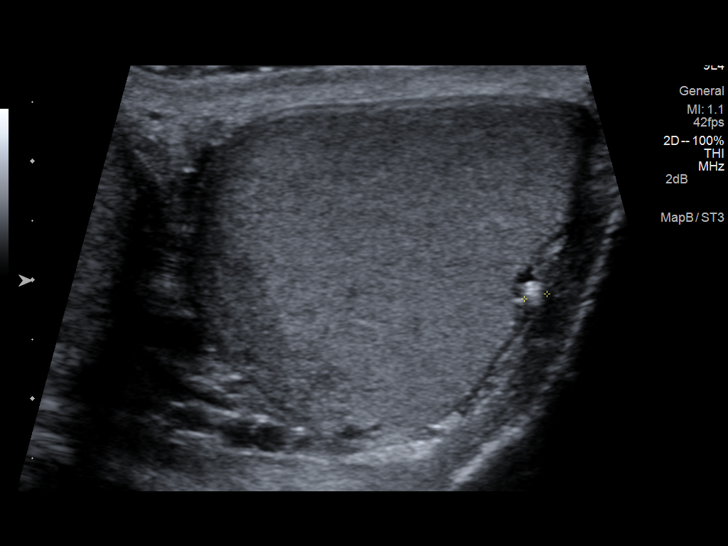
[im 41/82]
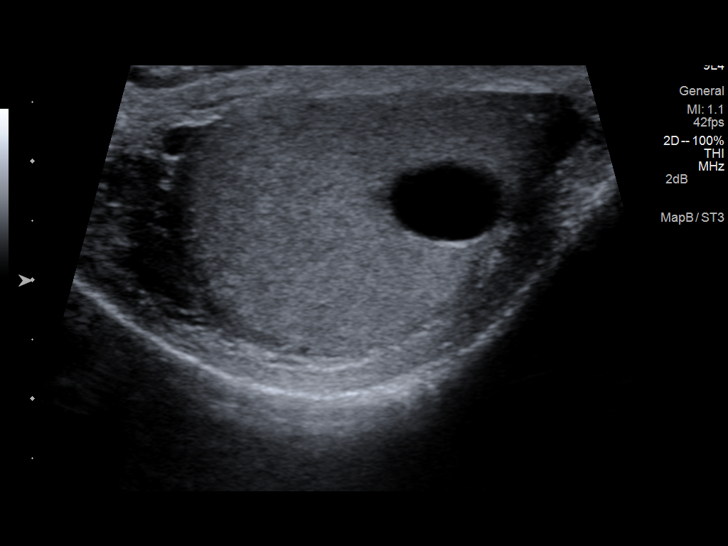
[im 48/82]
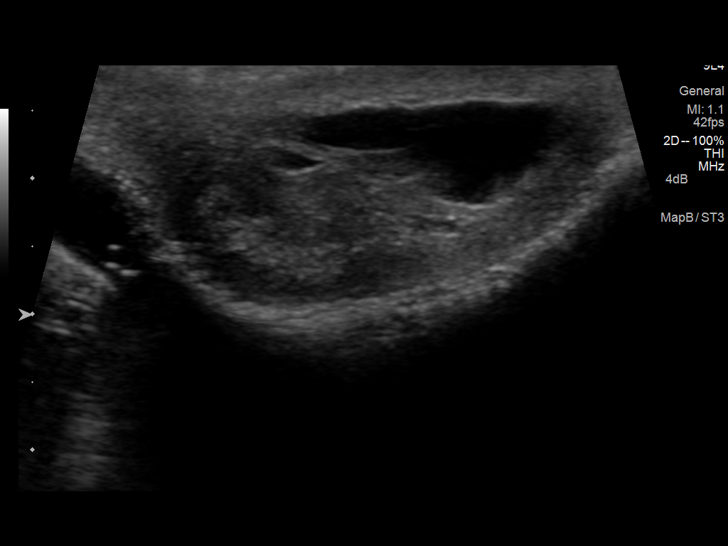
[im 55/82]
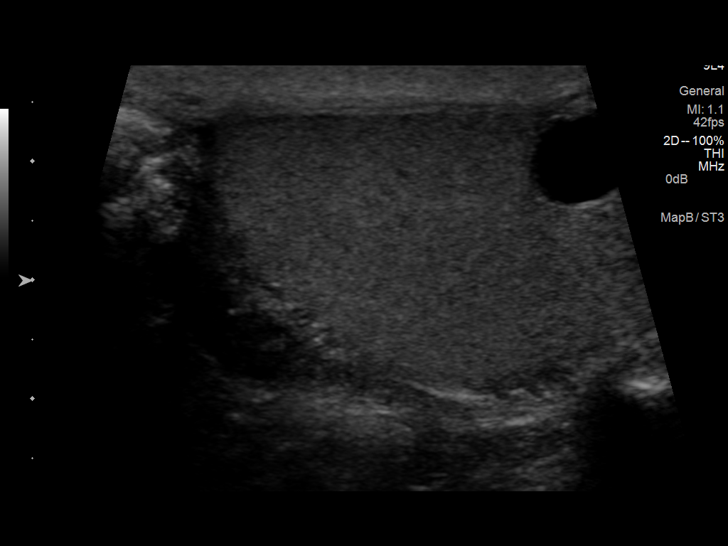
[im 61/82]
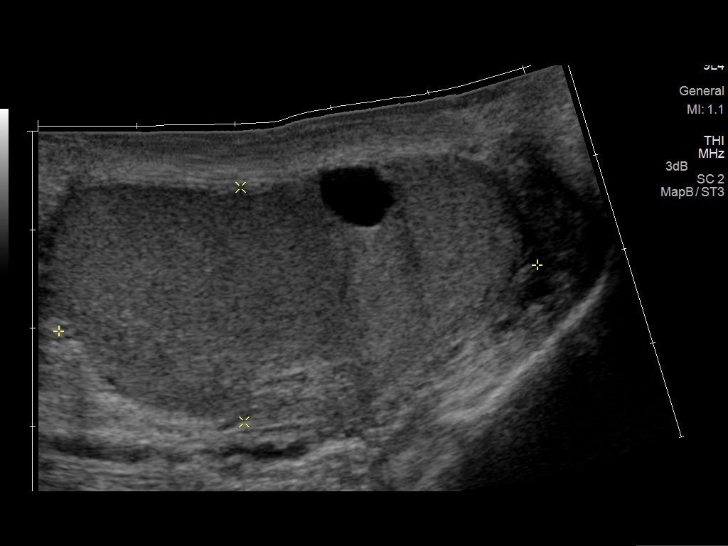
[im 68/82]
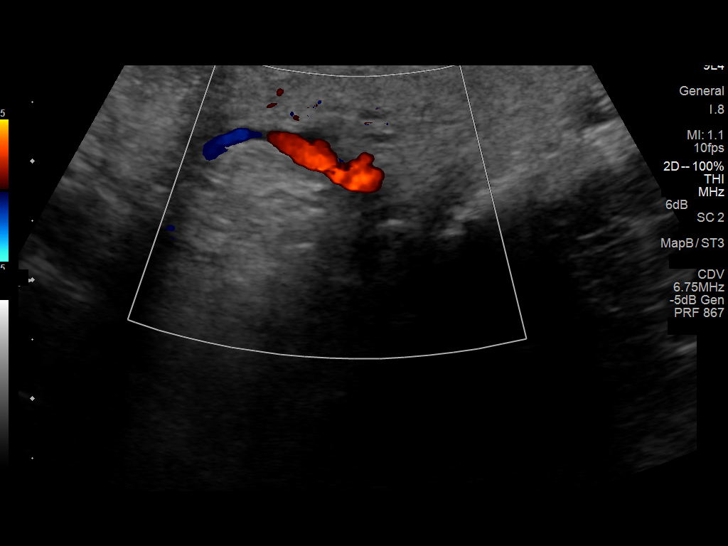
[im 75/82]
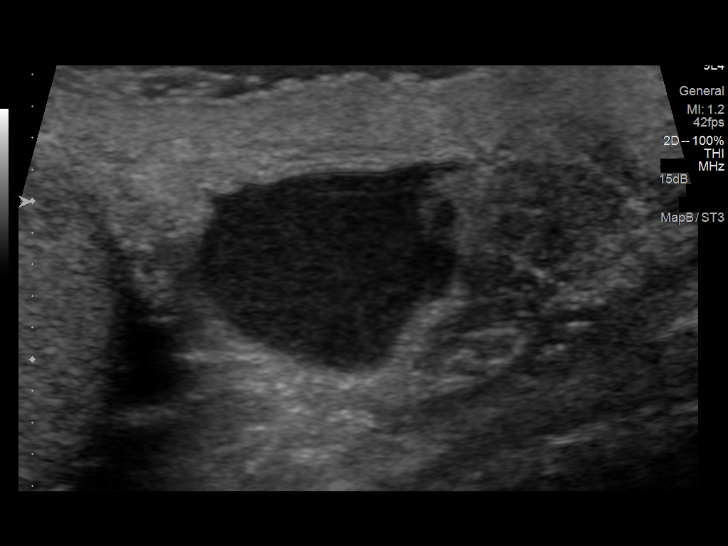
[im 82/82]
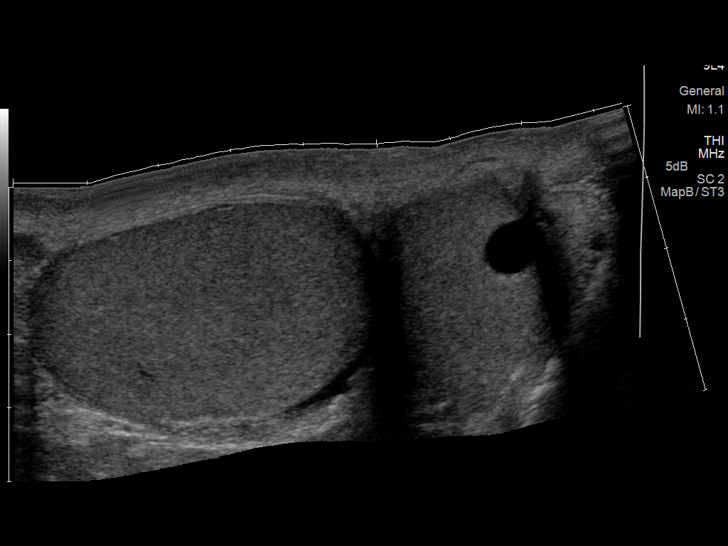

[13 of 25 positions shown; findings below may reference images not displayed]

FINDINGS: Right testis: Normal. 5.1 x 2.7 x 3.4 cm. Normal perfusion with
normal arterial and venous waveforms.

Left testis: 4.9 x 2.4 x 3.1 cm. 1 cm benign appearing cyst in the
testicle. Normal arterial and venous waveforms. Tiny calcification
on the periphery of the testicle, probably a benign scrotal pearl.

Right epididymis:  4 mm cyst. Otherwise normal.

Left epididymis: Slightly heterogeneous but there is no increased
perfusion.

Hydrocele: Small right hydrocele. 16 mm debris-filled cystic
structure in the left side of the scrotum, probably a small
spermatocele.

Varicocele:  Absent

Pulsed Doppler interrogation of both testes demonstrates low
resistance arterial and venous waveforms bilaterally.

The skin of the scrotum is thickened.
IMPRESSION: 1. Normal perfusion to both testicles. No significant abnormality of
either testicle.
2. Probable small left spermatocele.

## 2014-05-26 ENCOUNTER — Other Ambulatory Visit: Payer: Self-pay | Admitting: Infectious Disease

## 2014-06-21 ENCOUNTER — Other Ambulatory Visit: Payer: Self-pay | Admitting: *Deleted

## 2014-06-21 ENCOUNTER — Other Ambulatory Visit: Payer: Self-pay | Admitting: Infectious Disease

## 2014-06-21 DIAGNOSIS — B20 Human immunodeficiency virus [HIV] disease: Secondary | ICD-10-CM

## 2014-06-21 MED ORDER — RALTEGRAVIR POTASSIUM 400 MG PO TABS: 400.0000 mg | ORAL_TABLET | Freq: Two times a day (BID) | ORAL | Status: DC

## 2014-06-21 MED ORDER — EMTRICITABINE-TENOFOVIR DF 200-300 MG PO TABS: ORAL_TABLET | ORAL | Status: DC

## 2014-07-29 ENCOUNTER — Other Ambulatory Visit: Payer: Self-pay | Admitting: Infectious Disease

## 2014-07-30 ENCOUNTER — Telehealth: Payer: Self-pay | Admitting: *Deleted

## 2014-07-30 NOTE — Telephone Encounter (Signed)
Patient called and advised that is has run out of the [employer] foundation and needs to reapply. Advised him I will let Pam know and she will call him with what he needs to do or if they can continue to help him.

## 2014-07-31 ENCOUNTER — Telehealth: Payer: Self-pay | Admitting: *Deleted

## 2014-07-31 NOTE — Telephone Encounter (Signed)
Called to get information from patient regarding renewal of [employer].  Unable to leave message.  Mail box was full.

## 2014-07-31 NOTE — Telephone Encounter (Signed)
Spoke with [name].  I called the [employer].  He has been approved for another $4000.00 grant.

## 2014-08-15 ENCOUNTER — Ambulatory Visit (INDEPENDENT_AMBULATORY_CARE_PROVIDER_SITE_OTHER): Payer: No Typology Code available for payment source | Admitting: Infectious Disease

## 2014-08-15 ENCOUNTER — Encounter: Payer: Self-pay | Admitting: Infectious Disease

## 2014-08-15 VITALS — BP 126/81 | HR 70 | Temp 98.4°F | Wt 249.0 lb

## 2014-08-15 DIAGNOSIS — R002 Palpitations: Secondary | ICD-10-CM

## 2014-08-15 DIAGNOSIS — G4700 Insomnia, unspecified: Secondary | ICD-10-CM

## 2014-08-15 DIAGNOSIS — B20 Human immunodeficiency virus [HIV] disease: Secondary | ICD-10-CM

## 2014-08-15 DIAGNOSIS — E119 Type 2 diabetes mellitus without complications: Secondary | ICD-10-CM

## 2014-08-15 DIAGNOSIS — E781 Pure hyperglyceridemia: Secondary | ICD-10-CM

## 2014-08-15 DIAGNOSIS — G629 Polyneuropathy, unspecified: Secondary | ICD-10-CM

## 2014-08-15 DIAGNOSIS — Z23 Encounter for immunization: Secondary | ICD-10-CM

## 2014-08-15 DIAGNOSIS — I517 Cardiomegaly: Secondary | ICD-10-CM

## 2014-08-15 DIAGNOSIS — G589 Mononeuropathy, unspecified: Secondary | ICD-10-CM

## 2014-08-15 LAB — COMPLETE METABOLIC PANEL WITH GFR
ALT: 52 U/L (ref 0–53)
AST: 47 U/L — AB (ref 0–37)
Albumin: 4.5 g/dL (ref 3.5–5.2)
Alkaline Phosphatase: 56 U/L (ref 39–117)
BUN: 16 mg/dL (ref 6–23)
CALCIUM: 9 mg/dL (ref 8.4–10.5)
CHLORIDE: 104 meq/L (ref 96–112)
CO2: 26 meq/L (ref 19–32)
Creat: 1.04 mg/dL (ref 0.50–1.35)
GFR, Est Non African American: 82 mL/min
Glucose, Bld: 114 mg/dL — ABNORMAL HIGH (ref 70–99)
POTASSIUM: 4.1 meq/L (ref 3.5–5.3)
Sodium: 136 mEq/L (ref 135–145)
Total Bilirubin: 0.4 mg/dL (ref 0.2–1.2)
Total Protein: 7.4 g/dL (ref 6.0–8.3)

## 2014-08-15 LAB — CBC WITH DIFFERENTIAL/PLATELET
BASOS ABS: 0.1 10*3/uL (ref 0.0–0.1)
BASOS PCT: 1 % (ref 0–1)
EOS ABS: 0.2 10*3/uL (ref 0.0–0.7)
EOS PCT: 3 % (ref 0–5)
HCT: 44.8 % (ref 39.0–52.0)
Hemoglobin: 15.6 g/dL (ref 13.0–17.0)
Lymphocytes Relative: 47 % — ABNORMAL HIGH (ref 12–46)
Lymphs Abs: 2.4 10*3/uL (ref 0.7–4.0)
MCH: 31.7 pg (ref 26.0–34.0)
MCHC: 34.8 g/dL (ref 30.0–36.0)
MCV: 91.1 fL (ref 78.0–100.0)
Monocytes Absolute: 0.4 10*3/uL (ref 0.1–1.0)
Monocytes Relative: 7 % (ref 3–12)
Neutro Abs: 2.1 10*3/uL (ref 1.7–7.7)
Neutrophils Relative %: 42 % — ABNORMAL LOW (ref 43–77)
PLATELETS: 202 10*3/uL (ref 150–400)
RBC: 4.92 MIL/uL (ref 4.22–5.81)
RDW: 13.4 % (ref 11.5–15.5)
WBC: 5 10*3/uL (ref 4.0–10.5)

## 2014-08-15 LAB — HEMOGLOBIN A1C
Hgb A1c MFr Bld: 6.5 % — ABNORMAL HIGH (ref <5.7)
MEAN PLASMA GLUCOSE: 140 mg/dL — AB (ref <117)

## 2014-08-15 LAB — RPR

## 2014-08-15 MED ORDER — ZOLPIDEM TARTRATE 10 MG PO TABS: 10.0000 mg | ORAL_TABLET | Freq: Every evening | ORAL | Status: DC | PRN

## 2014-08-15 MED ORDER — PIOGLITAZONE HCL 30 MG PO TABS: 30.0000 mg | ORAL_TABLET | Freq: Every day | ORAL | Status: DC

## 2014-08-15 MED ORDER — ETRAVIRINE 200 MG PO TABS
400.0000 mg | ORAL_TABLET | Freq: Every day | ORAL | Status: DC
Start: 2014-08-15 — End: 2015-02-18

## 2014-08-15 MED ORDER — METFORMIN HCL 1000 MG PO TABS: 1000.0000 mg | ORAL_TABLET | Freq: Two times a day (BID) | ORAL | Status: DC

## 2014-08-15 NOTE — Progress Notes (Signed)
Subjective:    Patient ID: Ricardo [name], male    DOB: [birth year], 53 y.o.   MRN: [id number]  HPI   53 yo man with HIV who had been on  reyataz, norvir and truvada with perfect virological suppression and whom we changed to Tivicay and Truvada in December 2013.    On recheck of his viral load is undetectable CD4 count was quite high above 700.   We had also received recent blood work in December which showed history) only be 2538. When he was called about the elevated triglycerides he began to wonder if it was due to his new medications. Note his triglycerides have been 463 in May of 2014 he was RE on TIVICAY and Truvada. He also began to monitor his blood sugar now and notices blood sugars are running anywhere between 180-2 200 or 400.   NOTE HE HAD STOPPED METFORMIN AND GLIPIZIDE DUE TO WEIGHT LOSS AT THAT TIME AND A1C WAS AT 6.  In the interim he began reading about TIVICAY and also the tricyclic  antidepressant that would place him on amitriptyline and found mention of elevated blood sugars with both of these drugs. There is mention of other blood sugars and 5% of individuals taking TIVICAY also mention of this with amitriptyline.  We reviewed various other antiretroviral regimens to change him to any and opted for400 milligrams of Intelence once daily with Truvada.   I suspect on closer review of his chart that his blood sugars really or higher due to a combination of his weight gain his stopping metformin and his stopping glipizide.  He got back on metformin and was seen by Ricardo [name] who started pioglitazone. Pt incidentally HAS NEVER GONE BACK TO BID METFORMIN ANd was only on 1g daily when he saw Ricardo [name].  He returns for followup without complaints.      Review of Systems  Constitutional: Negative for fever, chills, diaphoresis, activity change, appetite change, fatigue and unexpected weight change.  HENT: Negative for congestion, rhinorrhea, sinus pressure, sneezing,  sore throat and trouble swallowing.   Eyes: Negative for photophobia and visual disturbance.  Respiratory: Negative for cough, chest tightness, shortness of breath, wheezing and stridor.   Cardiovascular: Negative for chest pain, palpitations and leg swelling.  Gastrointestinal: Negative for nausea, vomiting, abdominal pain, diarrhea, constipation, blood in stool, abdominal distention and anal bleeding.  Genitourinary: Negative for dysuria, hematuria, flank pain and difficulty urinating.  Musculoskeletal: Positive for arthralgias. Negative for back pain, gait problem, joint swelling and myalgias.  Skin: Negative for color change, pallor, rash and wound.  Neurological: Negative for dizziness, tremors, weakness and light-headedness.  Hematological: Negative for adenopathy. Does not bruise/bleed easily.  Psychiatric/Behavioral: Negative for behavioral problems, confusion, sleep disturbance, dysphoric mood, decreased concentration and agitation.       Objective:   Physical Exam  Constitutional: He is oriented to person, place, and time. He appears well-developed and well-nourished. No distress.  HENT:  Head: Normocephalic and atraumatic.  Mouth/Throat: Oropharynx is clear and moist. No oropharyngeal exudate.  Eyes: Conjunctivae and EOM are normal. Pupils are equal, round, and reactive to light. No scleral icterus.  Neck: Normal range of motion. Neck supple. No JVD present.  Cardiovascular: Normal rate, regular rhythm and normal heart sounds.  Exam reveals no gallop and no friction rub.   No murmur heard. Pulmonary/Chest: Effort normal and breath sounds normal. No respiratory distress. He has no wheezes. He has no rales. He exhibits no tenderness.  Abdominal: He exhibits no  distension and no mass. There is no tenderness. There is no rebound and no guarding.  Musculoskeletal: He exhibits no edema and no tenderness.  Lymphadenopathy:    He has no cervical adenopathy.  Neurological: He is alert  and oriented to person, place, and time. He has normal reflexes. He exhibits normal muscle tone. Coordination normal.  Skin: Skin is warm and dry. He is not diaphoretic. No erythema. No pallor.  Psychiatric: He has a normal mood and affect. His behavior is normal. Judgment and thought content normal.          Assessment & Plan:  HIV: continue  Intelence and Truvada  DM: I told him to please followup with Ricardo [name] I will cc him my note and make him aware of pt's unorthodox once daily metfomin dosing. Perhaps pt only needs BID metformin rather than metformin and pioglitazone? 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 Should try to lose weight again. Check A1c today as well.   Hyperlipidemia  direct LDL is at goal when checked in December   Insomnia: rx ambien again  LVH: bp better and on beta blocker, ? Right drug for him with his diabetes and if risk for hypoglycemia

## 2014-08-16 ENCOUNTER — Telehealth: Payer: Self-pay

## 2014-08-16 LAB — T-HELPER CELL (CD4) - (RCID CLINIC ONLY)
CD4 T CELL ABS: 600 /uL (ref 400–2700)
CD4 T CELL HELPER: 27 % — AB (ref 33–55)

## 2014-08-16 NOTE — Telephone Encounter (Signed)
Called pt to set up appointment. Pt states that he is leaving town for business and will be back in about a month. Pt states that once he gets back he will contact office to set appointment up.

## 2014-08-17 LAB — HIV-1 RNA QUANT-NO REFLEX-BLD
HIV 1 RNA Quant: 23 copies/mL — ABNORMAL HIGH (ref <20)
HIV-1 RNA QUANT, LOG: 1.36 {Log} — AB (ref <1.30)

## 2014-08-30 ENCOUNTER — Other Ambulatory Visit: Payer: Self-pay | Admitting: *Deleted

## 2014-08-30 DIAGNOSIS — G629 Polyneuropathy, unspecified: Secondary | ICD-10-CM

## 2014-08-30 DIAGNOSIS — Z23 Encounter for immunization: Secondary | ICD-10-CM

## 2014-08-30 DIAGNOSIS — G4700 Insomnia, unspecified: Secondary | ICD-10-CM

## 2014-08-30 DIAGNOSIS — E781 Pure hyperglyceridemia: Secondary | ICD-10-CM

## 2014-08-30 DIAGNOSIS — R002 Palpitations: Secondary | ICD-10-CM

## 2014-08-30 DIAGNOSIS — B20 Human immunodeficiency virus [HIV] disease: Secondary | ICD-10-CM

## 2014-08-30 DIAGNOSIS — E119 Type 2 diabetes mellitus without complications: Secondary | ICD-10-CM

## 2014-08-30 DIAGNOSIS — I517 Cardiomegaly: Secondary | ICD-10-CM

## 2014-08-30 MED ORDER — METFORMIN HCL 1000 MG PO TABS
1000.0000 mg | ORAL_TABLET | Freq: Two times a day (BID) | ORAL | Status: DC
Start: 2014-08-30 — End: 2014-12-12

## 2014-08-30 MED ORDER — ATENOLOL 50 MG PO TABS: ORAL_TABLET | ORAL | Status: DC

## 2014-08-31 ENCOUNTER — Other Ambulatory Visit: Payer: Self-pay | Admitting: Infectious Disease

## 2014-11-28 ENCOUNTER — Other Ambulatory Visit: Payer: No Typology Code available for payment source

## 2014-11-30 ENCOUNTER — Other Ambulatory Visit: Payer: Self-pay | Admitting: Infectious Disease

## 2014-12-12 ENCOUNTER — Encounter: Payer: Self-pay | Admitting: Infectious Disease

## 2014-12-12 ENCOUNTER — Other Ambulatory Visit: Payer: Self-pay | Admitting: *Deleted

## 2014-12-12 ENCOUNTER — Telehealth: Payer: Self-pay | Admitting: *Deleted

## 2014-12-12 ENCOUNTER — Ambulatory Visit (INDEPENDENT_AMBULATORY_CARE_PROVIDER_SITE_OTHER): Payer: 59 | Admitting: Infectious Disease

## 2014-12-12 VITALS — BP 145/75 | HR 75 | Temp 98.5°F | Wt 232.0 lb

## 2014-12-12 DIAGNOSIS — R002 Palpitations: Secondary | ICD-10-CM

## 2014-12-12 DIAGNOSIS — G4700 Insomnia, unspecified: Secondary | ICD-10-CM

## 2014-12-12 DIAGNOSIS — G629 Polyneuropathy, unspecified: Secondary | ICD-10-CM

## 2014-12-12 DIAGNOSIS — I517 Cardiomegaly: Secondary | ICD-10-CM

## 2014-12-12 DIAGNOSIS — E781 Pure hyperglyceridemia: Secondary | ICD-10-CM

## 2014-12-12 DIAGNOSIS — E119 Type 2 diabetes mellitus without complications: Secondary | ICD-10-CM

## 2014-12-12 DIAGNOSIS — E785 Hyperlipidemia, unspecified: Secondary | ICD-10-CM

## 2014-12-12 DIAGNOSIS — B20 Human immunodeficiency virus [HIV] disease: Secondary | ICD-10-CM

## 2014-12-12 DIAGNOSIS — Z23 Encounter for immunization: Secondary | ICD-10-CM

## 2014-12-12 LAB — CBC WITH DIFFERENTIAL/PLATELET
Basophils Absolute: 0.1 10*3/uL (ref 0.0–0.1)
Basophils Relative: 1 % (ref 0–1)
Eosinophils Absolute: 0.1 10*3/uL (ref 0.0–0.7)
Eosinophils Relative: 2 % (ref 0–5)
HEMATOCRIT: 44.8 % (ref 39.0–52.0)
Hemoglobin: 15.7 g/dL (ref 13.0–17.0)
LYMPHS PCT: 45 % (ref 12–46)
Lymphs Abs: 2.3 10*3/uL (ref 0.7–4.0)
MCH: 32.8 pg (ref 26.0–34.0)
MCHC: 35 g/dL (ref 30.0–36.0)
MCV: 93.5 fL (ref 78.0–100.0)
MONO ABS: 0.4 10*3/uL (ref 0.1–1.0)
MONOS PCT: 8 % (ref 3–12)
MPV: 9.8 fL (ref 9.4–12.4)
Neutro Abs: 2.2 10*3/uL (ref 1.7–7.7)
Neutrophils Relative %: 44 % (ref 43–77)
Platelets: 205 10*3/uL (ref 150–400)
RBC: 4.79 MIL/uL (ref 4.22–5.81)
RDW: 13.4 % (ref 11.5–15.5)
WBC: 5 10*3/uL (ref 4.0–10.5)

## 2014-12-12 LAB — LIPID PANEL
CHOL/HDL RATIO: 7.7 ratio
CHOLESTEROL: 223 mg/dL — AB (ref 0–200)
HDL: 29 mg/dL — ABNORMAL LOW (ref >39)
LDL Cholesterol: 134 mg/dL — ABNORMAL HIGH (ref 0–99)
Triglycerides: 298 mg/dL — ABNORMAL HIGH (ref <150)
VLDL: 60 mg/dL — ABNORMAL HIGH (ref 0–40)

## 2014-12-12 LAB — COMPLETE METABOLIC PANEL WITH GFR
ALBUMIN: 4.3 g/dL (ref 3.5–5.2)
ALK PHOS: 68 U/L (ref 39–117)
ALT: 102 U/L — AB (ref 0–53)
AST: 84 U/L — AB (ref 0–37)
BUN: 13 mg/dL (ref 6–23)
CO2: 24 mEq/L (ref 19–32)
CREATININE: 1.04 mg/dL (ref 0.50–1.35)
Calcium: 9.2 mg/dL (ref 8.4–10.5)
Chloride: 104 mEq/L (ref 96–112)
GFR, Est African American: >89 mL/min
GFR, Est Non African American: 82 mL/min
Glucose, Bld: 137 mg/dL — ABNORMAL HIGH (ref 70–99)
POTASSIUM: 4.5 meq/L (ref 3.5–5.3)
Sodium: 138 mEq/L (ref 135–145)
Total Bilirubin: 0.5 mg/dL (ref 0.2–1.2)
Total Protein: 7.2 g/dL (ref 6.0–8.3)

## 2014-12-12 LAB — HEMOGLOBIN A1C
HEMOGLOBIN A1C: 6.8 % — AB (ref <5.7)
Mean Plasma Glucose: 148 mg/dL — ABNORMAL HIGH (ref <117)

## 2014-12-12 LAB — RPR

## 2014-12-12 MED ORDER — METFORMIN HCL 1000 MG PO TABS: 1000.0000 mg | ORAL_TABLET | Freq: Two times a day (BID) | ORAL | Status: DC

## 2014-12-12 MED ORDER — PRAVASTATIN SODIUM 20 MG PO TABS: 20.0000 mg | ORAL_TABLET | Freq: Every day | ORAL | Status: DC

## 2014-12-12 MED ORDER — PIOGLITAZONE HCL 30 MG PO TABS: 30.0000 mg | ORAL_TABLET | Freq: Every day | ORAL | Status: DC

## 2014-12-12 NOTE — Progress Notes (Signed)
Subjective:    Patient ID: Ricardo [name], male    DOB: 1961/12/17, 53 y.o.   MRN: [id number]  HPI   53 yo man with HIV who had been on  reyataz, norvir and truvada with perfect virological suppression and whom we changed to Tivicay and Truvada in December 2013.    On recheck of his viral load is undetectable CD4 count was quite high above 700.   We had also received recent blood work in December which showed history) only be 2538. When he was called about the elevated triglycerides he began to wonder if it was due to his new medications. Note his triglycerides have been 463 in May of 2014 he was RE on TIVICAY and Truvada. He also began to monitor his blood sugar now and notices blood sugars are running anywhere between 180-2 200 or 400.   NOTE HE HAD STOPPED METFORMIN AND GLIPIZIDE DUE TO WEIGHT LOSS AT THAT TIME AND A1C WAS AT 6.  In the interim he began reading about TIVICAY and also the tricyclic  antidepressant that would place him on amitriptyline and found mention of elevated blood sugars with both of these drugs. There is mention of other blood sugars and 5% of individuals taking TIVICAY also mention of this with amitriptyline.  We reviewed various other antiretroviral regimens to change him to any and opted for400 milligrams of Intelence once daily with Truvada.   I suspected on closer review of his chart that his blood sugars really or higher due to a combination of his weight gain his stopping metformin and his stopping glipizide.  He got back on metformin and was seen by Dr. Loanne Drilling who started pioglitazone. Pt incidentally HAd NEVER GONE BACK TO BID METFORMIN ANd was only on 1g daily when he saw Dr. Loanne Drilling.  He returns for followup without complaints. He is telling methat his blood sugars are "looking good" Highest was 220. He has been out of the pioglitazone since last Wednesday and requested refills.      Review of Systems  Constitutional: Negative for fever, chills,  diaphoresis, activity change, appetite change, fatigue and unexpected weight change.  HENT: Negative for congestion, rhinorrhea, sinus pressure, sneezing, sore throat and trouble swallowing.   Eyes: Negative for photophobia and visual disturbance.  Respiratory: Negative for cough, chest tightness, shortness of breath, wheezing and stridor.   Cardiovascular: Negative for chest pain, palpitations and leg swelling.  Gastrointestinal: Negative for nausea, vomiting, abdominal pain, diarrhea, constipation, blood in stool, abdominal distention and anal bleeding.  Genitourinary: Negative for dysuria, hematuria, flank pain and difficulty urinating.  Musculoskeletal: Positive for arthralgias. Negative for myalgias, back pain, joint swelling and gait problem.  Skin: Negative for color change, pallor, rash and wound.  Neurological: Negative for dizziness, tremors, weakness and light-headedness.  Hematological: Negative for adenopathy. Does not bruise/bleed easily.  Psychiatric/Behavioral: Negative for behavioral problems, confusion, sleep disturbance, dysphoric mood, decreased concentration and agitation.       Objective:   Physical Exam  Constitutional: He is oriented to person, place, and time. He appears well-developed and well-nourished.  HENT:  Head: Normocephalic and atraumatic.  Eyes: Conjunctivae and EOM are normal.  Neck: Normal range of motion. Neck supple.  Cardiovascular: Normal rate and regular rhythm.   Pulmonary/Chest: Effort normal. No respiratory distress. He has no wheezes.  Abdominal: Soft. He exhibits no distension.  Musculoskeletal: Normal range of motion. He exhibits no edema or tenderness.  Neurological: He is alert and oriented to person, place, and time.  Skin: Skin is warm and dry. No rash noted. No erythema. No pallor.  Psychiatric: He has a normal mood and affect. His behavior is normal. Judgment and thought content normal.          Assessment & Plan:  HIV:  continue  Intelence and Truvada, recheck labs today  DM: I told him he DOES NEED TO  followup with Dr. Loanne Drilling in addition to me. I will refill his meds but we also need to make sure he is seeing an ophto MD and podiatrist etc  Hyperlipidemia  direct LDL is at goal when checked in December 2014, recheck fasting lipids today   LVH: bp  Worse today. Reassess compliance with anti-HTNsive meds

## 2014-12-12 NOTE — Telephone Encounter (Signed)
Patient called and advised he needs refills on Amitripyline, Atenolol and Ambien. Advised the patient since he was just here will have to ask the doctor if the refills are appropriate and give him a call back.

## 2014-12-13 LAB — T-HELPER CELL (CD4) - (RCID CLINIC ONLY)
CD4 % Helper T Cell: 27 % — ABNORMAL LOW (ref 33–55)
CD4 T Cell Abs: 640 /uL (ref 400–2700)

## 2014-12-13 NOTE — Telephone Encounter (Signed)
Those are fine to refill

## 2014-12-15 LAB — HIV-1 RNA QUANT-NO REFLEX-BLD
HIV 1 RNA Quant: 25 copies/mL — ABNORMAL HIGH (ref <20)
HIV-1 RNA QUANT, LOG: 1.4 {Log} — AB (ref <1.30)

## 2014-12-17 ENCOUNTER — Other Ambulatory Visit: Payer: Self-pay | Admitting: *Deleted

## 2014-12-17 DIAGNOSIS — G609 Hereditary and idiopathic neuropathy, unspecified: Secondary | ICD-10-CM

## 2014-12-17 DIAGNOSIS — G4700 Insomnia, unspecified: Secondary | ICD-10-CM

## 2014-12-17 DIAGNOSIS — I517 Cardiomegaly: Secondary | ICD-10-CM

## 2014-12-17 MED ORDER — ATENOLOL 50 MG PO TABS
ORAL_TABLET | ORAL | Status: DC
Start: 2014-12-17 — End: 2014-12-18

## 2014-12-18 ENCOUNTER — Encounter (HOSPITAL_COMMUNITY): Payer: Self-pay | Admitting: Emergency Medicine

## 2014-12-18 ENCOUNTER — Other Ambulatory Visit: Payer: Self-pay | Admitting: Licensed Clinical Social Worker

## 2014-12-18 ENCOUNTER — Telehealth: Payer: Self-pay | Admitting: *Deleted

## 2014-12-18 ENCOUNTER — Emergency Department (HOSPITAL_COMMUNITY)
Admission: EM | Admit: 2014-12-18 | Discharge: 2014-12-18 | Disposition: A | Discharge status: Home / Self Care | Payer: 59 | Attending: Emergency Medicine | Admitting: Emergency Medicine

## 2014-12-18 DIAGNOSIS — E119 Type 2 diabetes mellitus without complications: Secondary | ICD-10-CM | POA: Insufficient documentation

## 2014-12-18 DIAGNOSIS — G4700 Insomnia, unspecified: Secondary | ICD-10-CM

## 2014-12-18 DIAGNOSIS — Z8639 Personal history of other endocrine, nutritional and metabolic disease: Secondary | ICD-10-CM

## 2014-12-18 DIAGNOSIS — E781 Pure hyperglyceridemia: Secondary | ICD-10-CM

## 2014-12-18 DIAGNOSIS — Z7982 Long term (current) use of aspirin: Secondary | ICD-10-CM | POA: Diagnosis not present

## 2014-12-18 DIAGNOSIS — R002 Palpitations: Secondary | ICD-10-CM

## 2014-12-18 DIAGNOSIS — M79642 Pain in left hand: Secondary | ICD-10-CM | POA: Diagnosis present

## 2014-12-18 DIAGNOSIS — M792 Neuralgia and neuritis, unspecified: Secondary | ICD-10-CM

## 2014-12-18 DIAGNOSIS — E785 Hyperlipidemia, unspecified: Secondary | ICD-10-CM | POA: Insufficient documentation

## 2014-12-18 DIAGNOSIS — L03012 Cellulitis of left finger: Secondary | ICD-10-CM | POA: Insufficient documentation

## 2014-12-18 DIAGNOSIS — Z79899 Other long term (current) drug therapy: Secondary | ICD-10-CM | POA: Insufficient documentation

## 2014-12-18 DIAGNOSIS — Z880 Allergy status to penicillin: Secondary | ICD-10-CM | POA: Insufficient documentation

## 2014-12-18 DIAGNOSIS — B029 Zoster without complications: Secondary | ICD-10-CM | POA: Diagnosis not present

## 2014-12-18 DIAGNOSIS — Z21 Asymptomatic human immunodeficiency virus [HIV] infection status: Secondary | ICD-10-CM | POA: Insufficient documentation

## 2014-12-18 DIAGNOSIS — B20 Human immunodeficiency virus [HIV] disease: Secondary | ICD-10-CM

## 2014-12-18 DIAGNOSIS — G629 Polyneuropathy, unspecified: Secondary | ICD-10-CM

## 2014-12-18 DIAGNOSIS — Z8679 Personal history of other diseases of the circulatory system: Secondary | ICD-10-CM | POA: Diagnosis not present

## 2014-12-18 DIAGNOSIS — I517 Cardiomegaly: Secondary | ICD-10-CM

## 2014-12-18 DIAGNOSIS — B0089 Other herpesviral infection: Secondary | ICD-10-CM

## 2014-12-18 DIAGNOSIS — E0821 Diabetes mellitus due to underlying condition with diabetic nephropathy: Secondary | ICD-10-CM

## 2014-12-18 MED ORDER — ATENOLOL 50 MG PO TABS: ORAL_TABLET | ORAL | Status: DC

## 2014-12-18 MED ORDER — GABAPENTIN 100 MG PO CAPS: ORAL_CAPSULE | ORAL | Status: DC

## 2014-12-18 MED ORDER — ZOLPIDEM TARTRATE 10 MG PO TABS: 10.0000 mg | ORAL_TABLET | Freq: Every evening | ORAL | Status: DC | PRN

## 2014-12-18 MED ORDER — DOXYCYCLINE HYCLATE 100 MG PO CAPS: 100.0000 mg | ORAL_CAPSULE | Freq: Two times a day (BID) | ORAL | Status: DC

## 2014-12-18 MED ORDER — AMITRIPTYLINE HCL 25 MG PO TABS: 25.0000 mg | ORAL_TABLET | Freq: Every day | ORAL | Status: DC

## 2014-12-18 MED ORDER — VALACYCLOVIR HCL 1 G PO TABS: 1000.0000 mg | ORAL_TABLET | Freq: Three times a day (TID) | ORAL | Status: AC

## 2014-12-18 MED ORDER — HYDROCODONE-ACETAMINOPHEN 5-325 MG PO TABS: 1.0000 | ORAL_TABLET | Freq: Four times a day (QID) | ORAL | Status: DC | PRN

## 2014-12-18 NOTE — Discharge Instructions (Signed)
Take the antibiotic doxycycline as directed, and until completed. Continue your usual home medications. Use gabapentin for pain relief, and vicodin as needed for severe pain. Don't drive while taking vicodin. Use valtrex to help with your infection. Get plenty of rest, drink plenty of fluids, and perform warm water soaks for 5-10 mins in Half-strength hydrogen peroxide, twice daily x 5-7 days. Please followup with the hand specialist for ongoing care. Watch for increasing pain, swelling, drainage/pus, or fever, and return to the emergency department if any of these symptoms occur.    Fingertip Infection When an infection is around the nail, it is called a paronychia. When it appears over the tip of the finger, it is called a felon. These infections are due to minor injuries or cracks in the skin. If they are not treated properly, they can lead to bone infection and permanent damage to the fingernail. Incision and drainage is necessary if a pus pocket (an abscess) has formed. Antibiotics and pain medicine may also be needed. Keep your hand elevated for the next 2-3 days to reduce swelling and pain. If a pack was placed in the abscess, it should be removed in 1-2 days by your caregiver. Soak the finger in warm water for 20 minutes 4 times daily to help promote drainage. Keep the hands as dry as possible. Wear protective gloves with cotton liners. See your caregiver for follow-up care as recommended.  HOME CARE INSTRUCTIONS   Keep wound clean, dry and dressed as suggested by your caregiver.  Soak in warm salt water for fifteen minutes, four times per day for bacterial infections.  Your caregiver will prescribe an antibiotic if a bacterial infection is suspected. Take antibiotics as directed and finish the prescription, even if the problem appears to be improving before the medicine is gone.  Only take over-the-counter or prescription medicines for pain, discomfort, or fever as directed by your  caregiver. SEEK IMMEDIATE MEDICAL CARE IF:  There is redness, swelling, or increasing pain in the wound.  Pus or any other unusual drainage is coming from the wound.  An unexplained oral temperature above 102 F (38.9 C) develops.  You notice a foul smell coming from the wound or dressing. MAKE SURE YOU:   Understand these instructions.  Monitor your condition.  Contact your caregiver if you are getting worse or not improving. Document Released: 01/14/2005 Document Revised: 02/29/2012 Document Reviewed: 01/10/2009 [facility] Patient Information 2015 [place], [place]. This information is not intended to replace advice given to you by your health care provider. Make sure you discuss any questions you have with your health care provider.  Herpetic Whitlow Herpetic whitlow is a painful infection of the hand. It can involve 1 or more fingers. It usually affects the end of the finger. This is caused by the Herpes simplex virus 1 (HSV-1) and herpes simplex virus 2 (HSV-2). It is an occupational risk among health care workers.  Herpetic whitlow is characterized by a starting infection, which may be followed by a problem-free period but with future recurrences. After the initial infection, the virus enters nerve endings and lies dormant in those nerves. The primary infection usually is the most troublesome. Recurrences observed in 20-50% of cases are usually milder and shorter in duration. Once nerves are infected with herpes virus they are thought to contain that virus for the rest of your life. CAUSES  Males and females are affected equally by herpetic whitlow. In health care workers, infection with HSV-1 is most common. It comes from  exposure to infected secretions from the mouths of patients. Herpetic whitlow is started by exposure to infected body fluids. The virus gets in through a break in the skin. This could be any small thing such as a torn cuticle. The virus then invades the skin cells. Signs  of infection show in days. In children, HSV-1 is the most likely cause. Infection involving the finger usually is due to finger-sucking or thumb-sucking in patients with herpes infection. Toddlers and preschool children are most likely to engage in thumb-sucking or finger-sucking behavior. They are susceptible to herpetic whitlow if they have herpes infection of the mouth. SYMPTOMS   Following exposure, problems usually develop within 2-20 days (incubation period). Sometimes fever and sleepiness are observed. Most often initial symptoms are pain and burning or tingling of the infected digit.  This usually is followed by redness, swelling. There will be development of rice sized vesicles on a red base over the next 7-10 days.  These vesicles may ulcerate or break. They usually contain clear fluid. But the fluid may appear cloudy or bloody. Inflammation of the lymph channels which return the body fluids to the heart and lymph nodes (swollen glands) are common. After 10-14 days, symptoms usually improve. The sores (lesions) crust over and heal.  The infectious phase is believed to be over at this point. Complete resolution happens over the next 5-7 days.  Problems from this infection are usually related to secondary infections. Complications may include delayed resolution, bacterial overgrowth. These rarely spread throughout the body with serious consequences. DIAGNOSIS  The diagnosis is usually easily made on physical exam. Sometimes lab work is needed. HOME CARE INSTRUCTIONS   Only take over-the-counter or prescription medicines for pain, discomfort, or fever as directed by your caregiver. Do not use aspirin.  Do not touch the blisters or pick the scabs. Wash your hands often. Do not touch your eyes, mouth or genital areas without washing your hands first. Do not share towels and washcloths.  Apply an ice pack to the sore area for discomfort.  This infection is contagious. Avoid close contact  with other people until blisters heal. This can be transferred to both the mouth and the genital area.  Eat a well-balanced diet.  This problem can be prevented by use of gloves. Observe fluid precautions if you are handling people.  In the general adult population, herpetic whitlow is most often from yourself. It is most frequently secondary to infection with HSV-2. MAKE SURE YOU:   Understand these instructions.  Will watch your condition.  Will get help right away if you are not doing well or get worse. Document Released: 02/27/2003 Document Revised: 04/23/2014 Document Reviewed: 07/26/2008 [facility] Patient Information 2015 [place], [place]. This information is not intended to replace advice given to you by your health care provider. Make sure you discuss any questions you have with your health care provider.  Paronychia Paronychia is an inflammatory reaction involving the folds of the skin surrounding the fingernail. This is commonly caused by an infection in the skin around a nail. The most common cause of paronychia is frequent wetting of the hands (as seen with bartenders, food servers, nurses or others who wet their hands). This makes the skin around the fingernail susceptible to infection by bacteria (germs) or fungus. Other predisposing factors are:  Aggressive manicuring.  Nail biting.  Thumb sucking. The most common cause is a staphylococcal (a type of germ) infection, or a fungal (Candida) infection. When caused by a germ, it usually  comes on suddenly with redness, swelling, pus and is often painful. It may get under the nail and form an abscess (collection of pus), or form an abscess around the nail. If the nail itself is infected with a fungus, the treatment is usually prolonged and may require oral medicine for up to one year. Your caregiver will determine the length of time treatment is required. The paronychia caused by bacteria (germs) may largely be avoided by not pulling  on hangnails or picking at cuticles. When the infection occurs at the tips of the finger it is called felon. When the cause of paronychia is from the herpes simplex virus (HSV) it is called herpetic whitlow. TREATMENT  When an abscess is present treatment is often incision and drainage. This means that the abscess must be cut open so the pus can get out. When this is done, the following home care instructions should be followed. HOME CARE INSTRUCTIONS   It is important to keep the affected fingers very dry. Rubber or plastic gloves over cotton gloves should be used whenever the hand must be placed in water.  Keep wound clean, dry and dressed as suggested by your caregiver between warm soaks or warm compresses.  Soak in warm water for fifteen to twenty minutes three to four times per day for bacterial infections. Fungal infections are very difficult to treat, so often require treatment for long periods of time.  For bacterial (germ) infections take antibiotics (medicine which kill germs) as directed and finish the prescription, even if the problem appears to be solved before the medicine is gone.  Only take over-the-counter or prescription medicines for pain, discomfort, or fever as directed by your caregiver. SEEK IMMEDIATE MEDICAL CARE IF:  You have redness, swelling, or increasing pain in the wound.  You notice pus coming from the wound.  You have a fever.  You notice a bad smell coming from the wound or dressing. Document Released: 06/02/2001 Document Revised: 02/29/2012 Document Reviewed: 02/01/2009 [facility] Patient Information 2015 [place], [place]. This information is not intended to replace advice given to you by your health care provider. Make sure you discuss any questions you have with your health care provider.  Neuropathic Pain We often think that pain has a physical cause. If we get rid of the cause, the pain should go away. Nerves themselves can also cause pain. It is called  neuropathic pain, which means nerve abnormality. It may be difficult for the patients who have it and for the treating caregivers. Pain is usually described as acute (short-lived) or chronic (long-lasting). Acute pain is related to the physical sensations caused by an injury. It can last from a few seconds to many weeks, but it usually goes away when normal healing occurs. Chronic pain lasts beyond the typical healing time. With neuropathic pain, the nerve fibers themselves may be damaged or injured. They then send incorrect signals to other pain centers. The pain you feel is real, but the cause is not easy to find.  CAUSES  Chronic pain can result from diseases, such as diabetes and shingles (an infection related to chickenpox), or from trauma, surgery, or amputation. It can also happen without any known injury or disease. The nerves are sending pain messages, even though there is no identifiable cause for such messages.   Other common causes of neuropathy include diabetes, phantom limb pain, or Regional Pain Syndrome (RPS).  As with all forms of chronic back pain, if neuropathy is not correctly treated, there can be  a number of associated problems that lead to a downward cycle for the patient. These include depression, sleeplessness, feelings of fear and anxiety, limited social interaction and inability to do normal daily activities or work.  The most dramatic and mysterious example of neuropathic pain is called "phantom limb syndrome." This occurs when an arm or a leg has been removed because of illness or injury. The brain still gets pain messages from the nerves that originally carried impulses from the missing limb. These nerves now seem to misfire and cause troubling pain.  Neuropathic pain often seems to have no cause. It responds poorly to standard pain treatment. Neuropathic pain can occur after:  Shingles (herpes zoster virus infection).  A lasting burning sensation of the skin, caused  usually by injury to a peripheral nerve.  Peripheral neuropathy which is widespread nerve damage, often caused by diabetes or alcoholism.  Phantom limb pain following an amputation.  Facial nerve problems (trigeminal neuralgia).  Multiple sclerosis.  Reflex sympathetic dystrophy.  Pain which comes with cancer and cancer chemotherapy.  Entrapment neuropathy such as when pressure is put on a nerve such as in carpal tunnel syndrome.  Back, leg, and hip problems (sciatica).  Spine or back surgery.  HIV Infection or AIDS where nerves are infected by viruses. Your caregiver can explain items in the above list which may apply to you. SYMPTOMS  Characteristics of neuropathic pain are:  Severe, sharp, electric shock-like, shooting, lightening-like, knife-like.  Pins and needles sensation.  Deep burning, deep cold, or deep ache.  Persistent numbness, tingling, or weakness.  Pain resulting from light touch or other stimulus that would not usually cause pain.  Increased sensitivity to something that would normally cause pain, such as a pinprick. Pain may persist for months or years following the healing of damaged tissues. When this happens, pain signals no longer sound an alarm about current injuries or injuries about to happen. Instead, the alarm system itself is not working correctly.  Neuropathic pain may get worse instead of better over time. For some people, it can lead to serious disability. It is important to be aware that severe injury in a limb can occur without a proper, protective pain response.Burns, cuts, and other injuries may go unnoticed. Without proper treatment, these injuries can become infected or lead to further disability. Take any injury seriously, and consult your caregiver for treatment. DIAGNOSIS  When you have a pain with no known cause, your caregiver will probably ask some specific questions:   Do you have any other conditions, such as diabetes, shingles,  multiple sclerosis, or HIV infection?  How would you describe your pain? (Neuropathic pain is often described as shooting, stabbing, burning, or searing.)  Is your pain worse at any time of the day? (Neuropathic pain is usually worse at night.)  Does the pain seem to follow a certain physical pathway?  Does the pain come from an area that has missing or injured nerves? (An example would be phantom limb pain.)  Is the pain triggered by minor things such as rubbing against the sheets at night? These questions often help define the type of pain involved. Once your caregiver knows what is happening, treatment can begin. Anticonvulsant, antidepressant drugs, and various pain relievers seem to work in some cases. If another condition, such as diabetes is involved, better management of that disorder may relieve the neuropathic pain.  TREATMENT  Neuropathic pain is frequently long-lasting and tends not to respond to treatment with narcotic type pain medication.  It may respond well to other drugs such as antiseizure and antidepressant medications. Usually, neuropathic problems do not completely go away, but partial improvement is often possible with proper treatment. Your caregivers have large numbers of medications available to treat you. Do not be discouraged if you do not get immediate relief. Sometimes different medications or a combination of medications will be tried before you receive the results you are hoping for. See your caregiver if you have pain that seems to be coming from nowhere and does not go away. Help is available.  SEEK IMMEDIATE MEDICAL CARE IF:   There is a sudden change in the quality of your pain, especially if the change is on only one side of the body.  You notice changes of the skin, such as redness, black or purple discoloration, swelling, or an ulcer.  You cannot move the affected limbs. Document Released: 09/03/2004 Document Revised: 02/29/2012 Document Reviewed:  09/03/2004 [facility] Patient Information 2015 [place], [place]. This information is not intended to replace advice given to you by your health care provider. Make sure you discuss any questions you have with your health care provider.  Shingles Shingles is caused by the same virus that causes chickenpox. The first feelings may be pain or tingling. A rash will follow in a couple days. The rash may occur on any area of the body. Long-lasting pain is more likely in an elderly person. It can last months to years. There are medicines that can help prevent pain if you start taking them early. HOME CARE   Take cool baths or place cool cloths on the rash as told by your doctor.  Take medicine only as told by your doctor.  Rest as told by your doctor.  Keep your rash clean with mild soap and cool water or as told by your doctor.  Do not scratch your rash. You may use calamine lotion to relieve itchy skin as told by your doctor.  Keep your rash covered with a loose bandage (dressing).  Avoid touching:  Babies.  Pregnant women.  Children with inflamed skin (eczema).  People who have gotten organ transplants.  People with chronic illnesses, such as leukemia or AIDS.  Wear loose-fitting clothing.  If the rash is on the face, you may need to see a specialist. Keep all appointments. Shingles must be kept away from the eyes, if possible.  Keep all follow-up visits as told by your doctor. GET HELP RIGHT AWAY IF:   You have any pain on the face or eye.  You lose feeling on one side of your face.  You have ear pain or ringing in your ear.  You cannot taste as well.  Your medicines do not help the pain.  Your redness or puffiness (swelling) spreads.  You feel like you are getting worse.  You have a fever. MAKE SURE YOU:   Understand these instructions.  Will watch your condition.  Will get help right away if you are not doing well or get worse. Document Released: 05/25/2008  Document Revised: 04/23/2014 Document Reviewed: 05/25/2008 [facility] Patient Information 2015 [place], [place]. This information is not intended to replace advice given to you by your health care provider. Make sure you discuss any questions you have with your health care provider.

## 2014-12-18 NOTE — Telephone Encounter (Signed)
Patient called asking for advice.  He states he has had a cluster of pimples pop up on the medial side of his left hand.  He describes them as painful to the touch ("light lightening") and has clear liquid coming out of it when he ruptures it.  RN advised patient to go to have it evaluated, not wait for an appointment at [facility] as there are no available physicians this week.  Pt agreed. Landis Gandy, RN

## 2014-12-18 NOTE — ED Provider Notes (Signed)
CSN: [id number]     Arrival date & time 12/18/14  1313 History  This chart was scribed for non-physician practitioner, Zacarias Pontes, PA-C working with Wandra Arthurs, MD by Frederich Balding, ED scribe. This patient was seen in room TR09C/TR09C and the patient's care was started at 2:19 PM.   Chief Complaint  Patient presents with  . Hand Pain   Patient is a 53 y.o. male presenting with hand pain. The history is provided by the patient. No language interpreter was used.  Hand Pain This is a new problem. The current episode started more than 2 days ago. The problem occurs constantly. The problem has not changed since onset.Pertinent negatives include no chest pain, no abdominal pain and no shortness of breath. Exacerbated by: pressure. Nothing relieves the symptoms. Treatments tried: neosporin. The treatment provided no relief.    HPI Comments: Ricardo [name] is a 53 y.o. male with history of HIV and diabetes who presents to the Emergency Department complaining of constant left fourth finger pain with associated worsening swelling at the tip that started 4 days ago. Also reports worsening redness. Rates pain 8/10 and describes it as aching and sharp. It radiates into his hand, and pt stated a red/purple streak developed along lateral finger. Pressure over the area worsens the pain. He has used neosporin over the area with no relief. States he tried to drain the area himself with nail clippers and was unsuccessful. He also has a rash similar to the one on his finger tip, along the medial hand. Pt bites his nails. Denies fever, chest pain, SOB, abdominal pain, nausea, emesis, weakness, numbness or tingling.   Past Medical History  Diagnosis Date  . HIV (human immunodeficiency virus infection)   . Diabetes type 2, uncontrolled   . Hyperlipidemia   . Palpitations   . LVH (left ventricular hypertrophy)   . Diabetes mellitus without complication    History reviewed. No pertinent past surgical  history. Family History  Problem Relation Age of Onset  . Coronary artery disease Father 15   History  Substance Use Topics  . Smoking status: Never Smoker   . Smokeless tobacco: Never Used  . Alcohol Use: No    Review of Systems  Constitutional: Negative for fever and chills.  Respiratory: Negative for shortness of breath.   Cardiovascular: Negative for chest pain.  Gastrointestinal: Negative for nausea, vomiting, abdominal pain and diarrhea.  Musculoskeletal: Positive for joint swelling and arthralgias.  Skin: Positive for color change and rash (L hand/finger).  Allergic/Immunologic: Positive for immunocompromised state (HIV and DM).  Neurological: Negative for weakness and numbness.  10 systems reviewed and are negative for acute changes except as noted in the HPI.  Allergies  Amoxicillin-pot clavulanate  Home Medications   Prior to Admission medications   Medication Sig Start Date End Date Taking? Authorizing Provider  amitriptyline (ELAVIL) 25 MG tablet Take 1 tablet (25 mg total) by mouth at bedtime. 12/18/14   Truman Hayward, MD  aspirin 81 MG tablet Take 81 mg by mouth daily.      Historical Provider, MD  atenolol (TENORMIN) 50 MG tablet TAKE ONE TABLET BY MOUTH ONCE DAILY 12/18/14   Truman Hayward, MD  Blood Glucose Monitoring Suppl (ONE TOUCH ULTRA 2) W/DEVICE KIT Use to check blood sugars 1/day. 03/02/14   Renato Shin, MD  Etravirine 200 MG TABS Take 2 tablets (400 mg total) by mouth daily. 08/15/14   Truman Hayward, MD  glucose  blood (ONE TOUCH ULTRA TEST) test strip Use to check blood sugars 1/day. 03/02/14   Renato Shin, MD  metFORMIN (GLUCOPHAGE) 1000 MG tablet Take 1 tablet (1,000 mg total) by mouth 2 (two) times daily with a meal. 12/12/14   Truman Hayward, MD  pioglitazone (ACTOS) 30 MG tablet Take 1 tablet (30 mg total) by mouth daily. 12/12/14   Truman Hayward, MD  pravastatin (PRAVACHOL) 20 MG tablet Take 1 tablet (20 mg total) by  mouth daily. 12/12/14   Truman Hayward, MD  traMADol (ULTRAM) 50 MG tablet Take 1 tablet (50 mg total) by mouth at bedtime as needed. 01/04/14   Truman Hayward, MD  TRUVADA 200-300 MG per tablet TAKE 1 TABLET BY MOUTH EVERY DAY    Truman Hayward, MD  zolpidem (AMBIEN) 10 MG tablet Take 1 tablet (10 mg total) by mouth at bedtime as needed. Patient not taking: Reported on 12/12/2014 08/15/14   Truman Hayward, MD  zolpidem (AMBIEN) 10 MG tablet Take 1 tablet (10 mg total) by mouth at bedtime as needed for sleep. 12/18/14 01/17/15  Truman Hayward, MD   BP 135/84 mmHg  Pulse 95  Temp(Src) 98.3 F (36.8 C) (Oral)  Resp 18  Ht 6' (1.829 m)  Wt 232 lb (105.235 kg)  BMI 31.46 kg/m2  SpO2 95%  Physical Exam  Constitutional: He is oriented to person, place, and time. Vital signs are normal. He appears well-developed and well-nourished.  Non-toxic appearance. No distress.  Afebrile, non toxic, NAD  HENT:  Head: Normocephalic and atraumatic.  Mouth/Throat: Mucous membranes are normal.  Eyes: Conjunctivae and EOM are normal.  Neck: Normal range of motion. Neck supple.  Cardiovascular: Normal rate and intact distal pulses.   Distal pulses intact  Pulmonary/Chest: Effort normal. No respiratory distress.  Abdominal: Normal appearance. He exhibits no distension.  Musculoskeletal: Normal range of motion.       Left hand: He exhibits tenderness (over vesicles) and swelling (4th digit lateral nailfold). He exhibits normal range of motion and normal two-point discrimination. Normal sensation noted. Normal strength noted.       Hands: Left fourth digit with lateral nailfold erythema and swelling, with vesicles that appear erythematous and mildly purple, which is very TTP. Mild erythematous streaking down lateral fourth digit which does not cross DIP joint. No flexor tendon TTP. FROM intact at PIP, DIP and MCP joints. vesicular lesion to medial aspect of palm (see picture above), which  is also erythematous and TTP. No bony TTP or crepitus, strength and sensation intact.   Neurological: He is alert and oriented to person, place, and time. He has normal strength. No sensory deficit.  Skin: Skin is warm and dry. Rash noted. Rash is vesicular. There is erythema.  See MSK exam above  Psychiatric: He has a normal mood and affect. His behavior is normal.  Nursing note and vitals reviewed.   ED Course  Procedures (including critical care time)  DIAGNOSTIC STUDIES: Oxygen Saturation is 95% on RA, adequate by my interpretation.    COORDINATION OF CARE: 2:25 PM-Discussed pt with Dr. Darl Householder. He saw and evaluated pt. Advised valtrex, pain medication and gabapentin. Will give pt hand surgery referral and advised follow up. Pt is agreeable with plan.   Labs Review Labs Reviewed - No data to display  Imaging Review No results found.   EKG Interpretation None      MDM   Final diagnoses:  Herpetic  whitlow  Paronychia of finger, left  Shingles rash  History of HIV infection  History of diabetes mellitus, type II  Hand pain, left    53 y.o. male with hx of HIV and DM, here with L fingernail lateral edge vesicular rash, erythema and warmth, TTP, appears to be herpetic whitlow vs shingles, may be early paronychia. Vesicular lesion to medial palm as well, somewhat close to same distribution as digit. No drainage. Mild erythematous streak down L 4th digit but nothing past DIP joint. Discussed case with Dr Darl Householder given pt's immunocompromised state, Dr yaw saw pt and agreed to start valtrex, gabapentin, and pain medication. Started doxy for coverage for paronychia, given pt's immunocompromised status. Hand referral given. I&D not performed given risk with HSV/shingles spread. Discussed hand soaks. Strict return precautions given. I explained the diagnosis and have given explicit precautions to return to the ER including for any other new or worsening symptoms. The patient understands and  accepts the medical plan as it's been dictated and I have answered their questions. Discharge instructions concerning home care and prescriptions have been given. The patient is STABLE and is discharged to home in good condition.  BP 135/84 mmHg  Pulse 95  Temp(Src) 98.3 F (36.8 C) (Oral)  Resp 18  Ht 6' (1.829 m)  Wt 232 lb (105.235 kg)  BMI 31.46 kg/m2  SpO2 95%  Meds ordered this encounter  Medications  . HYDROcodone-acetaminophen (NORCO) 5-325 MG per tablet    Sig: Take 1 tablet by mouth every 6 (six) hours as needed for severe pain.    Dispense:  6 tablet    Refill:  0    Order Specific Question:  Supervising Provider    Answer:  Noemi Chapel D [[id number]]  . gabapentin (NEURONTIN) 100 MG capsule    Sig: 300 mg orally on day 1, 300 mg twice a day on day 2, and 300 mg 3 times a day on day 3, then 3 times daily thereafter    Dispense:  60 capsule    Refill:  0    Order Specific Question:  Supervising Provider    Answer:  Noemi Chapel D [[id number]]  . valACYclovir (VALTREX) 1000 MG tablet    Sig: Take 1 tablet (1,000 mg total) by mouth 3 (three) times daily. X 7 days    Dispense:  21 tablet    Refill:  0    Order Specific Question:  Supervising Provider    Answer:  Noemi Chapel D [[id number]]  . doxycycline (VIBRAMYCIN) 100 MG capsule    Sig: Take 1 capsule (100 mg total) by mouth 2 (two) times daily. One po bid x 7 days    Dispense:  14 capsule    Refill:  0    Order Specific Question:  Supervising Provider    Answer:  Noemi Chapel D [[id number]]     I personally performed the services described in this documentation, which was scribed in my presence. The recorded information has been reviewed and is accurate.  Patty Sermons [place], PA-C 12/18/14 [place], [place] 12/18/14 [place] Yao, MD 12/19/14 [phone]

## 2014-12-18 NOTE — ED Notes (Signed)
Pt with blister rash to left hand x 3 days

## 2015-02-12 ENCOUNTER — Telehealth: Payer: Self-pay | Admitting: *Deleted

## 2015-02-12 NOTE — Telephone Encounter (Signed)
Patient called requesting a referral to a cardiologist stating he sometimes "has a funny feeling in his chest". Advised patient if he experiences chest pain he needs to go to the ED and he agreed to this. He said Dr. Tommy Medal talked to him about this previously and he was not interested at that time. Please put a referral in [facility] and chose [facility] in department. Their office will contact patient to schedule.

## 2015-02-13 ENCOUNTER — Ambulatory Visit: Payer: Self-pay | Admitting: Endocrinology

## 2015-02-18 ENCOUNTER — Other Ambulatory Visit: Payer: Self-pay | Admitting: *Deleted

## 2015-02-18 DIAGNOSIS — R002 Palpitations: Secondary | ICD-10-CM

## 2015-02-18 DIAGNOSIS — G4700 Insomnia, unspecified: Secondary | ICD-10-CM

## 2015-02-18 DIAGNOSIS — B20 Human immunodeficiency virus [HIV] disease: Secondary | ICD-10-CM

## 2015-02-18 MED ORDER — ETRAVIRINE 200 MG PO TABS: 400.0000 mg | ORAL_TABLET | Freq: Every day | ORAL | Status: DC

## 2015-02-19 ENCOUNTER — Telehealth: Payer: Self-pay | Admitting: *Deleted

## 2015-02-19 ENCOUNTER — Other Ambulatory Visit: Payer: Self-pay | Admitting: Infectious Disease

## 2015-02-19 DIAGNOSIS — R0789 Other chest pain: Secondary | ICD-10-CM

## 2015-02-19 NOTE — Telephone Encounter (Signed)
Called the [employer] and was told all funds for HIV have been exhausted.  I called the Patient [place].

## 2015-02-22 ENCOUNTER — Other Ambulatory Visit: Payer: Self-pay | Admitting: Infectious Disease

## 2015-02-22 DIAGNOSIS — R0789 Other chest pain: Secondary | ICD-10-CM

## 2015-02-22 NOTE — Progress Notes (Signed)
I put the referral back in under [facility]. Please just sign off on it. I will notify patient.

## 2015-03-05 ENCOUNTER — Ambulatory Visit: Payer: Self-pay | Admitting: Endocrinology

## 2015-03-23 ENCOUNTER — Other Ambulatory Visit: Payer: Self-pay | Admitting: Infectious Disease

## 2015-03-28 NOTE — Progress Notes (Signed)
Patient ID: Ricardo [name], male   DOB: 01/24/1961, 54 y.o.   MRN: [id number]     Cardiology Office Note   Date:  03/28/2015   ID:  Ricardo [name], DOB 11/04/1961, MRN [id number]  PCP:  Cornelius Van Dam, MD  Cardiologist:   Peter Nishan, MD   No chief complaint on file.     History of Present Illness: Ricardo [name] is a 54 y.o. male who presents for evaluation of atypical chest pain  Sees ID for HIV  Good viral suppression and CD 4 counts over 700  CRF;s DM and elevated lipids  On Rx including statin  Last few months with pain in center of chest Not always exertional lasts minutes  Goes away spontaneously.  Some associated dyspnea Can't do what I use to.  No previous stress testing.  Some LE edema as well  Compliant with meds.  No recent trauma  Pain not pleuritic     Past Medical History  Diagnosis Date  . HIV (human immunodeficiency virus infection)   . Diabetes type 2, uncontrolled   . Hyperlipidemia   . Palpitations   . LVH (left ventricular hypertrophy)   . Diabetes mellitus without complication     No past surgical history on file.   Current Outpatient Prescriptions  Medication Sig Dispense Refill  . amitriptyline (ELAVIL) 25 MG tablet Take 1 tablet (25 mg total) by mouth at bedtime. 30 tablet 3  . aspirin 81 MG tablet Take 81 mg by mouth daily.      . atenolol (TENORMIN) 50 MG tablet TAKE ONE TABLET BY MOUTH ONCE DAILY 30 tablet 5  . Blood Glucose Monitoring Suppl (ONE TOUCH ULTRA 2) W/DEVICE KIT Use to check blood sugars 1/day. 1 each 0  . doxycycline (VIBRAMYCIN) 100 MG capsule Take 1 capsule (100 mg total) by mouth 2 (two) times daily. One po bid x 7 days 14 capsule 0  . Etravirine 200 MG TABS Take 2 tablets (400 mg total) by mouth daily. 60 tablet 5  . gabapentin (NEURONTIN) 100 MG capsule 300 mg orally on day 1, 300 mg twice a day on day 2, and 300 mg 3 times a day on day 3, then 3 times daily thereafter 60 capsule 0  . glucose blood (ONE TOUCH ULTRA  TEST) test strip Use to check blood sugars 1/day. 100 each 3  . HYDROcodone-acetaminophen (NORCO) 5-325 MG per tablet Take 1 tablet by mouth every 6 (six) hours as needed for severe pain. 6 tablet 0  . metFORMIN (GLUCOPHAGE) 1000 MG tablet Take 1 tablet (1,000 mg total) by mouth 2 (two) times daily with a meal. 60 tablet 11  . pioglitazone (ACTOS) 30 MG tablet Take 1 tablet (30 mg total) by mouth daily. 30 tablet 11  . pravastatin (PRAVACHOL) 20 MG tablet Take 1 tablet (20 mg total) by mouth daily. 30 tablet 11  . traMADol (ULTRAM) 50 MG tablet Take 1 tablet (50 mg total) by mouth at bedtime as needed. 30 tablet 1  . TRUVADA 200-300 MG per tablet TAKE 1 TABLET BY MOUTH EVERY DAY 30 tablet 0  . zolpidem (AMBIEN) 10 MG tablet Take 1 tablet (10 mg total) by mouth at bedtime as needed. (Patient not taking: Reported on 12/12/2014) 30 tablet 4  . zolpidem (AMBIEN) 10 MG tablet Take 1 tablet (10 mg total) by mouth at bedtime as needed for sleep. 30 tablet 4   No current facility-administered medications for this visit.    Allergies:     Amoxicillin-pot clavulanate    Social History:  The patient  reports that he has never smoked. He has never used smokeless tobacco. He reports that he does not drink alcohol or use illicit drugs.   Family History:  The patient's family history includes Coronary artery disease (age of onset: 32) in his father.    ROS:  Please see the history of present illness.   Otherwise, review of systems are positive for none.   All other systems are reviewed and negative.    PHYSICAL EXAM: VS:  There were no vitals taken for this visit. , BMI There is no weight on file to calculate BMI. GEN: Well nourished, well developed, in no acute distress HEENT: normal Neck: no JVD, carotid bruits, or masses Cardiac:  RRR; no murmurs, rubs, or gallops,no edema  Respiratory:  clear to auscultation bilaterally, normal work of breathing GI: soft, nontender, nondistended, + BS MS: no  deformity or atrophy Skin: warm and dry, no rash Neuro:  Strength and sensation are intact Psych: euthymic mood, full affect   EKG:   10/15/11  SR rate 72 nonspecific ST/T wave changes  03/29/15  SR rate 70  Normal    Recent Labs: 12/12/2014: ALT 102*; BUN 13; Creatinine 1.04; Hemoglobin 15.7; Platelets 205; Potassium 4.5; Sodium 138    Lipid Panel    Component Value Date/Time   CHOL 223* 12/12/2014 0940   TRIG 298* 12/12/2014 0940   HDL 29* 12/12/2014 0940   CHOLHDL 7.7 12/12/2014 0940   VLDL 60* 12/12/2014 0940   LDLCALC 134* 12/12/2014 0940   LDLDIRECT 50 12/12/2013 1152      Wt Readings from Last 3 Encounters:  12/18/14 232 lb (105.235 kg)  12/12/14 232 lb (105.235 kg)  08/15/14 249 lb (112.946 kg)      Other studies Reviewed: Additional studies/ records that were reviewed today include:  Epic ID notes.    ASSESSMENT AND PLAN:  1.  Chest pain: Atypical normal ECG f/u stress echo given associated dyspnea 2. HIV  F/U ID  Continue antiretrovirals  CD 4 count excellent 3. Chol:  Continue statin    Current medicines are reviewed at length with the patient today.  The patient does not have concerns regarding medicines.  The following changes have been made:  no change  Labs/ tests ordered today include: Stress Echo  No orders of the defined types were placed in this encounter.     Disposition:   FU with me PRN if stress echo normal  i    Signed, Peter Nishan, MD  03/28/2015 12:07 PM    [facility] [address], [place], [place]  [address] Phone: [phone]; Fax: [phone]    

## 2015-03-29 ENCOUNTER — Ambulatory Visit (INDEPENDENT_AMBULATORY_CARE_PROVIDER_SITE_OTHER): Payer: 59 | Admitting: Cardiovascular Disease

## 2015-03-29 ENCOUNTER — Encounter: Payer: Self-pay | Admitting: Cardiovascular Disease

## 2015-03-29 VITALS — BP 120/82 | HR 70 | Ht 72.0 in | Wt 253.4 lb

## 2015-03-29 DIAGNOSIS — R0789 Other chest pain: Secondary | ICD-10-CM | POA: Diagnosis not present

## 2015-03-29 NOTE — Patient Instructions (Signed)
Medication Instructions:  NO CHANGES  Labwork: NONE  Testing/Procedures: Your physician has requested that you have a stress echocardiogram. For further information please visit [email or web address]. Please follow instruction sheet as given.   Follow-Up: AS NEEDED  Any Other Special Instructions Will Be Listed Below (If Applicable).

## 2015-04-05 ENCOUNTER — Other Ambulatory Visit (HOSPITAL_COMMUNITY): Payer: 59

## 2015-04-26 ENCOUNTER — Other Ambulatory Visit: Payer: Self-pay | Admitting: Infectious Disease

## 2015-04-27 ENCOUNTER — Other Ambulatory Visit: Payer: Self-pay | Admitting: Infectious Disease

## 2015-04-29 ENCOUNTER — Telehealth (HOSPITAL_COMMUNITY): Payer: Self-pay

## 2015-04-29 NOTE — Telephone Encounter (Signed)
Left message on voicemail in reference to upcoming appointment scheduled for 04-30-2015.Phone number given for [name] call back so details instructions can be given. Ricardo [name], Ricardo [name]

## 2015-04-29 NOTE — Telephone Encounter (Signed)
Patient given detailed instructions per Stress Test Requisiton for test on 04-30-2015 at 7:30am..m Patient verbalized understanding. Ricardo [name], [name] A

## 2015-04-30 ENCOUNTER — Ambulatory Visit (HOSPITAL_COMMUNITY): Payer: 59 | Attending: Cardiovascular Disease

## 2015-04-30 ENCOUNTER — Other Ambulatory Visit (HOSPITAL_COMMUNITY): Payer: 59

## 2015-04-30 ENCOUNTER — Ambulatory Visit (HOSPITAL_COMMUNITY): Payer: 59

## 2015-04-30 DIAGNOSIS — R0602 Shortness of breath: Secondary | ICD-10-CM | POA: Insufficient documentation

## 2015-04-30 DIAGNOSIS — R079 Chest pain, unspecified: Secondary | ICD-10-CM | POA: Insufficient documentation

## 2015-04-30 DIAGNOSIS — R0789 Other chest pain: Secondary | ICD-10-CM

## 2015-04-30 LAB — ECHOCARDIOGRAM STRESS TEST
CHL CUP STRESS STAGE 1 SBP: 121 mmHg
CHL CUP STRESS STAGE 1 SPEED: 0 mph
CHL CUP STRESS STAGE 2 GRADE: 0 %
CHL CUP STRESS STAGE 2 HR: 93 {beats}/min
CHL CUP STRESS STAGE 2 SBP: 123 mmHg
CHL CUP STRESS STAGE 3 GRADE: 0 %
CHL CUP STRESS STAGE 3 SPEED: 0 mph
CHL CUP STRESS STAGE 5 DBP: 65 mmHg
CHL CUP STRESS STAGE 5 GRADE: 12 %
CHL CUP STRESS STAGE 5 SBP: 160 mmHg
CHL CUP STRESS STAGE 5 SPEED: 2.5 mph
CHL CUP STRESS STAGE 7 DBP: 82 mmHg
CHL CUP STRESS STAGE 7 SBP: 170 mmHg
CHL CUP STRESS STAGE 7 SPEED: 0 mph
CHL CUP STRESS STAGE 8 DBP: 65 mmHg
CHL CUP STRESS STAGE 8 SPEED: 0 mph
CSEPPMHR: 73 %
Estimated workload: 3.1 METS
Peak HR: 122 {beats}/min
Stage 1 DBP: 71 mmHg
Stage 1 Grade: 0 %
Stage 1 HR: 91 {beats}/min
Stage 2 DBP: 67 mmHg
Stage 2 Speed: 0 mph
Stage 3 HR: 91 {beats}/min
Stage 4 Grade: 10 %
Stage 4 HR: 130 {beats}/min
Stage 4 Speed: 1.7 mph
Stage 5 HR: 141 {beats}/min
Stage 6 Grade: 14 %
Stage 6 HR: 122 {beats}/min
Stage 6 Speed: 0 mph
Stage 7 Grade: 0 %
Stage 7 HR: 112 {beats}/min
Stage 8 Grade: 0 %
Stage 8 HR: 106 {beats}/min
Stage 8 SBP: 140 mmHg

## 2015-05-24 ENCOUNTER — Telehealth: Payer: Self-pay | Admitting: *Deleted

## 2015-05-24 DIAGNOSIS — B20 Human immunodeficiency virus [HIV] disease: Secondary | ICD-10-CM

## 2015-05-24 MED ORDER — EMTRICITABINE-TENOFOVIR DF 200-300 MG PO TABS: 1.0000 | ORAL_TABLET | Freq: Every day | ORAL | Status: DC

## 2015-05-24 NOTE — Telephone Encounter (Signed)
Needs refill for Truvada.

## 2015-06-01 ENCOUNTER — Other Ambulatory Visit: Payer: Self-pay | Admitting: Infectious Disease

## 2015-06-17 ENCOUNTER — Other Ambulatory Visit: Payer: Self-pay

## 2015-07-07 ENCOUNTER — Other Ambulatory Visit: Payer: Self-pay | Admitting: Infectious Disease

## 2015-07-07 DIAGNOSIS — G4700 Insomnia, unspecified: Secondary | ICD-10-CM

## 2015-07-17 ENCOUNTER — Other Ambulatory Visit: Payer: Self-pay | Admitting: Licensed Clinical Social Worker

## 2015-07-17 DIAGNOSIS — B20 Human immunodeficiency virus [HIV] disease: Secondary | ICD-10-CM

## 2015-07-17 MED ORDER — EMTRICITABINE-TENOFOVIR DF 200-300 MG PO TABS: 1.0000 | ORAL_TABLET | Freq: Every day | ORAL | Status: DC

## 2015-07-17 MED ORDER — ETRAVIRINE 200 MG PO TABS: 400.0000 mg | ORAL_TABLET | Freq: Every day | ORAL | Status: DC

## 2015-07-24 ENCOUNTER — Other Ambulatory Visit: Payer: 59

## 2015-07-24 ENCOUNTER — Other Ambulatory Visit: Payer: Self-pay | Admitting: Infectious Disease

## 2015-07-24 NOTE — Telephone Encounter (Signed)
Is it ok to refill the patient Ricardo [name]?

## 2015-07-25 ENCOUNTER — Other Ambulatory Visit: Payer: 59

## 2015-07-25 DIAGNOSIS — G629 Polyneuropathy, unspecified: Secondary | ICD-10-CM

## 2015-07-25 DIAGNOSIS — G4700 Insomnia, unspecified: Secondary | ICD-10-CM

## 2015-07-25 DIAGNOSIS — B20 Human immunodeficiency virus [HIV] disease: Secondary | ICD-10-CM

## 2015-07-25 DIAGNOSIS — Z23 Encounter for immunization: Secondary | ICD-10-CM

## 2015-07-25 DIAGNOSIS — E781 Pure hyperglyceridemia: Secondary | ICD-10-CM

## 2015-07-25 DIAGNOSIS — R002 Palpitations: Secondary | ICD-10-CM

## 2015-07-25 DIAGNOSIS — I517 Cardiomegaly: Secondary | ICD-10-CM

## 2015-07-25 DIAGNOSIS — E119 Type 2 diabetes mellitus without complications: Secondary | ICD-10-CM

## 2015-07-25 LAB — COMPLETE METABOLIC PANEL WITH GFR
ALBUMIN: 3.8 g/dL (ref 3.6–5.1)
ALK PHOS: 90 U/L (ref 40–115)
ALT: 126 U/L — ABNORMAL HIGH (ref 9–46)
AST: 120 U/L — ABNORMAL HIGH (ref 10–35)
BILIRUBIN TOTAL: 0.5 mg/dL (ref 0.2–1.2)
BUN: 15 mg/dL (ref 7–25)
CO2: 24 mmol/L (ref 20–31)
CREATININE: 0.83 mg/dL (ref 0.70–1.33)
Calcium: 9.2 mg/dL (ref 8.6–10.3)
Chloride: 104 mmol/L (ref 98–110)
GFR, Est African American: >89 mL/min (ref >=60)
GFR, Est Non African American: >89 mL/min (ref >=60)
Glucose, Bld: 197 mg/dL — ABNORMAL HIGH (ref 65–99)
POTASSIUM: 4 mmol/L (ref 3.5–5.3)
Sodium: 136 mmol/L (ref 135–146)
TOTAL PROTEIN: 6.8 g/dL (ref 6.1–8.1)

## 2015-07-25 LAB — CBC WITH DIFFERENTIAL/PLATELET
BASOS ABS: 0.1 10*3/uL (ref 0.0–0.1)
BASOS PCT: 1 % (ref 0–1)
EOS ABS: 0.2 10*3/uL (ref 0.0–0.7)
Eosinophils Relative: 3 % (ref 0–5)
HEMATOCRIT: 44.1 % (ref 39.0–52.0)
Hemoglobin: 14.8 g/dL (ref 13.0–17.0)
Lymphocytes Relative: 44 % (ref 12–46)
Lymphs Abs: 2.3 10*3/uL (ref 0.7–4.0)
MCH: 31.8 pg (ref 26.0–34.0)
MCHC: 33.6 g/dL (ref 30.0–36.0)
MCV: 94.6 fL (ref 78.0–100.0)
MONOS PCT: 6 % (ref 3–12)
MPV: 10.7 fL (ref 8.6–12.4)
Monocytes Absolute: 0.3 10*3/uL (ref 0.1–1.0)
NEUTROS ABS: 2.4 10*3/uL (ref 1.7–7.7)
Neutrophils Relative %: 46 % (ref 43–77)
Platelets: 222 10*3/uL (ref 150–400)
RBC: 4.66 MIL/uL (ref 4.22–5.81)
RDW: 13.7 % (ref 11.5–15.5)
WBC: 5.2 10*3/uL (ref 4.0–10.5)

## 2015-07-26 LAB — RPR

## 2015-07-26 LAB — HIV-1 RNA QUANT-NO REFLEX-BLD: HIV-1 RNA Quant, Log: <1.3 {Log} (ref <1.30)

## 2015-07-26 LAB — T-HELPER CELL (CD4) - (RCID CLINIC ONLY)
CD4 % Helper T Cell: 29 % — ABNORMAL LOW (ref 33–55)
CD4 T CELL ABS: 690 /uL (ref 400–2700)

## 2015-07-29 ENCOUNTER — Other Ambulatory Visit: Payer: Self-pay | Admitting: Infectious Disease

## 2015-07-30 ENCOUNTER — Telehealth: Payer: Self-pay | Admitting: Infectious Disease

## 2015-07-30 NOTE — Telephone Encounter (Signed)
xf

## 2015-07-31 ENCOUNTER — Ambulatory Visit: Payer: 59 | Admitting: Infectious Disease

## 2015-07-31 ENCOUNTER — Other Ambulatory Visit: Payer: 59

## 2015-08-14 ENCOUNTER — Ambulatory Visit: Payer: 59 | Admitting: Infectious Disease

## 2015-09-09 ENCOUNTER — Telehealth: Payer: Self-pay | Admitting: *Deleted

## 2015-09-09 NOTE — Telephone Encounter (Signed)
1 gram po bid x 7 days is fine

## 2015-09-09 NOTE — Telephone Encounter (Signed)
Patient called requesting an Rx for Valtrex stating he has used it in the past and he feels he is having the start of a "breakout". No longer on his med list, please advise and provide dosing instructions if ok. Myrtis Hopping

## 2015-09-10 ENCOUNTER — Other Ambulatory Visit: Payer: Self-pay | Admitting: *Deleted

## 2015-09-10 DIAGNOSIS — Z21 Asymptomatic human immunodeficiency virus [HIV] infection status: Secondary | ICD-10-CM

## 2015-09-10 MED ORDER — VALACYCLOVIR HCL 1 G PO TABS: 1000.0000 mg | ORAL_TABLET | Freq: Two times a day (BID) | ORAL | Status: DC

## 2015-09-10 NOTE — Telephone Encounter (Signed)
Rx sent to [facility] in [place] and patient notified. Myrtis Hopping

## 2015-10-21 ENCOUNTER — Other Ambulatory Visit: Payer: Self-pay | Admitting: Infectious Disease

## 2015-10-21 DIAGNOSIS — B20 Human immunodeficiency virus [HIV] disease: Secondary | ICD-10-CM

## 2015-10-29 ENCOUNTER — Other Ambulatory Visit: Payer: Self-pay | Admitting: Infectious Disease

## 2015-11-20 ENCOUNTER — Encounter: Payer: Self-pay | Admitting: Infectious Disease

## 2015-11-20 ENCOUNTER — Ambulatory Visit (INDEPENDENT_AMBULATORY_CARE_PROVIDER_SITE_OTHER): Payer: 59 | Admitting: Infectious Disease

## 2015-11-20 ENCOUNTER — Telehealth: Payer: Self-pay | Admitting: *Deleted

## 2015-11-20 VITALS — BP 136/83 | HR 82 | Temp 97.2°F | Wt 256.0 lb

## 2015-11-20 DIAGNOSIS — E781 Pure hyperglyceridemia: Secondary | ICD-10-CM

## 2015-11-20 DIAGNOSIS — E1169 Type 2 diabetes mellitus with other specified complication: Secondary | ICD-10-CM

## 2015-11-20 DIAGNOSIS — Z23 Encounter for immunization: Secondary | ICD-10-CM | POA: Diagnosis not present

## 2015-11-20 DIAGNOSIS — E669 Obesity, unspecified: Secondary | ICD-10-CM

## 2015-11-20 DIAGNOSIS — B20 Human immunodeficiency virus [HIV] disease: Secondary | ICD-10-CM

## 2015-11-20 DIAGNOSIS — I517 Cardiomegaly: Secondary | ICD-10-CM

## 2015-11-20 DIAGNOSIS — G4700 Insomnia, unspecified: Secondary | ICD-10-CM | POA: Diagnosis not present

## 2015-11-20 DIAGNOSIS — Z21 Asymptomatic human immunodeficiency virus [HIV] infection status: Secondary | ICD-10-CM

## 2015-11-20 DIAGNOSIS — E119 Type 2 diabetes mellitus without complications: Secondary | ICD-10-CM

## 2015-11-20 HISTORY — DX: Type 2 diabetes mellitus with other specified complication: E11.69

## 2015-11-20 HISTORY — DX: Type 2 diabetes mellitus with other specified complication: E66.9

## 2015-11-20 LAB — COMPLETE METABOLIC PANEL WITH GFR
ALBUMIN: 3.8 g/dL (ref 3.6–5.1)
ALK PHOS: 97 U/L (ref 40–115)
ALT: 133 U/L — ABNORMAL HIGH (ref 9–46)
AST: 109 U/L — ABNORMAL HIGH (ref 10–35)
BILIRUBIN TOTAL: 0.5 mg/dL (ref 0.2–1.2)
BUN: 16 mg/dL (ref 7–25)
CO2: 25 mmol/L (ref 20–31)
Calcium: 9.1 mg/dL (ref 8.6–10.3)
Chloride: 100 mmol/L (ref 98–110)
Creat: 1.01 mg/dL (ref 0.70–1.33)
GFR, EST NON AFRICAN AMERICAN: 84 mL/min (ref >=60)
GLUCOSE: 294 mg/dL — AB (ref 65–99)
POTASSIUM: 4.2 mmol/L (ref 3.5–5.3)
SODIUM: 134 mmol/L — AB (ref 135–146)
Total Protein: 7.4 g/dL (ref 6.1–8.1)

## 2015-11-20 LAB — CBC WITH DIFFERENTIAL/PLATELET
Basophils Absolute: 0.1 10*3/uL (ref 0.0–0.1)
Basophils Relative: 1 % (ref 0–1)
Eosinophils Absolute: 0.1 10*3/uL (ref 0.0–0.7)
Eosinophils Relative: 2 % (ref 0–5)
HEMATOCRIT: 43.4 % (ref 39.0–52.0)
HEMOGLOBIN: 15 g/dL (ref 13.0–17.0)
Lymphocytes Relative: 48 % — ABNORMAL HIGH (ref 12–46)
Lymphs Abs: 2.9 10*3/uL (ref 0.7–4.0)
MCH: 32.7 pg (ref 26.0–34.0)
MCHC: 34.6 g/dL (ref 30.0–36.0)
MCV: 94.6 fL (ref 78.0–100.0)
MONO ABS: 0.5 10*3/uL (ref 0.1–1.0)
MONOS PCT: 8 % (ref 3–12)
MPV: 10.3 fL (ref 8.6–12.4)
NEUTROS ABS: 2.5 10*3/uL (ref 1.7–7.7)
NEUTROS PCT: 41 % — AB (ref 43–77)
Platelets: 203 10*3/uL (ref 150–400)
RBC: 4.59 MIL/uL (ref 4.22–5.81)
RDW: 13.6 % (ref 11.5–15.5)
WBC: 6 10*3/uL (ref 4.0–10.5)

## 2015-11-20 LAB — HEMOGLOBIN A1C
HEMOGLOBIN A1C: 8.9 % — AB (ref <5.7)
Mean Plasma Glucose: 209 mg/dL — ABNORMAL HIGH (ref <117)

## 2015-11-20 LAB — LDL CHOLESTEROL, DIRECT: Direct LDL: 82 mg/dL (ref <130)

## 2015-11-20 MED ORDER — ETRAVIRINE 200 MG PO TABS: 400.0000 mg | ORAL_TABLET | Freq: Every day | ORAL | Status: DC

## 2015-11-20 MED ORDER — EMTRICITABINE-TENOFOVIR DF 200-300 MG PO TABS: 1.0000 | ORAL_TABLET | Freq: Every day | ORAL | Status: DC

## 2015-11-20 MED ORDER — AMITRIPTYLINE HCL 25 MG PO TABS: 25.0000 mg | ORAL_TABLET | Freq: Every day | ORAL | Status: DC

## 2015-11-20 MED ORDER — ATENOLOL 50 MG PO TABS: ORAL_TABLET | ORAL | Status: DC

## 2015-11-20 MED ORDER — TRIAMCINOLONE ACETONIDE 0.5 % EX OINT: 1.0000 "application " | TOPICAL_OINTMENT | Freq: Two times a day (BID) | CUTANEOUS | Status: DC

## 2015-11-20 MED ORDER — PIOGLITAZONE HCL 30 MG PO TABS: 30.0000 mg | ORAL_TABLET | Freq: Every day | ORAL | Status: DC

## 2015-11-20 NOTE — Telephone Encounter (Signed)
HIV medications inadvertently sent to Ricardo [name] by mistake.  Pt does not want his HIV medications sent to [facility].

## 2015-11-20 NOTE — Progress Notes (Signed)
Chief complaint: followup for HIV on meds, DM, hyperlipidemia, insomnia Subjective:    Patient ID: Ricardo [name], male    DOB: [birth year], 54 y.o.   MRN: [id number]  HPI   54 yo man with HIV who had been on  reyataz, norvir and truvada with perfect virological suppression and whom we changed to Tivicay and Truvada in December 2013.   On recheck of his viral load is undetectable CD4 count was quite high above 700.   He had at one point also received recent blood work in December a year ago w which showed history) only be 2538. When he was called about the elevated triglycerides he began to wonder if it was due to his new medications. Note his triglycerides have been 463 in May of 2014 he was RE on TIVICAY and Truvada. He also began to monitor his blood sugar now and noticed blood sugars are running anywhere between 180-2 200 or 400.   NOTE HE HAD STOPPED METFORMIN AND GLIPIZIDE DUE TO WEIGHT LOSS AT THAT TIME AND A1C WAS AT 6.  In the interim he began reading about TIVICAY and also the tricyclic  antidepressant that would place him on amitriptyline and found mention of elevated blood sugars with both of these drugs. There is mention of other blood sugars and 5% of individuals taking TIVICAY also mention of this with amitriptyline.  We then reviewed various other antiretroviral regimens to change him to any and opted for400 milligrams of Intelence once daily with Truvada.   I suspected on closer review of his chart that his blood sugars really or higher due to a combination of his weight gain his stopping metformin and his stopping glipizide.  He got back on metformin and was being seen by Dr. Loanne Drilling who started pioglitazone. Pt incidentally HAd NEVER GONE BACK TO BID METFORMIN ANd was only on 1g daily when he saw Dr. Loanne Drilling.  He returns for followup without complaints  He now is NO longer taking metformin at all. He is also out of his tricyclic for insomnia.  He is completely against  ever going on insulin because it would  Mean loss of his job.  Past Medical History  Diagnosis Date  . HIV (human immunodeficiency virus infection) ([place])   . Diabetes type 2, uncontrolled ([place])   . Hyperlipidemia   . Palpitations   . LVH (left ventricular hypertrophy)   . Diabetes mellitus without complication ([place])     No past surgical history on file.  Family History  Problem Relation Age of Onset  . Coronary artery disease Father 78      Social History   Social History  . Marital Status: Married    Spouse Name: N/A  . Number of Children: N/A  . Years of Education: N/A   Social History Main Topics  . Smoking status: Never Smoker   . Smokeless tobacco: Never Used  . Alcohol Use: No  . Drug Use: No  . Sexual Activity:    Partners: Female     Comment: pt. declined condoms   Other Topics Concern  . None   Social History Narrative    Allergies  Allergen Reactions  . Amoxicillin-Pot Clavulanate      Current outpatient prescriptions:  .  aspirin 81 MG tablet, Take 81 mg by mouth daily.  , Disp: , Rfl:  .  atenolol (TENORMIN) 50 MG tablet, TAKE ONE TABLET BY MOUTH ONCE DAILY, Disp: 30 tablet, Rfl: 11 .  Blood Glucose Monitoring Suppl (  ONE TOUCH ULTRA 2) W/DEVICE KIT, Use to check blood sugars 1/day., Disp: 1 each, Rfl: 0 .  glucose blood (ONE TOUCH ULTRA TEST) test strip, Use to check blood sugars 1/day., Disp: 100 each, Rfl: 3 .  pioglitazone (ACTOS) 30 MG tablet, Take 1 tablet (30 mg total) by mouth daily., Disp: 30 tablet, Rfl: 11 .  pravastatin (PRAVACHOL) 20 MG tablet, TAKE ONE TABLET BY MOUTH ONCE DAILY, Disp: 30 tablet, Rfl: 0 .  amitriptyline (ELAVIL) 25 MG tablet, Take 1 tablet (25 mg total) by mouth at bedtime., Disp: 30 tablet, Rfl: 11 .  emtricitabine-tenofovir (TRUVADA) 200-300 MG tablet, Take 1 tablet by mouth daily., Disp: 30 tablet, Rfl: 11 .  Etravirine 200 MG TABS, Take 2 tablets (400 mg total) by mouth daily., Disp: 60 tablet, Rfl: 11 .   triamcinolone ointment (KENALOG) 0.5 %, Apply 1 application topically 2 (two) times daily., Disp: 30 g, Rfl: 4     Review of Systems  Constitutional: Negative for fever, chills, diaphoresis, activity change, appetite change, fatigue and unexpected weight change.  HENT: Negative for congestion, rhinorrhea, sinus pressure, sneezing, sore throat and trouble swallowing.   Eyes: Negative for photophobia and visual disturbance.  Respiratory: Negative for cough, chest tightness, shortness of breath, wheezing and stridor.   Cardiovascular: Negative for chest pain, palpitations and leg swelling.  Gastrointestinal: Negative for nausea, vomiting, abdominal pain, diarrhea, constipation, blood in stool, abdominal distention and anal bleeding.  Genitourinary: Negative for dysuria, hematuria, flank pain and difficulty urinating.  Musculoskeletal: Negative for myalgias, back pain, joint swelling, arthralgias and gait problem.  Skin: Negative for color change, pallor, rash and wound.  Neurological: Negative for dizziness, tremors, weakness and light-headedness.  Hematological: Negative for adenopathy. Does not bruise/bleed easily.  Psychiatric/Behavioral: Negative for behavioral problems, confusion, sleep disturbance, dysphoric mood, decreased concentration and agitation.       Objective:   Physical Exam  Constitutional: He is oriented to person, place, and time. He appears well-developed and well-nourished.  HENT:  Head: Normocephalic and atraumatic.  Eyes: Conjunctivae and EOM are normal.  Neck: Normal range of motion. Neck supple.  Cardiovascular: Normal rate and regular rhythm.   Pulmonary/Chest: Effort normal. No respiratory distress. He has no wheezes.  Abdominal: Soft. He exhibits no distension.  Musculoskeletal: Normal range of motion. He exhibits no edema or tenderness.  Neurological: He is alert and oriented to person, place, and time.  Skin: Skin is warm and dry. No rash noted. No  erythema. No pallor.  Psychiatric: He has a normal mood and affect. His behavior is normal. Judgment and thought content normal.          Assessment & Plan:  HIV: continue  Intelence and Truvada, recheck labs today  DM: check A1c. I am skeptical we will be able to get him to followup with PCP or endocrinologist. If his BG are out of control again he will need to restart metformin and or see endocrinologist.   Hyperlipidemia  Recheck  Dire LDL continue statin  Insomnia: continue trycyclic  LVH: continue beta blocker Filed Vitals:   11/20/15 1012  BP: 136/83  Pulse: 82  Temp: 97.2 F (36.2 C)   Obesity: encouraged weight loss through diet and exercise  I spent greater than 25 minutes with the patient including greater than 50% of time in face to face counsel of the patient's HIV, DM 2, hyperlipidemia, insomnia, LVH and in coordination of his care.

## 2015-11-20 NOTE — Telephone Encounter (Signed)
Well I am sorry that was a mistake. We do not see Mr Ricardo [name] very frequently and so taking care fo mx refills that were not made because he has not been seen for a while i spart of the problem

## 2015-11-20 NOTE — Patient Instructions (Signed)
We will get blood work today  I am sending rx for steroid cream for your rash  I have refilled rest of meds  Keep up the good work   RTC in 6 months

## 2015-11-21 ENCOUNTER — Telehealth: Payer: Self-pay | Admitting: *Deleted

## 2015-11-21 DIAGNOSIS — E118 Type 2 diabetes mellitus with unspecified complications: Secondary | ICD-10-CM

## 2015-11-21 LAB — HIV-1 RNA QUANT-NO REFLEX-BLD
HIV 1 RNA Quant: 22 copies/mL — ABNORMAL HIGH (ref <20)
HIV-1 RNA QUANT, LOG: 1.34 {Log_copies}/mL — AB (ref <1.30)

## 2015-11-21 LAB — RPR

## 2015-11-21 LAB — T-HELPER CELL (CD4) - (RCID CLINIC ONLY)
CD4 % Helper T Cell: 28 % — ABNORMAL LOW (ref 33–55)
CD4 T CELL ABS: 780 /uL (ref 400–2700)

## 2015-11-21 MED ORDER — METFORMIN HCL 500 MG PO TABS: 500.0000 mg | ORAL_TABLET | Freq: Two times a day (BID) | ORAL | Status: DC

## 2015-11-21 NOTE — Telephone Encounter (Signed)
He should try to push metformin from 500mg  bid to 1g bid. He should come back in and be seen in the interm. Cassie can you make sure his regimen of oral drugs for DM is OK?

## 2015-11-21 NOTE — Telephone Encounter (Signed)
-----   Message from Truman Hayward, MD sent at 11/21/2015  3:43 PM EST ----- A1c is too high. He either needs to go back on metformin starting at 500mg  bid, OR he needs to see an endocrinologist to see if there are any other options. He doesn't want insulin but he will be headed for insulin if this goes on. He saw dr. Loanne Drilling before

## 2015-11-21 NOTE — Telephone Encounter (Signed)
Patient will restart metformin per Dr. Lucianne Lei Dam's advice.  RX sent to local [place]. He will take immodium to help with the side effects.  He will restart his actos as well.  He declined a referral to endocrinology at this time, stating he "would rather try Dr. Lucianne Lei Dam's route first." ** Patient requests that any B20 medications be sent to the [facility] to preserve anonymity.  Landis Gandy, RN

## 2015-11-27 ENCOUNTER — Other Ambulatory Visit: Payer: Self-pay | Admitting: *Deleted

## 2015-11-27 DIAGNOSIS — E118 Type 2 diabetes mellitus with unspecified complications: Secondary | ICD-10-CM

## 2015-11-27 MED ORDER — METFORMIN HCL 500 MG PO TABS: 1000.0000 mg | ORAL_TABLET | Freq: Two times a day (BID) | ORAL | Status: DC

## 2015-11-27 MED ORDER — TRIAMCINOLONE ACETONIDE 0.5 % EX OINT: 1.0000 "application " | TOPICAL_OINTMENT | Freq: Two times a day (BID) | CUTANEOUS | Status: DC

## 2015-11-27 NOTE — Telephone Encounter (Signed)
He may want to start at 500mg  twice daily and after 2-4 weeks THEN push the dose to 1 g bid. He may not tolerate going directly to 1g bid

## 2015-11-27 NOTE — Telephone Encounter (Signed)
Patient will try 1000mg  twice daily with food.  [facility] lost the Rx. RN resent it, along with the kenalog cream.

## 2016-01-01 ENCOUNTER — Other Ambulatory Visit: Payer: Self-pay | Admitting: Infectious Disease

## 2016-01-13 ENCOUNTER — Other Ambulatory Visit: Payer: Self-pay | Admitting: Infectious Diseases

## 2016-01-13 DIAGNOSIS — E785 Hyperlipidemia, unspecified: Secondary | ICD-10-CM

## 2016-01-17 MED FILL — TRUVADA 200-300 MG TABS: 200-300 | 30 days supply | Qty: 30 | Fill #2

## 2016-01-17 MED FILL — INTELENCE 200 MG TABLET: 200 | 30 days supply | Qty: 60 | Fill #2

## 2016-02-18 ENCOUNTER — Other Ambulatory Visit: Payer: Self-pay | Admitting: Pharmacist Clinician (PhC)/ Clinical Pharmacy Specialist

## 2016-02-18 MED ORDER — EMTRICITABINE-TENOFOVIR DF 200-300 MG PO TABS: 1.0000 | ORAL_TABLET | Freq: Every day | ORAL | Status: DC

## 2016-02-18 MED ORDER — ETRAVIRINE 200 MG PO TABS: 400.0000 mg | ORAL_TABLET | Freq: Every day | ORAL | Status: DC

## 2016-02-18 NOTE — Progress Notes (Signed)
Apparently Ricardo [name] recently got on workman's comp for some reason. He was on ETR/TRV. Since he is on [employer], he has temporary lost insurance. We are going to attempt [facility] for the next couple of weeks until he gets back his insurance.

## 2016-02-18 NOTE — Progress Notes (Signed)
Thanks Minh! 

## 2016-02-28 ENCOUNTER — Telehealth: Payer: Self-pay | Admitting: Pharmacy Technician

## 2016-02-28 NOTE — Telephone Encounter (Signed)
Mr. [name] has lost his insurance for a month, maybe two.  I have helped him apply for HIV med assistance through [employer].  I asked if he could come to the clinic to sign the application and he asked me to mail him the application and he will sign and mail back,  I did.     On 02/25/16 he called to let me know it is in the mail and I should receive it 02/25/16.  It is 02/28/16 and I still do not have it.  He also asked if [facility] is quick about mailing the meds.  I let him know that it take a couple of days because everything goes through the mail to be delivered.

## 2016-03-02 ENCOUNTER — Telehealth: Payer: Self-pay | Admitting: Pharmacy Technician

## 2016-03-02 NOTE — Telephone Encounter (Signed)
I called to inform [name] that [employer] approved him to receive his two HIV medications while his insurance is inactive.  He should receive them Wednesday of this week.  I also let him know to call them for refills when he has 1 week left of the medications.  He agreed and was appreciative.

## 2016-05-20 ENCOUNTER — Ambulatory Visit: Payer: 59 | Admitting: Infectious Disease

## 2016-06-03 ENCOUNTER — Other Ambulatory Visit: Payer: Self-pay | Admitting: Pharmacist Clinician (PhC)/ Clinical Pharmacy Specialist

## 2016-06-03 MED ORDER — EMTRICITABINE-TENOFOVIR DF 200-300 MG PO TABS: 1.0000 | ORAL_TABLET | Freq: Every day | ORAL | Status: DC

## 2016-06-03 MED ORDER — ETRAVIRINE 200 MG PO TABS: 400.0000 mg | ORAL_TABLET | Freq: Every day | ORAL | Status: DC

## 2016-06-03 NOTE — Progress Notes (Signed)
Ricardo [name] called for more refills of his ETR/TRV. He started a new job but won't be able to get coverage until sept. We'll try for [employer] again.

## 2016-06-03 NOTE — Progress Notes (Signed)
That is fine. I wish he would be more on the ball as far as ADAP as well

## 2016-08-25 ENCOUNTER — Ambulatory Visit: Payer: 59 | Admitting: Infectious Disease

## 2016-08-25 ENCOUNTER — Other Ambulatory Visit: Payer: 59

## 2016-08-25 ENCOUNTER — Other Ambulatory Visit (HOSPITAL_COMMUNITY)
Admission: RE | Admit: 2016-08-25 | Discharge: 2016-08-25 | Disposition: A | Discharge status: Home / Self Care | Payer: 59 | Source: Ambulatory Visit | Attending: Infectious Disease | Admitting: Infectious Disease

## 2016-08-25 DIAGNOSIS — I517 Cardiomegaly: Secondary | ICD-10-CM

## 2016-08-25 DIAGNOSIS — G4700 Insomnia, unspecified: Secondary | ICD-10-CM

## 2016-08-25 DIAGNOSIS — Z23 Encounter for immunization: Secondary | ICD-10-CM

## 2016-08-25 DIAGNOSIS — G629 Polyneuropathy, unspecified: Secondary | ICD-10-CM

## 2016-08-25 DIAGNOSIS — E119 Type 2 diabetes mellitus without complications: Secondary | ICD-10-CM

## 2016-08-25 DIAGNOSIS — Z113 Encounter for screening for infections with a predominantly sexual mode of transmission: Secondary | ICD-10-CM | POA: Insufficient documentation

## 2016-08-25 DIAGNOSIS — R002 Palpitations: Secondary | ICD-10-CM

## 2016-08-25 DIAGNOSIS — E781 Pure hyperglyceridemia: Secondary | ICD-10-CM

## 2016-08-25 DIAGNOSIS — B20 Human immunodeficiency virus [HIV] disease: Secondary | ICD-10-CM

## 2016-08-25 LAB — COMPLETE METABOLIC PANEL WITH GFR
ALBUMIN: 4 g/dL (ref 3.6–5.1)
ALK PHOS: 136 U/L — AB (ref 40–115)
ALT: 75 U/L — ABNORMAL HIGH (ref 9–46)
AST: 67 U/L — AB (ref 10–35)
BUN: 14 mg/dL (ref 7–25)
CO2: 21 mmol/L (ref 20–31)
Calcium: 9.1 mg/dL (ref 8.6–10.3)
Chloride: 104 mmol/L (ref 98–110)
Creat: 0.96 mg/dL (ref 0.70–1.33)
GFR, EST NON AFRICAN AMERICAN: 89 mL/min (ref >=60)
GFR, Est African American: >89 mL/min (ref >=60)
GLUCOSE: 251 mg/dL — AB (ref 65–99)
POTASSIUM: 4.6 mmol/L (ref 3.5–5.3)
SODIUM: 136 mmol/L (ref 135–146)
Total Bilirubin: 0.7 mg/dL (ref 0.2–1.2)
Total Protein: 7.4 g/dL (ref 6.1–8.1)

## 2016-08-25 LAB — CBC WITH DIFFERENTIAL/PLATELET
Basophils Absolute: 60 cells/uL (ref 0–200)
Basophils Relative: 1 %
EOS PCT: 2 %
Eosinophils Absolute: 120 cells/uL (ref 15–500)
HCT: 49 % (ref 38.5–50.0)
HEMOGLOBIN: 16.5 g/dL (ref 13.2–17.1)
LYMPHS ABS: 2820 {cells}/uL (ref 850–3900)
Lymphocytes Relative: 47 %
MCH: 32 pg (ref 27.0–33.0)
MCHC: 33.7 g/dL (ref 32.0–36.0)
MCV: 95 fL (ref 80.0–100.0)
MONOS PCT: 6 %
MPV: 10.3 fL (ref 7.5–12.5)
Monocytes Absolute: 360 cells/uL (ref 200–950)
NEUTROS ABS: 2640 {cells}/uL (ref 1500–7800)
NEUTROS PCT: 44 %
PLATELETS: 217 10*3/uL (ref 140–400)
RBC: 5.16 MIL/uL (ref 4.20–5.80)
RDW: 13.4 % (ref 11.0–15.0)
WBC: 6 10*3/uL (ref 3.8–10.8)

## 2016-08-25 LAB — LIPID PANEL
Cholesterol: 132 mg/dL (ref 125–200)
HDL: 24 mg/dL — ABNORMAL LOW (ref >=40)
LDL CALC: 50 mg/dL (ref <130)
Total CHOL/HDL Ratio: 5.5 Ratio — ABNORMAL HIGH (ref <=5.0)
Triglycerides: 291 mg/dL — ABNORMAL HIGH (ref <150)
VLDL: 58 mg/dL — ABNORMAL HIGH (ref <30)

## 2016-08-25 MED FILL — INTELENCE 200 MG TABLET: 200 | 30 days supply | Qty: 60 | Fill #3

## 2016-08-25 MED FILL — TRUVADA 200-300 MG TABS: 200-300 | 30 days supply | Qty: 30 | Fill #3

## 2016-08-26 LAB — MICROALBUMIN / CREATININE URINE RATIO
CREATININE, URINE: 167 mg/dL (ref 20–370)
MICROALB/CREAT RATIO: 34 ug/mg{creat} — AB (ref <30)
Microalb, Ur: 5.7 mg/dL

## 2016-08-26 LAB — URINE CYTOLOGY ANCILLARY ONLY
Chlamydia: NEGATIVE
Neisseria Gonorrhea: NEGATIVE

## 2016-08-26 LAB — T-HELPER CELL (CD4) - (RCID CLINIC ONLY)
CD4 % Helper T Cell: 26 % — ABNORMAL LOW (ref 33–55)
CD4 T Cell Abs: 820 /uL (ref 400–2700)

## 2016-08-26 LAB — RPR

## 2016-08-27 LAB — HIV-1 RNA QUANT-NO REFLEX-BLD

## 2016-09-08 ENCOUNTER — Ambulatory Visit: Payer: 59 | Admitting: Infectious Disease

## 2016-09-15 ENCOUNTER — Encounter: Payer: Self-pay | Admitting: Infectious Disease

## 2016-09-15 ENCOUNTER — Ambulatory Visit (INDEPENDENT_AMBULATORY_CARE_PROVIDER_SITE_OTHER): Payer: 59 | Admitting: Infectious Disease

## 2016-09-15 VITALS — BP 135/87 | HR 72 | Temp 98.5°F | Ht 72.0 in | Wt 243.0 lb

## 2016-09-15 DIAGNOSIS — G4700 Insomnia, unspecified: Secondary | ICD-10-CM

## 2016-09-15 DIAGNOSIS — R21 Rash and other nonspecific skin eruption: Secondary | ICD-10-CM | POA: Diagnosis not present

## 2016-09-15 DIAGNOSIS — E119 Type 2 diabetes mellitus without complications: Secondary | ICD-10-CM

## 2016-09-15 DIAGNOSIS — E669 Obesity, unspecified: Secondary | ICD-10-CM

## 2016-09-15 DIAGNOSIS — B20 Human immunodeficiency virus [HIV] disease: Secondary | ICD-10-CM | POA: Diagnosis not present

## 2016-09-15 DIAGNOSIS — E118 Type 2 diabetes mellitus with unspecified complications: Secondary | ICD-10-CM

## 2016-09-15 DIAGNOSIS — E781 Pure hyperglyceridemia: Secondary | ICD-10-CM

## 2016-09-15 DIAGNOSIS — Z23 Encounter for immunization: Secondary | ICD-10-CM | POA: Diagnosis not present

## 2016-09-15 DIAGNOSIS — E1169 Type 2 diabetes mellitus with other specified complication: Secondary | ICD-10-CM

## 2016-09-15 HISTORY — DX: Rash and other nonspecific skin eruption: R21

## 2016-09-15 MED ORDER — METFORMIN HCL 500 MG PO TABS: 500.0000 mg | ORAL_TABLET | Freq: Two times a day (BID) | ORAL | 11 refills | Status: DC

## 2016-09-15 MED ORDER — EMTRICITABINE-TENOFOVIR AF 200-25 MG PO TABS: 1.0000 | ORAL_TABLET | Freq: Every day | ORAL | 11 refills | Status: DC

## 2016-09-15 MED FILL — metFORMIN HCL 500 MG TABS: 500 | 30 days supply | Qty: 60 | Fill #0

## 2016-09-15 NOTE — Progress Notes (Signed)
Chief complaint: rash on his left arm Subjective:    Patient ID: Ricardo [name], male    DOB: [birth year], 55 y.o.   MRN: [id number]  HPI  70 Caucasian man with HIV who had been on  reyataz, norvir and truvada with perfect virological suppression and whom we changed to Tivicay and Truvada in December 2013.   On recheck of his viral load was undetectable CD4 count was quite high above 700. He then was concerned that the Tivicay had caused elevated TG.   . Note his triglycerides have been 463 in May of 2014 he was RE on TIVICAY and Truvada. He also began to monitor his blood sugar now and noticed blood sugars are running anywhere between 180-2 200 or 400.   NOTE HE HAD STOPPED METFORMIN AND GLIPIZIDE DUE TO WEIGHT LOSS AT THAT TIME AND A1C WAS AT 6.  In the interim he began reading about TIVICAY and also the tricyclic  antidepressant that would place him on amitriptyline and found mention of elevated blood sugars with both of these drugs. There is mention of other blood sugars and 5% of individuals taking TIVICAY also mention of this with amitriptyline.  We then reviewed various other antiretroviral regimens to change him to any and opted for400 milligrams of Intelence once daily with Truvada.   I suspected on closer review of his chart that his blood sugars really or higher due to a combination of his weight gain his stopping metformin and his stopping glipizide.  He got back on metformin and was being seen by Dr. Loanne Drilling who started pioglitazone. Pt incidentally HAd NEVER GONE BACK TO BID METFORMIN ANd was only on 1g daily when he saw Dr. Loanne Drilling.  I last saw him this fall and his virus remained nicely suppressed.   However he was again not taking his metfomin.   He now states that he is also not taking his pioglitazone either and treating his BG with eating cucumbers. He says he has lost weight again.  He is accompanied by his wife of 18 years to the clinic visit today.  He is c/o  rash on his arm that comes and goes and which he thinks is due to shingles. It is NOT accompanied by pain and is on his forearm and scarred. Not clear what is causing this though I doubt it is VZV    Past Medical History:  Diagnosis Date  . Diabetes mellitus type 2 in obese ([place]) 11/20/2015  . Diabetes mellitus without complication ([place])   . Diabetes type 2, uncontrolled ([place])   . HIV (human immunodeficiency virus infection) ([place])   . Hyperlipidemia   . LVH (left ventricular hypertrophy)   . Palpitations     No past surgical history on file.  Family History  Problem Relation Age of Onset  . Coronary artery disease Father 72      Social History   Social History  . Marital status: Married    Spouse name: N/A  . Number of children: N/A  . Years of education: N/A   Social History Main Topics  . Smoking status: Never Smoker  . Smokeless tobacco: Never Used  . Alcohol use No  . Drug use: No  . Sexual activity: Yes    Partners: Female     Comment: pt. declined condoms   Other Topics Concern  . None   Social History Narrative  . None    Allergies  Allergen Reactions  . Amoxicillin-Pot Clavulanate  Current Outpatient Prescriptions:  .  aspirin 81 MG tablet, Take 81 mg by mouth daily.  , Disp: , Rfl:  .  atenolol (TENORMIN) 50 MG tablet, TAKE ONE TABLET BY MOUTH ONCE DAILY, Disp: 30 tablet, Rfl: 11 .  Blood Glucose Monitoring Suppl (ONE TOUCH ULTRA 2) W/DEVICE KIT, Use to check blood sugars 1/day., Disp: 1 each, Rfl: 0 .  Etravirine 200 MG TABS, Take 2 tablets (400 mg total) by mouth daily., Disp: 60 tablet, Rfl: 5 .  glucose blood (ONE TOUCH ULTRA TEST) test strip, Use to check blood sugars 1/day., Disp: 100 each, Rfl: 3 .  pravastatin (PRAVACHOL) 20 MG tablet, TAKE ONE TABLET BY MOUTH ONCE DAILY - NEEDS APPOINTMENT., Disp: 30 tablet, Rfl: 5 .  triamcinolone ointment (KENALOG) 0.5 %, Apply 1 application topically 2 (two) times daily., Disp: 30 g, Rfl: 4 .   amitriptyline (ELAVIL) 25 MG tablet, Take 1 tablet (25 mg total) by mouth at bedtime. (Patient not taking: Reported on 09/15/2016), Disp: 30 tablet, Rfl: 11 .  emtricitabine-tenofovir AF (DESCOVY) 200-25 MG tablet, Take 1 tablet by mouth daily. DC truvada, Disp: 30 tablet, Rfl: 11 .  metFORMIN (GLUCOPHAGE) 500 MG tablet, Take 1 tablet (500 mg total) by mouth 2 (two) times daily with a meal., Disp: 60 tablet, Rfl: 11     Review of Systems  Constitutional: Negative for activity change, appetite change, chills, diaphoresis, fatigue, fever and unexpected weight change.  HENT: Negative for congestion, rhinorrhea, sinus pressure, sneezing, sore throat and trouble swallowing.   Eyes: Negative for photophobia and visual disturbance.  Respiratory: Negative for cough, chest tightness, shortness of breath, wheezing and stridor.   Cardiovascular: Negative for chest pain, palpitations and leg swelling.  Gastrointestinal: Negative for abdominal distention, abdominal pain, anal bleeding, blood in stool, constipation, diarrhea, nausea and vomiting.  Genitourinary: Negative for difficulty urinating, dysuria, flank pain and hematuria.  Musculoskeletal: Negative for arthralgias, back pain, gait problem, joint swelling and myalgias.  Skin: Positive for rash. Negative for color change, pallor and wound.  Neurological: Negative for dizziness, tremors, weakness and light-headedness.  Hematological: Negative for adenopathy. Does not bruise/bleed easily.  Psychiatric/Behavioral: Negative for agitation, behavioral problems, confusion, decreased concentration, dysphoric mood and sleep disturbance.       Objective:   Physical Exam  Constitutional: He is oriented to person, place, and time. He appears well-developed and well-nourished.  HENT:  Head: Normocephalic and atraumatic.  Eyes: Conjunctivae and EOM are normal.  Neck: Normal range of motion. Neck supple.  Cardiovascular: Normal rate and regular rhythm.     Pulmonary/Chest: Effort normal. No respiratory distress. He has no wheezes.  Abdominal: Soft. He exhibits no distension.  Musculoskeletal: Normal range of motion. He exhibits no edema or tenderness.  Neurological: He is alert and oriented to person, place, and time.  Skin: Skin is warm and dry. Rash noted. No erythema. No pallor.     Psychiatric: He has a normal mood and affect. His behavior is normal. Judgment and thought content normal.          Assessment & Plan:  HIV: continue  Intelence and Truvada doing well but we can change his Truvada to Descovy for better bone and kidney safety. He may lose insurance if he loses his job in a month and then need to go onto ADAP again.  DM: He has lost 13 pounds since November. I suggested he start low dose metfomin of 500 mg and that he see Dr. Loanne Drilling again and be seen by  PCP  Rash: recommended that when it first appears he tries to be seen by me or one of my partners vs Dermatology. We could unroof a vesicle if present and send for VZV PCR and/or perform punch biopsy   Hyperlipidemia: . Continue statin  Insomnia: continue trycyclic  LVH: continue beta blocker Vitals:   09/15/16 1615  BP: 135/87  Pulse: 72  Temp: 98.5 F (36.9 C)   Obesity: encouraged weight loss through diet and exercise  I spent greater than 25 minutes with the patient including greater than 50% of time in face to face counsel of the patient's HIV, DM 2, hyperlipidemia, insomnia, LVH, rash  and in coordination of his care.

## 2016-09-16 MED FILL — DESCOVY 200-25 MG TABS: 200-25 | 30 days supply | Qty: 30 | Fill #0

## 2016-09-17 MED FILL — INTELENCE 200 MG TABLET: 200 | 30 days supply | Qty: 60 | Fill #4

## 2016-09-23 ENCOUNTER — Other Ambulatory Visit: Payer: Self-pay | Admitting: Infectious Disease

## 2016-09-23 DIAGNOSIS — B20 Human immunodeficiency virus [HIV] disease: Secondary | ICD-10-CM

## 2016-10-05 ENCOUNTER — Telehealth: Payer: Self-pay | Admitting: *Deleted

## 2016-10-05 NOTE — Telephone Encounter (Signed)
Needing substitute blood pressure medication.  Atenolol not available.

## 2016-10-06 ENCOUNTER — Other Ambulatory Visit: Payer: Self-pay | Admitting: Pharmacist Clinician (PhC)/ Clinical Pharmacy Specialist

## 2016-10-06 MED ORDER — METOPROLOL SUCCINATE ER 100 MG PO TB24: 100.0000 mg | ORAL_TABLET | Freq: Every day | ORAL | 5 refills | Status: DC

## 2016-10-06 NOTE — Progress Notes (Signed)
Atenolol 50mg  qday changed to Toprol XL 100 mg qday due to national backorder

## 2016-10-06 NOTE — Progress Notes (Signed)
Thanks Minh! 

## 2016-10-13 MED FILL — INTELENCE 200 MG TABLET: 200 | 30 days supply | Qty: 60 | Fill #5

## 2016-10-19 MED FILL — DESCOVY 200-25 MG TABS: 200-25 | 30 days supply | Qty: 30 | Fill #1

## 2016-10-19 MED FILL — metFORMIN HCL 500 MG TABS: 500 | 30 days supply | Qty: 60 | Fill #1

## 2016-11-13 MED FILL — INTELENCE 200 MG TABLET: 200 | 30 days supply | Qty: 60 | Fill #6

## 2016-11-13 MED FILL — DESCOVY 200-25 MG TABS: 200-25 | 30 days supply | Qty: 30 | Fill #2

## 2016-11-13 MED FILL — metFORMIN HCL 500 MG TABS: 500 | 30 days supply | Qty: 60 | Fill #2

## 2016-11-16 ENCOUNTER — Encounter: Payer: Self-pay | Admitting: Infectious Disease

## 2016-11-16 ENCOUNTER — Ambulatory Visit (INDEPENDENT_AMBULATORY_CARE_PROVIDER_SITE_OTHER): Payer: 59 | Admitting: Infectious Disease

## 2016-11-16 VITALS — BP 138/86 | HR 86 | Temp 98.1°F | Ht 72.0 in | Wt 236.0 lb

## 2016-11-16 DIAGNOSIS — K739 Chronic hepatitis, unspecified: Secondary | ICD-10-CM

## 2016-11-16 DIAGNOSIS — R7989 Other specified abnormal findings of blood chemistry: Secondary | ICD-10-CM

## 2016-11-16 DIAGNOSIS — E781 Pure hyperglyceridemia: Secondary | ICD-10-CM

## 2016-11-16 DIAGNOSIS — E669 Obesity, unspecified: Secondary | ICD-10-CM

## 2016-11-16 DIAGNOSIS — E1169 Type 2 diabetes mellitus with other specified complication: Secondary | ICD-10-CM

## 2016-11-16 DIAGNOSIS — B20 Human immunodeficiency virus [HIV] disease: Secondary | ICD-10-CM | POA: Diagnosis not present

## 2016-11-16 DIAGNOSIS — E783 Hyperchylomicronemia: Secondary | ICD-10-CM | POA: Diagnosis not present

## 2016-11-16 DIAGNOSIS — G4700 Insomnia, unspecified: Secondary | ICD-10-CM

## 2016-11-16 DIAGNOSIS — R945 Abnormal results of liver function studies: Secondary | ICD-10-CM | POA: Insufficient documentation

## 2016-11-16 HISTORY — DX: Abnormal results of liver function studies: R94.5

## 2016-11-16 HISTORY — DX: Other specified abnormal findings of blood chemistry: R79.89

## 2016-11-16 LAB — CBC WITH DIFFERENTIAL/PLATELET
BASOS ABS: 52 {cells}/uL (ref 0–200)
Basophils Relative: 1 %
EOS PCT: 3 %
Eosinophils Absolute: 156 cells/uL (ref 15–500)
HCT: 46.2 % (ref 38.5–50.0)
HEMOGLOBIN: 15.7 g/dL (ref 13.2–17.1)
LYMPHS ABS: 2392 {cells}/uL (ref 850–3900)
Lymphocytes Relative: 46 %
MCH: 32.5 pg (ref 27.0–33.0)
MCHC: 34 g/dL (ref 32.0–36.0)
MCV: 95.7 fL (ref 80.0–100.0)
MPV: 10.1 fL (ref 7.5–12.5)
Monocytes Absolute: 312 cells/uL (ref 200–950)
Monocytes Relative: 6 %
NEUTROS ABS: 2288 {cells}/uL (ref 1500–7800)
Neutrophils Relative %: 44 %
Platelets: 198 10*3/uL (ref 140–400)
RBC: 4.83 MIL/uL (ref 4.20–5.80)
RDW: 13.6 % (ref 11.0–15.0)
WBC: 5.2 10*3/uL (ref 3.8–10.8)

## 2016-11-16 LAB — HEMOGLOBIN A1C
HEMOGLOBIN A1C: 9.2 % — AB (ref <5.7)
MEAN PLASMA GLUCOSE: 217 mg/dL

## 2016-11-16 LAB — COMPLETE METABOLIC PANEL WITH GFR
ALBUMIN: 3.9 g/dL (ref 3.6–5.1)
ALK PHOS: 118 U/L — AB (ref 40–115)
ALT: 53 U/L — ABNORMAL HIGH (ref 9–46)
AST: 60 U/L — ABNORMAL HIGH (ref 10–35)
BUN: 12 mg/dL (ref 7–25)
CO2: 26 mmol/L (ref 20–31)
Calcium: 8.8 mg/dL (ref 8.6–10.3)
Chloride: 104 mmol/L (ref 98–110)
Creat: 0.84 mg/dL (ref 0.70–1.33)
GFR, Est African American: >89 mL/min (ref >=60)
Glucose, Bld: 235 mg/dL — ABNORMAL HIGH (ref 65–99)
POTASSIUM: 4.2 mmol/L (ref 3.5–5.3)
SODIUM: 136 mmol/L (ref 135–146)
Total Bilirubin: 0.6 mg/dL (ref 0.2–1.2)
Total Protein: 7.2 g/dL (ref 6.1–8.1)

## 2016-11-16 LAB — LIPID PANEL
CHOLESTEROL: 183 mg/dL (ref <200)
HDL: 24 mg/dL — AB (ref >40)
TRIGLYCERIDES: 522 mg/dL — AB (ref <150)
Total CHOL/HDL Ratio: 7.6 Ratio — ABNORMAL HIGH (ref <5.0)

## 2016-11-16 MED ORDER — TRAZODONE HCL 50 MG PO TABS: 50.0000 mg | ORAL_TABLET | Freq: Every day | ORAL | 11 refills | Status: DC

## 2016-11-16 MED ORDER — METOPROLOL SUCCINATE ER 100 MG PO TB24: 100.0000 mg | ORAL_TABLET | Freq: Every day | ORAL | 5 refills | Status: DC

## 2016-11-16 MED FILL — traZODone HCL 50 MG TABS: 50 | 30 days supply | Qty: 30 | Fill #0

## 2016-11-16 MED FILL — METOPROLOL SUCC ER 100 MG T: 100 | 30 days supply | Qty: 30 | Fill #0

## 2016-11-16 NOTE — Progress Notes (Signed)
HPI: Ricardo [name] is a 55 y.o. male who presents today for HIV follow up.  Allergies: Allergies  Allergen Reactions  . Amoxicillin-Pot Clavulanate     Vitals: Temp: 98.1 F (36.7 C) (11/27 0845) Temp Source: Oral (11/27 0845) BP: 138/86 (11/27 0845) Pulse Rate: 86 (11/27 0845)  Past Medical History: Past Medical History:  Diagnosis Date  . Abnormal liver function tests 11/16/2016  . Diabetes mellitus type 2 in obese ([place]) 11/20/2015  . Diabetes mellitus without complication ([place])   . Diabetes type 2, uncontrolled ([place])   . HIV (human immunodeficiency virus infection) ([place])   . Hyperlipidemia   . LVH (left ventricular hypertrophy)   . Palpitations   . Rash and nonspecific skin eruption 09/15/2016    Social History: Social History   Social History  . Marital status: Married    Spouse name: N/A  . Number of children: N/A  . Years of education: N/A   Social History Main Topics  . Smoking status: Never Smoker  . Smokeless tobacco: Never Used  . Alcohol use No  . Drug use: No  . Sexual activity: Yes    Partners: Female     Comment: pt. declined condoms   Other Topics Concern  . None   Social History Narrative  . None    Current Regimen: Etravirine/Descovy  Labs: HIV 1 RNA Quant (copies/mL)  Date Value  08/25/2016 <20  11/20/2015 22 (H)  07/25/2015 <20   CD4 T Cell Abs (/uL)  Date Value  08/25/2016 820  11/20/2015 780  07/25/2015 690   Hep B S Ab (no units)  Date Value  02/14/2007 YES   Hepatitis B Surface Ag (no units)  Date Value  03/27/2008 NEGATIVE   HCV Ab (no units)  Date Value  03/27/2008 NEGATIVE    CrCl: CrCl cannot be calculated (Patient's most recent lab result is older than the maximum 21 days allowed.).  Lipids:    Component Value Date/Time   CHOL 132 08/25/2016 1339   TRIG 291 (H) 08/25/2016 1339   HDL 24 (L) 08/25/2016 1339   CHOLHDL 5.5 (H) 08/25/2016 1339   VLDL 58 (H) 08/25/2016 1339   LDLCALC 50 08/25/2016  1339    Assessment: [name] is a 55 year old male who presents today for his visit with Dr. Tommy [name] for HIV follow up. His HIV is under excellent control and he has great adherence to his Etravirine/Descovy. The patient requested that all of his medications be transferred to [place] because he is frustrated with [facility]. He does not have a primary care physician and his diabetes is managed by Dr. Tommy [name]. I was asked to discuss his diabetes regimen and glucose control with the patient.  [name] reports that he currently takes metformin 500 mg BID and checks his blood sugars at home. He states "they are sometimes good and sometimes not so good". When I clarified what "not so good" meant, he states usually somewhere in the 200s. We discussed adding an additional regimen for diabetes control. He was previously well controlled on pioglitazone 30 mg daily and denies any problems with this medication. The reason he had to stop taking it was that his insurance lapsed and it was costing him ~$150/month.   The patient will get labs today including a HgbA1c and lipid panel. If his HgbA1c is above goal (>7%) we will add pioglitazone 30 mg daily. This is available as a combination tablet with metformin as pioglitazone 15  mg - metformin 500 mg to be taken twice daily. He also has been off of his pravastatin. His last LDL was 50 (while taking pravastatin) and if still under good control will continue to keep him off of a statin.  Recommendations: - Transfer all medications to [place] - Continue etravirine and Descovy daily - Checking labs today, will follow up on HgbA1c and lipid profile - If HgbA1c is > 7%, will add pioglitazone 30 mg daily to his current metformin 500 mg BID  Ricardo [name], PharmD. PGY-2 Infectious Diseases Pharmacy Resident Pager: [phone] 11/16/2016, 9:38 AM

## 2016-11-16 NOTE — Progress Notes (Signed)
Chief complaint: Follow-up for HIV diabetes mellitus hypertension hyperlipidemia Subjective:    Patient ID: Ricardo [name], male    DOB: 03-Apr-1961, 55 y.o.   MRN: [id number]  HPI  8 Caucasian man with HIV who had been on  reyataz, norvir and truvada with perfect virological suppression and whom we changed to Tivicay and Truvada in December 2013.   On recheck of his viral load was undetectable CD4 count was quite high above 700. He then was concerned that the Tivicay had caused elevated TG.   . Note his triglycerides have been 463 in May of 2014 he was RE on TIVICAY and Truvada. He also began to monitor his blood sugar now and noticed blood sugars are running anywhere between 180-2 200 or 400.   NOTE HE HAD STOPPED METFORMIN AND GLIPIZIDE DUE TO WEIGHT LOSS AT THAT TIME AND A1C WAS AT 6.  In the interim he began reading about TIVICAY and also the tricyclic  antidepressant that would place him on amitriptyline and found mention of elevated blood sugars with both of these drugs. There is mention of other blood sugars and 5% of individuals taking TIVICAY also mention of this with amitriptyline.  We then reviewed various other antiretroviral regimens to change him to any and opted for400 milligrams of Intelence once daily with Truvada.   I suspected on closer review of his chart that his blood sugars really or higher due to a combination of his weight gain his stopping metformin and his stopping glipizide.  He got back on metformin and was being seen by Ricardo [name] who started pioglitazone. Pt incidentally HAd NEVER GONE BACK TO BID METFORMIN ANd was only on 1g daily when he saw Ricardo [name].  I last saw him this fall and his virus remained nicely suppressed.   However he was again not taking his metfomin.   He then  states that he is also not taking his pioglitazone either and treating his BG with eating cucumbers. He says he has lost weight again.   Also had a rash with Last but this  seems to disappear.  We start restart his metformin. He was also on a statin with an LDL of 50 but has now stopped.  He is having insomnia and it is not responding to his tricyclic antidepressant asked for Ambien.      Past Medical History:  Diagnosis Date  . Diabetes mellitus type 2 in obese ([place]) 11/20/2015  . Diabetes mellitus without complication ([place])   . Diabetes type 2, uncontrolled ([place])   . HIV (human immunodeficiency virus infection) ([place])   . Hyperlipidemia   . LVH (left ventricular hypertrophy)   . Palpitations   . Rash and nonspecific skin eruption 09/15/2016    No past surgical history on file.  Family History  Problem Relation Age of Onset  . Coronary artery disease Father 4      Social History   Social History  . Marital status: Married    Spouse name: N/A  . Number of children: N/A  . Years of education: N/A   Social History Main Topics  . Smoking status: Never Smoker  . Smokeless tobacco: Never Used  . Alcohol use No  . Drug use: No  . Sexual activity: Yes    Partners: Female     Comment: pt. declined condoms   Other Topics Concern  . None   Social History Narrative  . None    Allergies  Allergen Reactions  . Amoxicillin-Pot Clavulanate  Current Outpatient Prescriptions:  .  amitriptyline (ELAVIL) 25 MG tablet, Take 1 tablet (25 mg total) by mouth at bedtime. (Patient not taking: Reported on 09/15/2016), Disp: 30 tablet, Rfl: 11 .  aspirin 81 MG tablet, Take 81 mg by mouth daily.  , Disp: , Rfl:  .  Blood Glucose Monitoring Suppl (ONE TOUCH ULTRA 2) W/DEVICE KIT, Use to check blood sugars 1/day., Disp: 1 each, Rfl: 0 .  emtricitabine-tenofovir AF (DESCOVY) 200-25 MG tablet, Take 1 tablet by mouth daily. DC truvada, Disp: 30 tablet, Rfl: 11 .  Etravirine 200 MG TABS, Take 2 tablets (400 mg total) by mouth daily., Disp: 60 tablet, Rfl: 5 .  glucose blood (ONE TOUCH ULTRA TEST) test strip, Use to check blood sugars 1/day., Disp: 100  each, Rfl: 3 .  metFORMIN (GLUCOPHAGE) 500 MG tablet, Take 1 tablet (500 mg total) by mouth 2 (two) times daily with a meal., Disp: 60 tablet, Rfl: 11 .  metoprolol succinate (TOPROL XL) 100 MG 24 hr tablet, Take 1 tablet (100 mg total) by mouth daily. Take with or immediately following a meal., Disp: 30 tablet, Rfl: 5 .  pravastatin (PRAVACHOL) 20 MG tablet, TAKE ONE TABLET BY MOUTH ONCE DAILY - NEEDS APPOINTMENT., Disp: 30 tablet, Rfl: 5 .  triamcinolone ointment (KENALOG) 0.5 %, Apply 1 application topically 2 (two) times daily., Disp: 30 g, Rfl: 4     Review of Systems  Constitutional: Negative for activity change, appetite change, chills, diaphoresis, fatigue, fever and unexpected weight change.  HENT: Negative for congestion, rhinorrhea, sinus pressure, sneezing, sore throat and trouble swallowing.   Eyes: Negative for photophobia and visual disturbance.  Respiratory: Negative for cough, chest tightness, shortness of breath, wheezing and stridor.   Cardiovascular: Negative for chest pain, palpitations and leg swelling.  Gastrointestinal: Negative for abdominal distention, abdominal pain, anal bleeding, blood in stool, constipation, diarrhea, nausea and vomiting.  Genitourinary: Negative for difficulty urinating, dysuria, flank pain and hematuria.  Musculoskeletal: Negative for arthralgias, back pain, gait problem, joint swelling and myalgias.  Skin: Negative for color change, pallor and wound.  Neurological: Negative for dizziness, tremors, weakness and light-headedness.  Hematological: Negative for adenopathy. Does not bruise/bleed easily.  Psychiatric/Behavioral: Positive for sleep disturbance. Negative for agitation, behavioral problems, confusion, decreased concentration and dysphoric mood.       Objective:   Physical Exam  Constitutional: He is oriented to person, place, and time. He appears well-developed and well-nourished.  HENT:  Head: Normocephalic and atraumatic.  Eyes:  Conjunctivae and EOM are normal.  Neck: Normal range of motion. Neck supple.  Cardiovascular: Normal rate and regular rhythm.   Pulmonary/Chest: Effort normal. No respiratory distress. He has no wheezes.  Abdominal: Soft. He exhibits no distension.  Musculoskeletal: Normal range of motion. He exhibits no edema or tenderness.  Neurological: He is alert and oriented to person, place, and time.  Skin: Skin is warm and dry. No erythema. No pallor.  Psychiatric: He has a normal mood and affect. His behavior is normal. Judgment and thought content normal.          Assessment & Plan:  HIV: continue  Intelence and  Descovy for better bone and kidney safety.    DM: He restarted his metformin. We never got an A1c last time but will get it today. He says he likes the Januvia better than the metformin. Let's see how he can do a metformin if we can push the dose. Also have him meet with Ricardo [name] with pharmacy.  Hyperlipidemia: . Density LDL was at goal last time at 50 on the statin and now is off the statin let's reassess what his LDL looks like today.  Insomnia: See chart cyclic and try trazodone  LVH: continue beta blocker, the blood pressure remains in the range of this today he may need a second agent. Vitals:   11/16/16 0845  BP: 138/86  Pulse: 86  Temp: 98.1 F (36.7 C)   Abnormal liver function tests: I'll recheck his hepatitis B and C today. I'll also schedule an ultrasound of his right upper quadrant. Suspect he has fatty liver disease.    I spent greater than 25 minutes with the patient including greater than 50% of time in face to face counsel of the patient's HIV, DM 2, hyperlipidemia, insomnia,  and in coordination of his care.

## 2016-11-17 LAB — RPR

## 2016-11-17 LAB — MICROALBUMIN / CREATININE URINE RATIO
CREATININE, URINE: 235 mg/dL (ref 20–370)
MICROALB UR: 8.2 mg/dL
MICROALB/CREAT RATIO: 35 ug/mg{creat} — AB (ref <30)

## 2016-11-17 LAB — T-HELPER CELL (CD4) - (RCID CLINIC ONLY)
CD4 % Helper T Cell: 28 % — ABNORMAL LOW (ref 33–55)
CD4 T Cell Abs: 750 /uL (ref 400–2700)

## 2016-11-17 LAB — HEPATITIS B SURFACE ANTIGEN: HEP B S AG: NEGATIVE

## 2016-11-17 LAB — HEPATITIS C ANTIBODY: HCV AB: NEGATIVE

## 2016-11-18 ENCOUNTER — Telehealth: Payer: Self-pay

## 2016-11-18 LAB — HIV-1 RNA QUANT-NO REFLEX-BLD

## 2016-11-18 MED ORDER — PIOGLITAZONE HCL-METFORMIN HCL 15-850 MG PO TABS: 1.0000 | ORAL_TABLET | Freq: Two times a day (BID) | ORAL | 5 refills | Status: DC

## 2016-11-18 MED ORDER — ROSUVASTATIN CALCIUM 20 MG PO TABS: 20.0000 mg | ORAL_TABLET | Freq: Every day | ORAL | 5 refills | Status: DC

## 2016-11-18 NOTE — Telephone Encounter (Signed)
Called patient to discuss most recent lab results, HgbA1c and lipid panel. His HgbA1c was elevated at 9.2, he states during the last 3 months he has been on and off of metformin. I informed him that I called in a new medication pioglitazone-metformin 15-850 mg BID as we had discussed at the prior visit. The pharmacy only has this strength in stock and Ricardo [name] states he will try to tolerate the 850 mg dose of metformin, he will call if he has issues.   His lipid panel showed elevated triglycerides of 522, he had not been taking his pravastatin at this time. When I asked the patient if he had eaten the morning he had labs done, he states that he had not eaten and was fasting. With this elevation in triglycerides and an LDL that was unable to be calculated I also called in a prescription for Crestor 20 mg daily.   Dimitri Ped, PharmD, BCPS PGY-2 Infectious Diseases Pharmacy Resident Pager: [phone] 11/18/2016, 10:29 AM

## 2016-11-19 ENCOUNTER — Other Ambulatory Visit: Payer: Self-pay

## 2016-11-19 MED ORDER — ROSUVASTATIN CALCIUM 20 MG PO TABS: 20.0000 mg | ORAL_TABLET | Freq: Every day | ORAL | 5 refills | Status: DC

## 2016-11-19 MED ORDER — METFORMIN HCL 500 MG PO TABS: 500.0000 mg | ORAL_TABLET | Freq: Two times a day (BID) | ORAL | 5 refills | Status: DC

## 2016-11-19 MED ORDER — PIOGLITAZONE HCL 30 MG PO TABS: 30.0000 mg | ORAL_TABLET | Freq: Every day | ORAL | 5 refills | Status: DC

## 2016-11-19 MED ORDER — PIOGLITAZONE HCL-METFORMIN HCL 15-850 MG PO TABS: 1.0000 | ORAL_TABLET | Freq: Two times a day (BID) | ORAL | 5 refills | Status: DC

## 2016-11-19 MED FILL — ROSUVASTATIN CALCIUM 20 MG: 20 | 30 days supply | Qty: 30 | Fill #0

## 2016-11-19 MED FILL — PIOGLITAZONE HCL 30 MG TAB: 30 | 30 days supply | Qty: 30 | Fill #0

## 2016-11-19 NOTE — Telephone Encounter (Signed)
Axial much Ricardo [name] appreciated

## 2016-12-01 ENCOUNTER — Other Ambulatory Visit: Payer: Self-pay | Admitting: Infectious Disease

## 2016-12-01 DIAGNOSIS — B20 Human immunodeficiency virus [HIV] disease: Secondary | ICD-10-CM

## 2016-12-17 MED FILL — PIOGLITAZONE HCL 30 MG TAB: 30 | 30 days supply | Qty: 30 | Fill #1

## 2016-12-17 MED FILL — ROSUVASTATIN CALCIUM 20 MG: 20 | 30 days supply | Qty: 30 | Fill #1

## 2016-12-17 MED FILL — METOPROLOL SUCC ER 100 MG T: 100 | 30 days supply | Qty: 30 | Fill #1

## 2016-12-17 MED FILL — traZODone HCL 50 MG TABS: 50 | 30 days supply | Qty: 30 | Fill #1

## 2017-01-18 ENCOUNTER — Telehealth: Payer: Self-pay | Admitting: Pharmacist Clinician (PhC)/ Clinical Pharmacy Specialist

## 2017-01-18 ENCOUNTER — Other Ambulatory Visit: Payer: Self-pay | Admitting: *Deleted

## 2017-01-18 ENCOUNTER — Encounter: Payer: Self-pay | Admitting: Infectious Disease

## 2017-01-18 DIAGNOSIS — B20 Human immunodeficiency virus [HIV] disease: Secondary | ICD-10-CM

## 2017-01-18 MED ORDER — ETRAVIRINE 200 MG PO TABS: 2.0000 | ORAL_TABLET | Freq: Every day | ORAL | 3 refills | Status: DC

## 2017-01-18 MED ORDER — EMTRICITABINE-TENOFOVIR AF 200-25 MG PO TABS: 1.0000 | ORAL_TABLET | Freq: Every day | ORAL | 3 refills | Status: DC

## 2017-01-18 NOTE — Telephone Encounter (Signed)
Ricardo [name] has had issue with getting meds due to not having insurance and making too much income to get Intellence through [employer]. [name] has applied him for ADAP. He has about 7 days of ART left. I was planning to bring him in to give him some ISN to bridge him until ADAP approval but he said he has about 7 days left of ETR/Descovy and a full month supply of DTG/TRV. I told him it would be ok then. His metformin is currently at 1g/day so it would be fine with the DTG. He is going to transition to DTG/TRV when he runs out of ETR/Descovy.

## 2017-01-19 NOTE — Telephone Encounter (Signed)
Thanks Ricardo [name] but he will go back on other regimen when ADAP kicks in?

## 2017-02-04 ENCOUNTER — Telehealth: Payer: Self-pay | Admitting: Lab

## 2017-02-04 NOTE — Telephone Encounter (Signed)
I check with GI to get an update on this patient.  Lonzo Candy stated that they had tried 3 times to reach patient,11/28,12/5 & 12/01/17.  In trying to schedule the patient's appointment myself, Lonzo Candy stated that the diagnosis needs to reflect abnormal LFT's instead of HIV before she could schedule this appointment..  I sent a telephone message to Dr. Tommy Medal to correct

## 2017-02-04 NOTE — Telephone Encounter (Signed)
I do NOT know how to correct diagnoses after visits have occurred

## 2017-02-09 ENCOUNTER — Other Ambulatory Visit: Payer: Self-pay | Admitting: *Deleted

## 2017-02-09 DIAGNOSIS — R945 Abnormal results of liver function studies: Secondary | ICD-10-CM

## 2017-02-09 DIAGNOSIS — R7989 Other specified abnormal findings of blood chemistry: Secondary | ICD-10-CM

## 2017-02-16 ENCOUNTER — Telehealth: Payer: Self-pay | Admitting: *Deleted

## 2017-02-16 DIAGNOSIS — B20 Human immunodeficiency virus [HIV] disease: Secondary | ICD-10-CM

## 2017-02-16 NOTE — Telephone Encounter (Signed)
Patient called c/o right knee and right foot pain under his heel. He is scheduled for an ultrasound of his abdomen on 02/23/17 and he is concerned that his elevated LFT could have something to do with his pain. He said he is taking his diabetes medication. He was asking to have an xray the same day of his ultrasound. He is currently uninsured and I asked him if he was willing to get established with IM for primary care and he said yes. Ok to refer?

## 2017-02-17 NOTE — Telephone Encounter (Signed)
Definitely refer to PCP. CPK should also e checked if he is having muscle problems

## 2017-02-18 ENCOUNTER — Other Ambulatory Visit: Payer: Self-pay | Admitting: *Deleted

## 2017-02-18 DIAGNOSIS — B20 Human immunodeficiency virus [HIV] disease: Secondary | ICD-10-CM

## 2017-02-18 MED ORDER — ETRAVIRINE 200 MG PO TABS: 2.0000 | ORAL_TABLET | Freq: Every day | ORAL | 5 refills | Status: DC

## 2017-02-18 MED ORDER — EMTRICITABINE-TENOFOVIR AF 200-25 MG PO TABS: 1.0000 | ORAL_TABLET | Freq: Every day | ORAL | 5 refills | Status: DC

## 2017-02-19 NOTE — Telephone Encounter (Signed)
Patient notified of appointment with Internal Medicine at [facility] on 03/03/17 at 10:15 AM. He is aware to bring a $25 copay and he will then be scheduled with their financial advisor. Myrtis Hopping

## 2017-02-19 NOTE — Telephone Encounter (Signed)
Very good thx 

## 2017-02-23 ENCOUNTER — Ambulatory Visit
Admission: RE | Admit: 2017-02-23 | Discharge: 2017-02-23 | Disposition: A | Discharge status: Home / Self Care | Payer: No Typology Code available for payment source | Source: Ambulatory Visit | Attending: Infectious Disease | Admitting: Infectious Disease

## 2017-02-23 DIAGNOSIS — R945 Abnormal results of liver function studies: Secondary | ICD-10-CM

## 2017-02-23 DIAGNOSIS — R7989 Other specified abnormal findings of blood chemistry: Secondary | ICD-10-CM

## 2017-03-03 ENCOUNTER — Ambulatory Visit: Payer: 59

## 2017-03-22 ENCOUNTER — Other Ambulatory Visit: Payer: Self-pay | Admitting: *Deleted

## 2017-03-22 DIAGNOSIS — E1169 Type 2 diabetes mellitus with other specified complication: Secondary | ICD-10-CM

## 2017-03-22 DIAGNOSIS — E669 Obesity, unspecified: Principal | ICD-10-CM

## 2017-03-22 DIAGNOSIS — E781 Pure hyperglyceridemia: Secondary | ICD-10-CM

## 2017-03-22 DIAGNOSIS — I10 Essential (primary) hypertension: Secondary | ICD-10-CM

## 2017-03-22 MED ORDER — METOPROLOL SUCCINATE ER 100 MG PO TB24: 100.0000 mg | ORAL_TABLET | Freq: Every day | ORAL | 5 refills | Status: DC

## 2017-03-22 MED ORDER — PIOGLITAZONE HCL 30 MG PO TABS: 30.0000 mg | ORAL_TABLET | Freq: Every day | ORAL | 5 refills | Status: DC

## 2017-03-22 MED ORDER — ROSUVASTATIN CALCIUM 20 MG PO TABS: 20.0000 mg | ORAL_TABLET | Freq: Every day | ORAL | 5 refills | Status: DC

## 2017-03-22 MED ORDER — METFORMIN HCL 500 MG PO TABS: 500.0000 mg | ORAL_TABLET | Freq: Two times a day (BID) | ORAL | 5 refills | Status: DC

## 2017-03-22 NOTE — Progress Notes (Signed)
Patient now with ADAP.  RN sent metoprolol, rosuvastatin, metformin to [facility] for delivery.  Pioglitazone not covered by ADAP, but is on [place] for $12 at [employer].  RN sent Pioglitazone rx to [facility] in [place] (closest to patient). He was given all phone numbers. Landis Gandy, RN

## 2017-04-14 NOTE — Addendum Note (Signed)
Addended by: Lorne Skeens D on: 04/14/2017 04:21 PM   Modules accepted: Orders

## 2017-05-06 ENCOUNTER — Other Ambulatory Visit: Payer: 59

## 2017-05-19 ENCOUNTER — Ambulatory Visit: Payer: 59 | Admitting: Infectious Disease

## 2017-07-21 ENCOUNTER — Other Ambulatory Visit: Payer: Self-pay | Admitting: Infectious Disease

## 2017-07-21 DIAGNOSIS — B20 Human immunodeficiency virus [HIV] disease: Secondary | ICD-10-CM

## 2017-08-10 ENCOUNTER — Ambulatory Visit: Payer: Self-pay

## 2017-08-10 ENCOUNTER — Other Ambulatory Visit: Payer: Self-pay

## 2017-08-10 DIAGNOSIS — B20 Human immunodeficiency virus [HIV] disease: Secondary | ICD-10-CM

## 2017-08-10 LAB — CBC WITH DIFFERENTIAL/PLATELET
Basophils Absolute: 57 cells/uL (ref 0–200)
Basophils Relative: 1 %
EOS ABS: 171 {cells}/uL (ref 15–500)
Eosinophils Relative: 3 %
HEMATOCRIT: 44.1 % (ref 38.5–50.0)
Hemoglobin: 14.5 g/dL (ref 13.2–17.1)
LYMPHS ABS: 2394 {cells}/uL (ref 850–3900)
Lymphocytes Relative: 42 %
MCH: 31.8 pg (ref 27.0–33.0)
MCHC: 32.9 g/dL (ref 32.0–36.0)
MCV: 96.7 fL (ref 80.0–100.0)
MONO ABS: 456 {cells}/uL (ref 200–950)
MPV: 10.3 fL (ref 7.5–12.5)
Monocytes Relative: 8 %
NEUTROS ABS: 2622 {cells}/uL (ref 1500–7800)
Neutrophils Relative %: 46 %
PLATELETS: 228 10*3/uL (ref 140–400)
RBC: 4.56 MIL/uL (ref 4.20–5.80)
RDW: 13.2 % (ref 11.0–15.0)
WBC: 5.7 10*3/uL (ref 3.8–10.8)

## 2017-08-11 ENCOUNTER — Encounter: Payer: Self-pay | Admitting: Infectious Disease

## 2017-08-11 LAB — COMPLETE METABOLIC PANEL WITH GFR
ALT: 29 U/L (ref 9–46)
AST: 27 U/L (ref 10–35)
Albumin: 4.3 g/dL (ref 3.6–5.1)
Alkaline Phosphatase: 78 U/L (ref 40–115)
BILIRUBIN TOTAL: 0.4 mg/dL (ref 0.2–1.2)
BUN: 23 mg/dL (ref 7–25)
CALCIUM: 9.4 mg/dL (ref 8.6–10.3)
CO2: 22 mmol/L (ref 20–32)
CREATININE: 1.11 mg/dL (ref 0.70–1.33)
Chloride: 103 mmol/L (ref 98–110)
GFR, EST AFRICAN AMERICAN: 85 mL/min (ref >=60)
GFR, Est Non African American: 74 mL/min (ref >=60)
Glucose, Bld: 338 mg/dL — ABNORMAL HIGH (ref 65–99)
Potassium: 4.3 mmol/L (ref 3.5–5.3)
Sodium: 137 mmol/L (ref 135–146)
TOTAL PROTEIN: 7.2 g/dL (ref 6.1–8.1)

## 2017-08-11 LAB — T-HELPER CELL (CD4) - (RCID CLINIC ONLY)
CD4 T CELL ABS: 630 /uL (ref 400–2700)
CD4 T CELL HELPER: 25 % — AB (ref 33–55)

## 2017-08-13 LAB — HIV-1 RNA QUANT-NO REFLEX-BLD
HIV 1 RNA Quant: <20 copies/mL
HIV-1 RNA QUANT, LOG: NOT DETECTED {Log_copies}/mL

## 2017-08-17 ENCOUNTER — Other Ambulatory Visit: Payer: Self-pay | Admitting: Infectious Disease

## 2017-08-17 DIAGNOSIS — E1169 Type 2 diabetes mellitus with other specified complication: Secondary | ICD-10-CM

## 2017-08-17 DIAGNOSIS — B20 Human immunodeficiency virus [HIV] disease: Secondary | ICD-10-CM

## 2017-08-17 DIAGNOSIS — E669 Obesity, unspecified: Secondary | ICD-10-CM

## 2017-08-17 DIAGNOSIS — I10 Essential (primary) hypertension: Secondary | ICD-10-CM

## 2017-08-17 DIAGNOSIS — E781 Pure hyperglyceridemia: Secondary | ICD-10-CM

## 2017-08-24 ENCOUNTER — Ambulatory Visit (INDEPENDENT_AMBULATORY_CARE_PROVIDER_SITE_OTHER): Payer: Self-pay | Admitting: Infectious Disease

## 2017-08-24 ENCOUNTER — Encounter: Payer: Self-pay | Admitting: Infectious Disease

## 2017-08-24 VITALS — BP 134/85 | HR 76 | Temp 98.2°F | Wt 248.0 lb

## 2017-08-24 DIAGNOSIS — E1169 Type 2 diabetes mellitus with other specified complication: Secondary | ICD-10-CM

## 2017-08-24 DIAGNOSIS — B20 Human immunodeficiency virus [HIV] disease: Secondary | ICD-10-CM

## 2017-08-24 DIAGNOSIS — E669 Obesity, unspecified: Secondary | ICD-10-CM

## 2017-08-24 DIAGNOSIS — Z23 Encounter for immunization: Secondary | ICD-10-CM

## 2017-08-24 DIAGNOSIS — E782 Mixed hyperlipidemia: Secondary | ICD-10-CM

## 2017-08-24 DIAGNOSIS — G4700 Insomnia, unspecified: Secondary | ICD-10-CM

## 2017-08-24 MED ORDER — TRAZODONE HCL 50 MG PO TABS: 50.0000 mg | ORAL_TABLET | Freq: Every day | ORAL | 11 refills | Status: DC

## 2017-08-24 MED ORDER — METFORMIN HCL 500 MG PO TABS: 1000.0000 mg | ORAL_TABLET | Freq: Two times a day (BID) | ORAL | 11 refills | Status: DC

## 2017-08-24 NOTE — Progress Notes (Signed)
Chief complaint: Follow-up for HIV diabetes mellitus hypertension hyperlipidemia and also co of insominia Subjective:    Patient ID: Ricardo [name], male    DOB: [birth year], 56 y.o.   MRN: [id number]  HPI  84 Caucasian man with HIV who had been on  reyataz, norvir and truvada with perfect virological suppression and whom we changed to Tivicay and Truvada in December 2013.   On recheck of his viral load was undetectable CD4 count was quite high above 700. He then was concerned that the Tivicay had caused elevated TG.   . Note his triglycerides have been 463 in May of 2014 he was RE on TIVICAY and Truvada. He also began to monitor his blood sugar now and noticed blood sugars are running anywhere between 180-2 200 or 400.   NOTE HE HAD STOPPED METFORMIN AND GLIPIZIDE DUE TO WEIGHT LOSS AT THAT TIME AND A1C WAS AT 6.  In the interim he began reading about TIVICAY and also the tricyclic  antidepressant that would place him on amitriptyline and found mention of elevated blood sugars with both of these drugs. There is mention of other blood sugars and 5% of individuals taking TIVICAY also mention of this with amitriptyline.  We then reviewed various other antiretroviral regimens to change him to any and opted for400 milligrams of Intelence once daily with Truvada.   I suspected on closer review of his chart that his blood sugars really or higher due to a combination of his weight gain his stopping metformin and his stopping glipizide.  He got back on metformin and was being seen by Dr. Loanne Drilling who started pioglitazone. Pt incidentally HAd NEVER GONE BACK TO BID METFORMIN ANd was only on 1g daily when he saw Dr. Loanne Drilling.  I last saw him this fall and his virus remained nicely suppressed.   However he was again not taking his metfomin.   He then  states that he is also not taking his pioglitazone either and treating his BG with eating cucumbers. He says he has lost weight again.   Also had a  rash with Last but this seems to disappear.  We start restart his metformin.   He has been on pioglitazone consistently x 3 months after Cassie helped him get this for $15  He had been on metfomin 500 mg bid but in last week gone to 1g bid.      Past Medical History:  Diagnosis Date  . Abnormal liver function tests 11/16/2016  . Diabetes mellitus type 2 in obese ([place]) 11/20/2015  . Diabetes mellitus without complication ([place])   . Diabetes type 2, uncontrolled ([place])   . HIV (human immunodeficiency virus infection) ([place])   . Hyperlipidemia   . LVH (left ventricular hypertrophy)   . Palpitations   . Rash and nonspecific skin eruption 09/15/2016    No past surgical history on file.  Family History  Problem Relation Age of Onset  . Coronary artery disease Father 34      Social History   Social History  . Marital status: Married    Spouse name: N/A  . Number of children: N/A  . Years of education: N/A   Social History Main Topics  . Smoking status: Never Smoker  . Smokeless tobacco: Never Used  . Alcohol use No  . Drug use: No  . Sexual activity: Yes    Partners: Female     Comment: pt. declined condoms   Other Topics Concern  . Not on file  Social History Narrative  . No narrative on file    Allergies  Allergen Reactions  . Amoxicillin-Pot Clavulanate      Current Outpatient Prescriptions:  .  aspirin 81 MG tablet, Take 81 mg by mouth daily.  , Disp: , Rfl:  .  Blood Glucose Monitoring Suppl (ONE TOUCH ULTRA 2) W/DEVICE KIT, Use to check blood sugars 1/day., Disp: 1 each, Rfl: 0 .  CRESTOR 20 MG tablet, TAKE 1 TABLET(20 MG) BY MOUTH DAILY, Disp: 30 tablet, Rfl: 0 .  DESCOVY 200-25 MG tablet, TAKE 1 TABLET BY MOUTH DAILY, Disp: 30 tablet, Rfl: 0 .  glucose blood (ONE TOUCH ULTRA TEST) test strip, Use to check blood sugars 1/day., Disp: 100 each, Rfl: 3 .  INTELENCE 200 MG TABS, TAKE 2 TABLETS BY MOUTH DAILY, Disp: 60 tablet, Rfl: 0 .  metFORMIN  (GLUCOPHAGE) 500 MG tablet, TAKE 1 TABLET(500 MG) BY MOUTH TWICE DAILY WITH A MEAL, Disp: 60 tablet, Rfl: 0 .  metoprolol succinate (TOPROL-XL) 100 MG 24 hr tablet, TAKE 1 TABLET BY MOUTH EVERY DAY WITH OR IMMEDIALTY FOLLOWING A MEAL, Disp: 30 tablet, Rfl: 0 .  pioglitazone (ACTOS) 30 MG tablet, Take 1 tablet (30 mg total) by mouth daily., Disp: 30 tablet, Rfl: 5 .  traZODone (DESYREL) 50 MG tablet, Take 1 tablet (50 mg total) by mouth at bedtime., Disp: 30 tablet, Rfl: 11     Review of Systems  Constitutional: Negative for activity change, appetite change, chills, diaphoresis, fatigue, fever and unexpected weight change.  HENT: Negative for congestion, rhinorrhea, sinus pressure, sneezing, sore throat and trouble swallowing.   Eyes: Negative for photophobia and visual disturbance.  Respiratory: Negative for cough, chest tightness, shortness of breath, wheezing and stridor.   Cardiovascular: Negative for chest pain, palpitations and leg swelling.  Gastrointestinal: Negative for abdominal distention, abdominal pain, anal bleeding, blood in stool, constipation, diarrhea, nausea and vomiting.  Genitourinary: Negative for difficulty urinating, dysuria, flank pain and hematuria.  Musculoskeletal: Negative for arthralgias, back pain, gait problem, joint swelling and myalgias.  Skin: Negative for color change, pallor and wound.  Neurological: Negative for dizziness, tremors, weakness and light-headedness.  Hematological: Negative for adenopathy. Does not bruise/bleed easily.  Psychiatric/Behavioral: Positive for sleep disturbance. Negative for agitation, behavioral problems, confusion, decreased concentration and dysphoric mood.       Objective:   Physical Exam  Constitutional: He is oriented to person, place, and time. He appears well-developed and well-nourished.  HENT:  Head: Normocephalic and atraumatic.  Eyes: Conjunctivae and EOM are normal.  Neck: Normal range of motion. Neck supple.   Cardiovascular: Normal rate and regular rhythm.   Pulmonary/Chest: Effort normal. No respiratory distress. He has no wheezes.  Abdominal: Soft. He exhibits no distension.  Musculoskeletal: Normal range of motion. He exhibits no edema or tenderness.  Neurological: He is alert and oriented to person, place, and time.  Skin: Skin is warm and dry. No erythema. No pallor.  Psychiatric: He has a normal mood and affect. His behavior is normal. Judgment and thought content normal.          Assessment & Plan:  HIV: continue  Intelence and  Descovy for better bone and kidney safety.    DM: Will check an A1c if he cannot control this with oral drugs he is going to need to be on insulin eventually which he really is very much against filming other only option the has is to lose weight to try to get his diabetes under control.  Hyperlipidemia: . Continue statin   Lipid Panel     Component Value Date/Time   CHOL 183 11/16/2016 0911   TRIG 522 (H) 11/16/2016 0911   HDL 24 (L) 11/16/2016 0911   CHOLHDL 7.6 (H) 11/16/2016 0911   VLDL NOT CALC 11/16/2016 0911   LDLCALC NOT CALC 11/16/2016 0911   LDLDIRECT 82 11/20/2015 1113    Insomnia: continue trazadone  LVH: continue beta blocker, the blood pressure remains in the range of this today he may need a second agent. There were no vitals filed for this visit.    I spent greater than 25 minutes with the patient including greater than 50% of time in face to face counsel of the patient's HIV, DM 2, hyperlipidemia, insomnia,  and in coordination of his care.

## 2017-08-25 ENCOUNTER — Encounter: Payer: Self-pay | Admitting: Infectious Disease

## 2017-08-25 LAB — HEMOGLOBIN A1C
HEMOGLOBIN A1C: 8.4 % — AB (ref <5.7)
MEAN PLASMA GLUCOSE: 194 mg/dL

## 2017-09-10 ENCOUNTER — Other Ambulatory Visit: Payer: Self-pay | Admitting: Infectious Disease

## 2017-09-10 DIAGNOSIS — E781 Pure hyperglyceridemia: Secondary | ICD-10-CM

## 2017-09-10 DIAGNOSIS — I10 Essential (primary) hypertension: Secondary | ICD-10-CM

## 2017-09-10 DIAGNOSIS — B20 Human immunodeficiency virus [HIV] disease: Secondary | ICD-10-CM

## 2017-10-06 ENCOUNTER — Other Ambulatory Visit: Payer: Self-pay | Admitting: Infectious Disease

## 2017-10-06 DIAGNOSIS — B20 Human immunodeficiency virus [HIV] disease: Secondary | ICD-10-CM

## 2017-10-06 DIAGNOSIS — E781 Pure hyperglyceridemia: Secondary | ICD-10-CM

## 2017-12-27 ENCOUNTER — Ambulatory Visit: Payer: No Typology Code available for payment source | Admitting: Infectious Disease

## 2017-12-28 ENCOUNTER — Other Ambulatory Visit: Payer: Self-pay | Admitting: Infectious Disease

## 2017-12-28 DIAGNOSIS — I10 Essential (primary) hypertension: Secondary | ICD-10-CM

## 2018-01-25 ENCOUNTER — Other Ambulatory Visit: Payer: Self-pay | Admitting: Pharmacist

## 2018-01-25 DIAGNOSIS — G4700 Insomnia, unspecified: Secondary | ICD-10-CM

## 2018-01-25 DIAGNOSIS — E1169 Type 2 diabetes mellitus with other specified complication: Secondary | ICD-10-CM

## 2018-01-25 DIAGNOSIS — E669 Obesity, unspecified: Secondary | ICD-10-CM

## 2018-01-25 DIAGNOSIS — E781 Pure hyperglyceridemia: Secondary | ICD-10-CM

## 2018-01-25 DIAGNOSIS — B20 Human immunodeficiency virus [HIV] disease: Secondary | ICD-10-CM

## 2018-01-25 DIAGNOSIS — I10 Essential (primary) hypertension: Secondary | ICD-10-CM

## 2018-01-25 MED ORDER — GLUCOSE BLOOD VI STRP: ORAL_STRIP | 11 refills | Status: DC

## 2018-01-25 MED ORDER — METOPROLOL SUCCINATE ER 100 MG PO TB24: 100.0000 mg | ORAL_TABLET | Freq: Every day | ORAL | 5 refills | Status: DC

## 2018-01-25 MED ORDER — ETRAVIRINE 200 MG PO TABS: 2.0000 | ORAL_TABLET | Freq: Every day | ORAL | 5 refills | Status: DC

## 2018-01-25 MED ORDER — ROSUVASTATIN CALCIUM 20 MG PO TABS: 20.0000 mg | ORAL_TABLET | Freq: Every day | ORAL | 5 refills | Status: DC

## 2018-01-25 MED ORDER — PIOGLITAZONE HCL 30 MG PO TABS: 30.0000 mg | ORAL_TABLET | Freq: Every day | ORAL | 5 refills | Status: DC

## 2018-01-25 MED ORDER — EMTRICITABINE-TENOFOVIR AF 200-25 MG PO TABS: 1.0000 | ORAL_TABLET | Freq: Every day | ORAL | 5 refills | Status: DC

## 2018-01-25 MED ORDER — METFORMIN HCL 500 MG PO TABS: 1000.0000 mg | ORAL_TABLET | Freq: Two times a day (BID) | ORAL | 11 refills | Status: DC

## 2018-01-25 MED ORDER — TRAZODONE HCL 50 MG PO TABS: 50.0000 mg | ORAL_TABLET | Freq: Every day | ORAL | 11 refills | Status: DC

## 2018-02-04 MED FILL — METOPROLOL SUCCINATE ER 100: 100 | 30 days supply | Qty: 30 | Fill #0

## 2018-02-04 MED FILL — metFORMIN HCL 500 MG TABS: 500 | 30 days supply | Qty: 120 | Fill #0

## 2018-02-04 MED FILL — INTELENCE 200 MG TABLET: 200 | 30 days supply | Qty: 60 | Fill #0

## 2018-02-04 MED FILL — traZODone HCL 50 MG TABS: 50 | 30 days supply | Qty: 30 | Fill #0

## 2018-02-04 MED FILL — PIOGLITAZONE HCL 15 MG TAB: 15 | 30 days supply | Qty: 60 | Fill #0

## 2018-02-04 MED FILL — DESCOVY 200-25 MG TABS: 200-25 | 30 days supply | Qty: 30 | Fill #0

## 2018-02-04 MED FILL — ROSUVASTATIN CALCIUM 20 MG: 20 | 30 days supply | Qty: 30 | Fill #0

## 2018-02-11 ENCOUNTER — Encounter: Payer: Self-pay | Admitting: Infectious Disease

## 2018-02-16 ENCOUNTER — Encounter: Payer: Self-pay | Admitting: *Deleted

## 2018-02-23 ENCOUNTER — Other Ambulatory Visit: Payer: Self-pay | Admitting: Infectious Disease

## 2018-02-23 DIAGNOSIS — E781 Pure hyperglyceridemia: Secondary | ICD-10-CM

## 2018-02-23 DIAGNOSIS — B20 Human immunodeficiency virus [HIV] disease: Secondary | ICD-10-CM

## 2018-03-01 ENCOUNTER — Telehealth: Payer: Self-pay | Admitting: *Deleted

## 2018-03-01 ENCOUNTER — Other Ambulatory Visit: Payer: Self-pay | Admitting: Pharmacist

## 2018-03-01 DIAGNOSIS — B20 Human immunodeficiency virus [HIV] disease: Secondary | ICD-10-CM

## 2018-03-01 NOTE — Telephone Encounter (Signed)
Patient wanted to make sure we knew he was not taking the Metformin any longer. He has not seen a big increase in his numbers. He also reports that the Trazodone keeps him awake and started back taking his Amitriptyline (he had on hand from past Rx). He would like to stop the Trazodone and have an Rx for Amitriptyline. Advised will ask the provider and get back to him.

## 2018-03-02 NOTE — Telephone Encounter (Signed)
ok to refill amitryptiline.  thanks

## 2018-03-03 ENCOUNTER — Other Ambulatory Visit: Payer: Self-pay | Admitting: *Deleted

## 2018-03-03 MED ORDER — AMITRIPTYLINE HCL 25 MG PO TABS: 25.0000 mg | ORAL_TABLET | Freq: Every day | ORAL | 5 refills | Status: DC

## 2018-03-03 NOTE — Telephone Encounter (Signed)
Called the patient to advise that he was given the [facility] and it was sent to the pharmacy and had to leave a message.

## 2018-03-03 NOTE — Telephone Encounter (Signed)
Medication sent to the pharmacy and patient notified on via voicemail.

## 2018-03-04 MED FILL — ROSUVASTATIN CALCIUM 20 MG: 20 | 30 days supply | Qty: 30 | Fill #1

## 2018-03-04 MED FILL — AMITRIPTYLINE HCL 25 MG TAB: 25 | 30 days supply | Qty: 30 | Fill #0

## 2018-03-04 MED FILL — PIOGLITAZONE HCL 15 MG TAB: 15 | 30 days supply | Qty: 60 | Fill #1

## 2018-03-04 MED FILL — DESCOVY 200-25 MG TABS: 200-25 | 30 days supply | Qty: 30 | Fill #1

## 2018-03-04 MED FILL — INTELENCE 200 MG TABLET: 200 | 30 days supply | Qty: 60 | Fill #1

## 2018-03-04 MED FILL — METOPROLOL SUCCINATE ER 100: 100 | 30 days supply | Qty: 30 | Fill #1

## 2018-03-22 ENCOUNTER — Other Ambulatory Visit: Payer: Managed Care, Other (non HMO)

## 2018-03-22 ENCOUNTER — Other Ambulatory Visit: Payer: Self-pay | Admitting: Behavioral Health

## 2018-03-22 DIAGNOSIS — Z113 Encounter for screening for infections with a predominantly sexual mode of transmission: Secondary | ICD-10-CM

## 2018-03-22 DIAGNOSIS — B20 Human immunodeficiency virus [HIV] disease: Secondary | ICD-10-CM

## 2018-03-22 DIAGNOSIS — Z79899 Other long term (current) drug therapy: Secondary | ICD-10-CM

## 2018-03-23 LAB — LIPID PANEL
CHOLESTEROL: 237 mg/dL — AB (ref <200)
HDL: 24 mg/dL — AB (ref >40)
Non-HDL Cholesterol (Calc): 213 mg/dL (calc) — ABNORMAL HIGH (ref <130)
TRIGLYCERIDES: 1236 mg/dL — AB (ref <150)
Total CHOL/HDL Ratio: 9.9 (calc) — ABNORMAL HIGH (ref <5.0)

## 2018-03-23 LAB — CBC WITH DIFFERENTIAL/PLATELET
BASOS PCT: 1.2 %
Basophils Absolute: 60 cells/uL (ref 0–200)
EOS ABS: 120 {cells}/uL (ref 15–500)
Eosinophils Relative: 2.4 %
HCT: 42.4 % (ref 38.5–50.0)
Hemoglobin: 14.8 g/dL (ref 13.2–17.1)
Lymphs Abs: 2580 cells/uL (ref 850–3900)
MCH: 31.6 pg (ref 27.0–33.0)
MCHC: 34.9 g/dL (ref 32.0–36.0)
MCV: 90.6 fL (ref 80.0–100.0)
MONOS PCT: 7 %
MPV: 10.8 fL (ref 7.5–12.5)
NEUTROS PCT: 37.8 %
Neutro Abs: 1890 cells/uL (ref 1500–7800)
PLATELETS: 209 10*3/uL (ref 140–400)
RBC: 4.68 10*6/uL (ref 4.20–5.80)
RDW: 13.1 % (ref 11.0–15.0)
Total Lymphocyte: 51.6 %
WBC mixed population: 350 cells/uL (ref 200–950)
WBC: 5 10*3/uL (ref 3.8–10.8)

## 2018-03-23 LAB — COMPREHENSIVE METABOLIC PANEL
AG RATIO: 1.3 (calc) (ref 1.0–2.5)
ALBUMIN MSPROF: 4.2 g/dL (ref 3.6–5.1)
ALKALINE PHOSPHATASE (APISO): 81 U/L (ref 40–115)
ALT: 18 U/L (ref 9–46)
AST: 19 U/L (ref 10–35)
BILIRUBIN TOTAL: 0.4 mg/dL (ref 0.2–1.2)
BUN: 23 mg/dL (ref 7–25)
CO2: 23 mmol/L (ref 20–32)
Calcium: 9.3 mg/dL (ref 8.6–10.3)
Chloride: 106 mmol/L (ref 98–110)
Creat: 0.84 mg/dL (ref 0.70–1.33)
GLOBULIN: 3.3 g/dL (ref 1.9–3.7)
Glucose, Bld: 273 mg/dL — ABNORMAL HIGH (ref 65–99)
POTASSIUM: 4.3 mmol/L (ref 3.5–5.3)
Sodium: 134 mmol/L — ABNORMAL LOW (ref 135–146)
Total Protein: 7.5 g/dL (ref 6.1–8.1)

## 2018-03-23 LAB — T-HELPER CELL (CD4) - (RCID CLINIC ONLY)
CD4 % Helper T Cell: 28 % — ABNORMAL LOW (ref 33–55)
CD4 T Cell Abs: 800 /uL (ref 400–2700)

## 2018-03-23 LAB — RPR: RPR Ser Ql: NONREACTIVE

## 2018-03-24 LAB — HIV-1 RNA QUANT-NO REFLEX-BLD
HIV 1 RNA QUANT: NOT DETECTED {copies}/mL
HIV-1 RNA QUANT, LOG: NOT DETECTED {Log_copies}/mL

## 2018-04-05 ENCOUNTER — Encounter: Payer: No Typology Code available for payment source | Admitting: Infectious Disease

## 2018-04-09 ENCOUNTER — Encounter: Payer: Self-pay | Admitting: Infectious Disease

## 2018-04-25 ENCOUNTER — Encounter: Payer: Self-pay | Admitting: Infectious Disease

## 2018-05-05 MED FILL — INTELENCE 200 MG TABLET: 200 | 30 days supply | Qty: 60 | Fill #2

## 2018-05-05 MED FILL — DESCOVY 200-25 MG TABS: 200-25 | 30 days supply | Qty: 30 | Fill #2

## 2018-05-05 MED FILL — ROSUVASTATIN CALCIUM 20 MG: 20 | 30 days supply | Qty: 30 | Fill #2

## 2018-05-05 MED FILL — PIOGLITAZONE HCL 15 MG TAB: 15 | 30 days supply | Qty: 60 | Fill #2

## 2018-05-05 MED FILL — AMITRIPTYLINE HCL 25 MG TAB: 25 | 30 days supply | Qty: 30 | Fill #1

## 2018-05-05 MED FILL — METOPROLOL SUCCINATE ER 100: 100 | 30 days supply | Qty: 30 | Fill #2

## 2018-05-13 ENCOUNTER — Telehealth: Payer: Self-pay | Admitting: Pharmacist Clinician (PhC)/ Clinical Pharmacy Specialist

## 2018-05-13 NOTE — Telephone Encounter (Signed)
Ricardo [name] missed his last appt with Dr. Tommy Medal. Since he is booked all the way out till July, we put him down next week with Ricardo [name] since he is in town for an MRI.

## 2018-05-13 NOTE — Telephone Encounter (Signed)
Thanks Minh! 

## 2018-05-23 ENCOUNTER — Ambulatory Visit: Payer: No Typology Code available for payment source | Admitting: Infectious Diseases

## 2018-05-27 ENCOUNTER — Telehealth: Payer: Self-pay

## 2018-05-27 NOTE — Telephone Encounter (Signed)
Pt called today stating he needs clearance for a neck operation. PT stated he has an appt to see MD on 07/14/18, but needs the clearance soon to schedule the procedure. PT stated he had an appt for MRI same day and missed appt with Tommy Medal. The preforming MD is asking for pt to have clearance before he can schedule procedure. PT would like to know if it would be possible to have an appt to see Dr. Tommy Medal on a closer date. Also if Dr. Tommy Medal would be able to write a note for clearance regarding his neck procedure. Will route message to DR. [facility], [place]

## 2018-05-27 NOTE — Telephone Encounter (Signed)
Luis  Does Juanda Crumble NOW have a medical provider who is the pCP. He does not require "clearance" for surgery as far as his HIV.  The only appropriate medical question by a surgeon would be whether the patient is at [place] or pulmonary risk from a surgery.

## 2018-05-30 MED FILL — ROSUVASTATIN CALCIUM 20 MG: 20 | 30 days supply | Qty: 30 | Fill #3

## 2018-05-30 MED FILL — DESCOVY 200-25 MG TABS: 200-25 | 30 days supply | Qty: 30 | Fill #3

## 2018-05-30 MED FILL — INTELENCE 200 MG TABLET: 200 | 30 days supply | Qty: 60 | Fill #3

## 2018-05-30 MED FILL — AMITRIPTYLINE HCL 25 MG TAB: 25 | 30 days supply | Qty: 30 | Fill #2

## 2018-05-30 MED FILL — METOPROLOL SUCCINATE ER 100: 100 | 30 days supply | Qty: 30 | Fill #3

## 2018-05-30 MED FILL — PIOGLITAZONE HCL 15 MG TAB: 15 | 30 days supply | Qty: 60 | Fill #3

## 2018-05-30 NOTE — Telephone Encounter (Signed)
Spoke with Pt to follow up on his questions he had on Friday about clearance for surgery. Asked pt if he had a PCP. Pt stated that he did not and that he did not want a PCP. PT stated that Dr. Tommy Medal is the only provider he sees and is comfortable with witting a letter for clearance. Will relay this to Dr. Tommy Medal.

## 2018-06-08 IMAGING — US US ABDOMEN LIMITED
1 series · 14 of 25 positions shown · non-contrast
Comparison: Abdominal and pelvic CT scan April 20, 2013

CLINICAL DATA: Abnormal liver function studies, right lower
quadrant pain for 5 DIS 6 months.

EXAM:
US ABDOMEN LIMITED - RIGHT UPPER QUADRANT

[Series 1: us abdomen limited · 0.17mm/px · 14 of 46 slices shown]
[im 1/46]
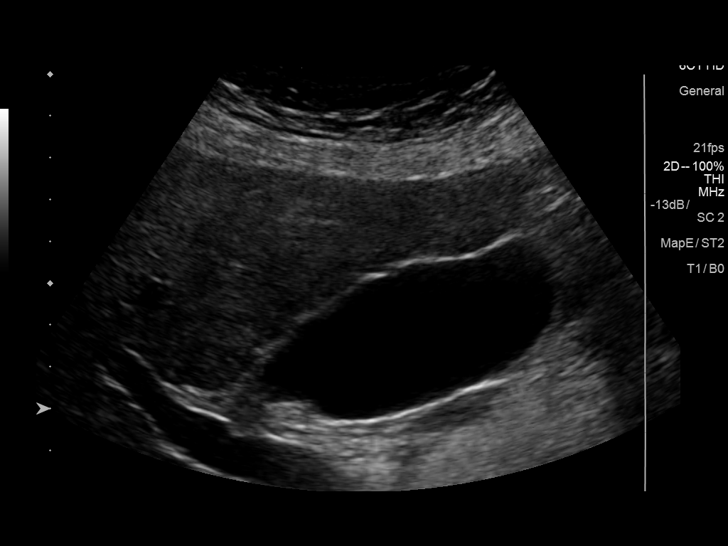
[im 4/46]
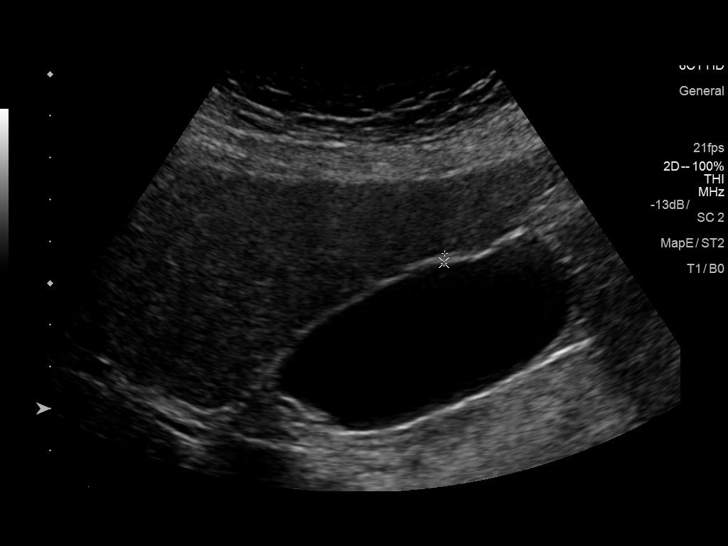
[im 8/46]
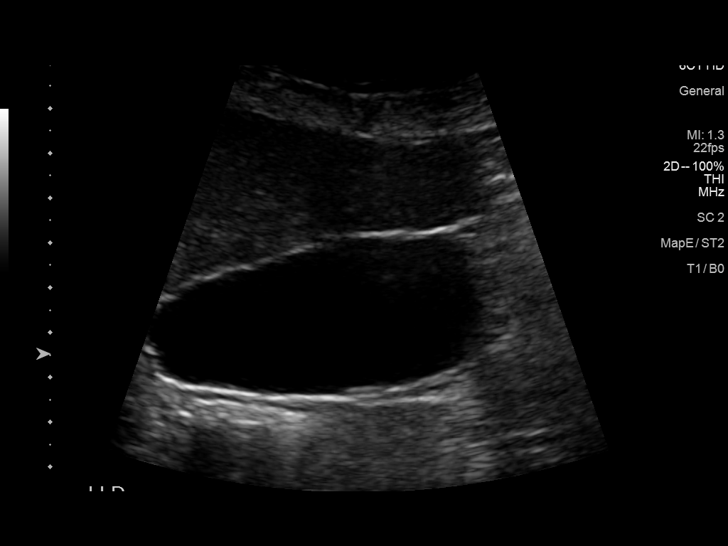
[im 12/46]
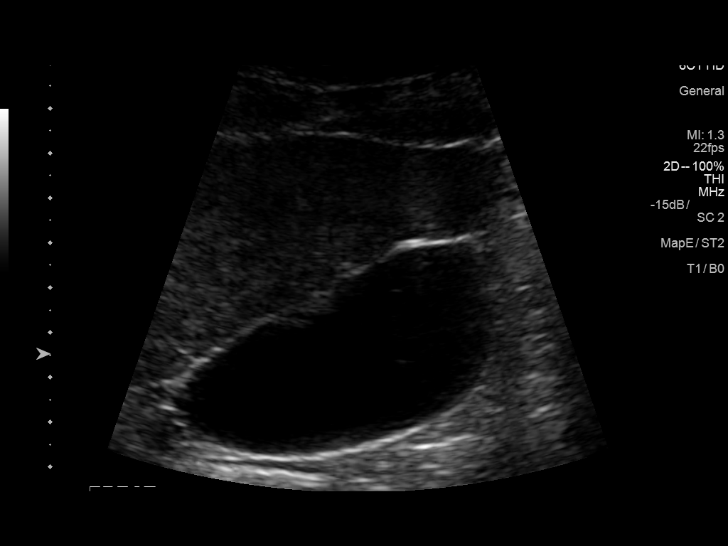
[im 16/46]
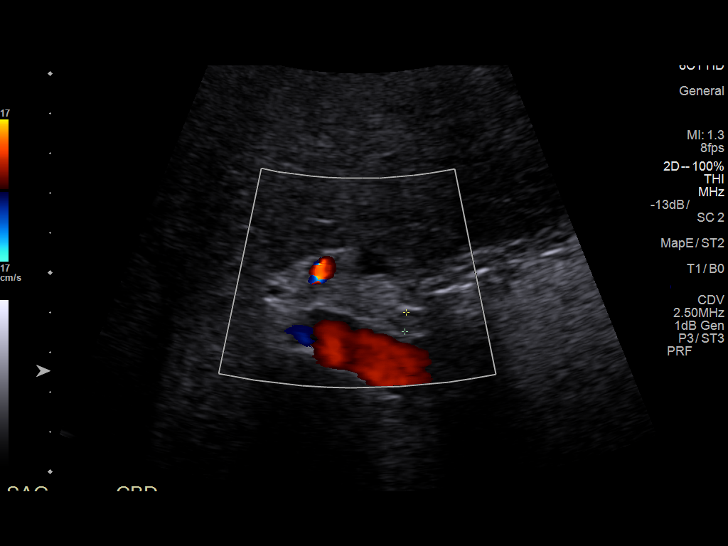
[im 17/46]
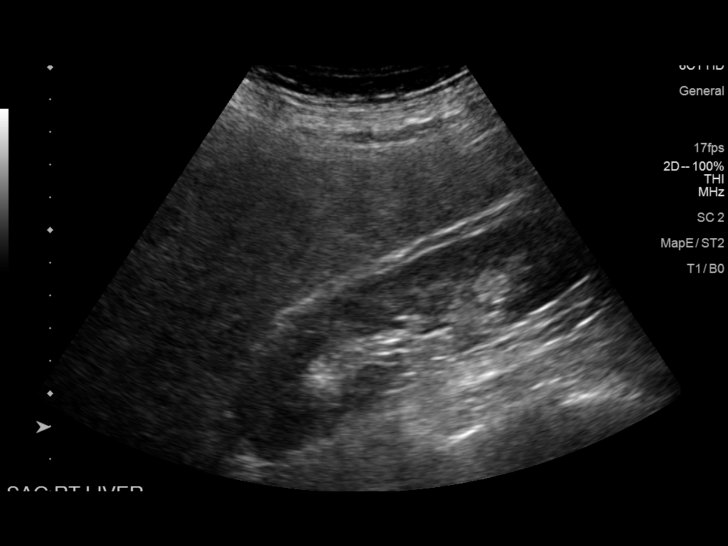
[im 21/46]
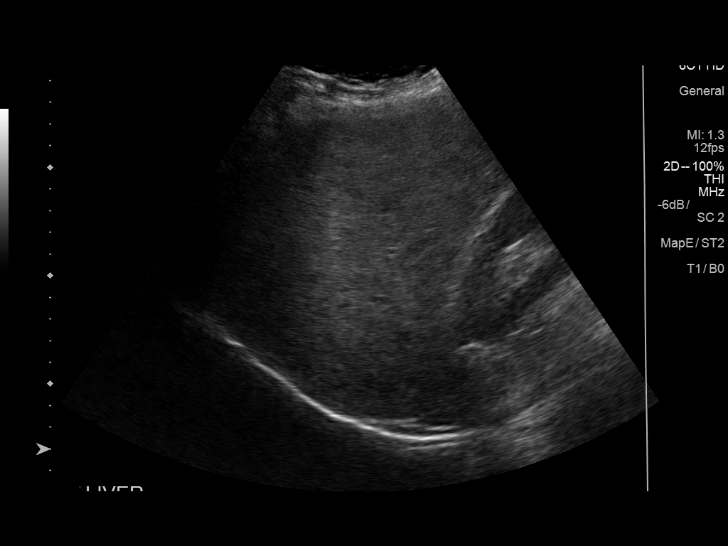
[im 25/46]
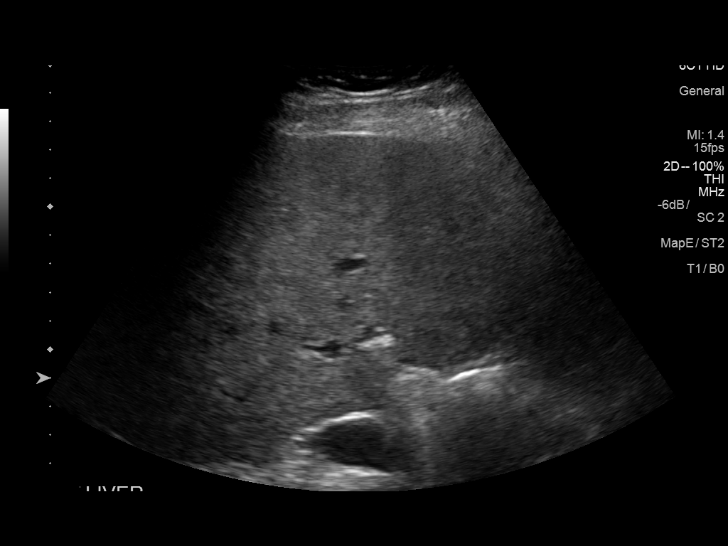
[im 29/46]
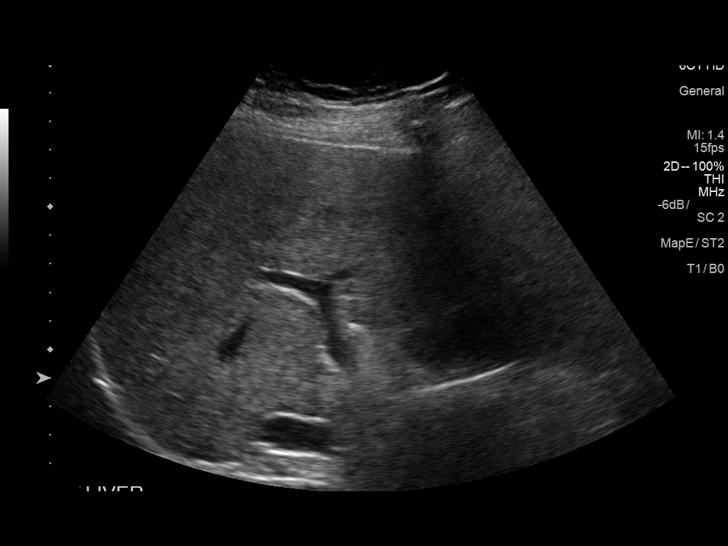
[im 31/46]
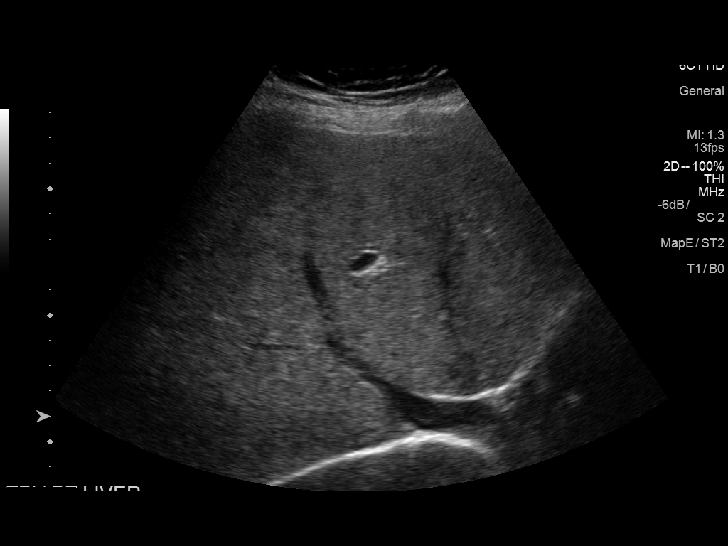
[im 34/46]
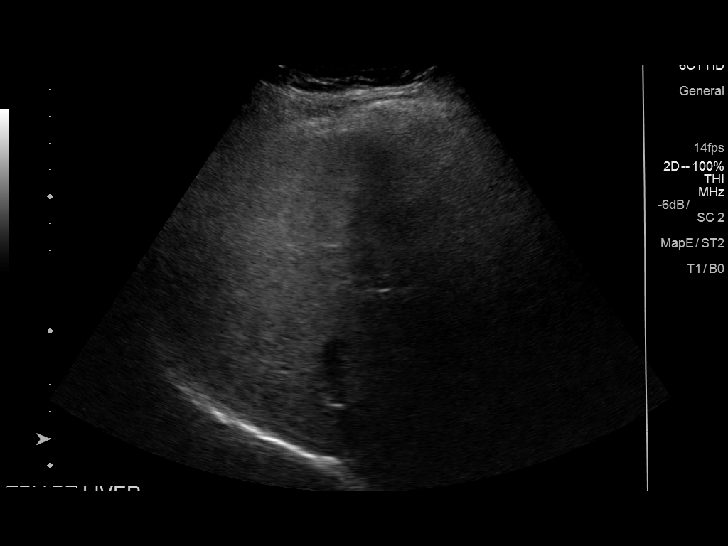
[im 38/46]
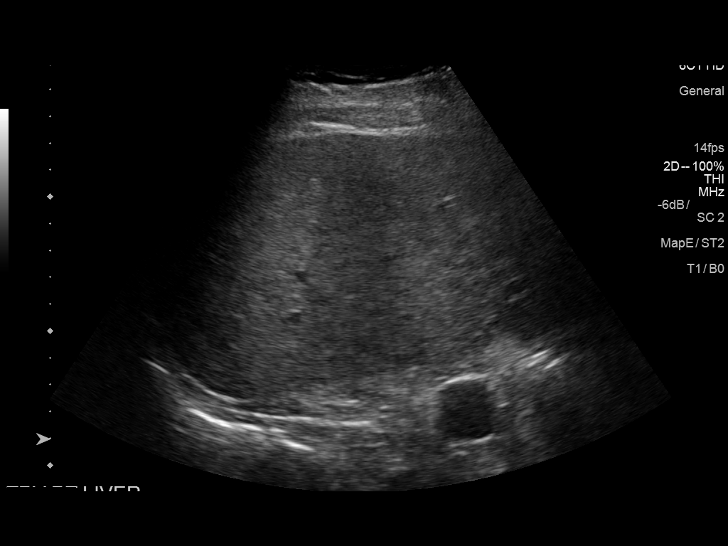
[im 42/46]
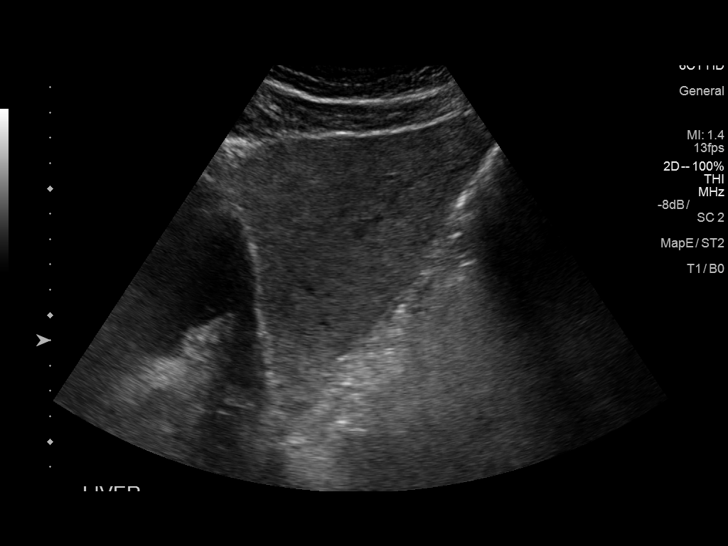
[im 46/46]
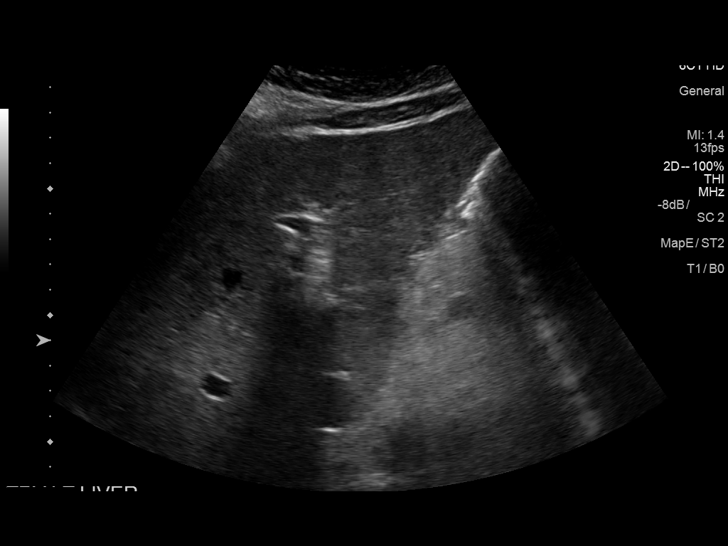

[14 of 25 positions shown; findings below may reference images not displayed]

FINDINGS: Gallbladder:

No gallstones or wall thickening visualized. No sonographic Murphy
sign noted by sonographer.

Common bile duct:

Diameter: 4.8 mm

Liver:

The hepatic echotexture is heterogeneously increased. There is no
focal mass or ductal dilation. The surface contour of the liver is
normal.
IMPRESSION: No gallstones or sonographic evidence of acute cholecystitis. If
there are clinical concerns of chronic cholecystitis, a nuclear
medicine hepatobiliary scan with gallbladder ejection fraction
determination may be useful.

Increased hepatic echotexture most compatible with fatty
infiltrative change.

## 2018-06-27 MED FILL — DESCOVY 200-25 MG TABS: 200-25 | 30 days supply | Qty: 30 | Fill #4

## 2018-06-27 MED FILL — PIOGLITAZONE HCL 15 MG TAB: 15 | 30 days supply | Qty: 60 | Fill #4

## 2018-06-27 MED FILL — INTELENCE 200 MG TABLET: 200 | 30 days supply | Qty: 60 | Fill #4

## 2018-06-27 MED FILL — ROSUVASTATIN CALCIUM 20 MG: 20 | 30 days supply | Qty: 30 | Fill #4

## 2018-06-27 MED FILL — METOPROLOL SUCCINATE ER 100: 100 | 30 days supply | Qty: 30 | Fill #4

## 2018-06-27 MED FILL — AMITRIPTYLINE HCL 25 MG TAB: 25 | 30 days supply | Qty: 30 | Fill #3

## 2018-07-08 ENCOUNTER — Telehealth: Payer: Self-pay

## 2018-07-08 NOTE — Telephone Encounter (Signed)
Patient walked into office to request Dr Tommy Medal to call Dr. Donivan Scull prior to his  Surgery to inform him he is HIV positive.  He has requested a doctor to doctor call.  The patient is scheduled for surgery soon and has not disclosed his HIV status because he felt he did not need to know.  Since he is now scheduled for surgery he would like to inform Dr Donivan Scull only and not his staff.   I informed patient it is his responsibility to inform health care providers and not our physicians.   Patient is on his way to a pre operative appointment and I have advised the patient to share his true medical history.  He was given a note from his last office visit with Dr Tommy Medal along with a list of current diagnosis from his medical records   We do have a singed release obtained today by me.   I will send this for scanning.    Laverle Patter, RN

## 2018-07-10 NOTE — Telephone Encounter (Signed)
Ricardo [name] HIV is well controlled so it shouldn't be an issue at all re his surgery. I agree though that it is not our resp to do what patient should do which is provide their medical hx to treating providers in complete manner

## 2018-07-14 ENCOUNTER — Ambulatory Visit: Payer: No Typology Code available for payment source | Admitting: Family

## 2018-07-14 ENCOUNTER — Ambulatory Visit: Payer: No Typology Code available for payment source | Admitting: Infectious Disease

## 2018-07-14 NOTE — Progress Notes (Deleted)
Subjective:    Patient ID: Ricardo [name], male    DOB: Feb 14, 1961, 57 y.o.   MRN: [id number]  No chief complaint on file.    HPI:  Ricardo [name] is a 57 y.o. male who presents today for routine follow up of his HIV disease and Type 2 diabetes.  1.) HIV disease - Ricardo [name] was last seen in the office for routine follow-up on 08/24/17 by Dr. Tommy Medal. He was well maintained on Descovy and Intelence with his most recent blood work on 03/22/18 with a viral load that was undetectable and a CD4 count of 800.  His RPR was non-reactive. He is on Crestor for his cholesterol.  He is due for the Prevnar vaccination and colonoscopy .     2.) Type 2 Diabetes - Ricardo [name] is currently prescribed metformin and pioglitazone. His last A1c on 08/24/17 was 8.4. Previously followed by Dr. Loanne Drilling, however has not been back to see him since 2015. From chart review his weight appears to fluctuate. It as recommended at his most recent office visit that he continue to take his oral medications as prescribed and work on losing weight.   Lab Results  Component Value Date   HGBA1C 8.4 (H) 08/24/2017     Allergies  Allergen Reactions  . Amoxicillin-Pot Clavulanate       Outpatient Medications Prior to Visit  Medication Sig Dispense Refill  . amitriptyline (ELAVIL) 25 MG tablet Take 1 tablet (25 mg total) by mouth at bedtime. 30 tablet 5  . aspirin 81 MG tablet Take 81 mg by mouth daily.      . Blood Glucose Monitoring Suppl (ONE TOUCH ULTRA 2) W/DEVICE KIT Use to check blood sugars 1/day. 1 each 0  . CRESTOR 20 MG tablet TAKE 1 TABLET(20 MG) BY MOUTH DAILY 30 tablet 4  . DESCOVY 200-25 MG tablet TAKE 1 TABLET BY MOUTH DAILY 30 tablet 4  . glucose blood (ONE TOUCH ULTRA TEST) test strip Use to check blood sugars three times daily. 100 each 11  . INTELENCE 200 MG TABS TAKE 2 TABLETS BY MOUTH DAILY 60 tablet 4  . metFORMIN (GLUCOPHAGE) 500 MG tablet Take 2 tablets (1,000 mg total) by mouth 2 (two)  times daily with a meal. 120 tablet 11  . metoprolol succinate (TOPROL-XL) 100 MG 24 hr tablet Take 1 tablet (100 mg total) by mouth daily. Take with or immediately following a meal. 30 tablet 5  . pioglitazone (ACTOS) 30 MG tablet Take 1 tablet (30 mg total) by mouth daily. 30 tablet 5  . traZODone (DESYREL) 50 MG tablet Take 1 tablet (50 mg total) by mouth at bedtime. 30 tablet 11   No facility-administered medications prior to visit.      Past Medical History:  Diagnosis Date  . Abnormal liver function tests 11/16/2016  . Diabetes mellitus type 2 in obese ([place]) 11/20/2015  . Diabetes mellitus without complication ([place])   . Diabetes type 2, uncontrolled ([place])   . HIV (human immunodeficiency virus infection) ([place])   . Hyperlipidemia   . LVH (left ventricular hypertrophy)   . Palpitations   . Rash and nonspecific skin eruption 09/15/2016     No past surgical history on file.     Review of Systems  Constitutional: Negative for appetite change, chills, fatigue, fever and unexpected weight change.  Eyes: Negative for visual disturbance.       Negative for changes in vision.  Respiratory: Negative for cough, chest tightness, shortness  of breath and wheezing.   Cardiovascular: Negative for chest pain, palpitations and leg swelling.  Gastrointestinal: Negative for abdominal pain, constipation, diarrhea, nausea and vomiting.  Endocrine: Negative for polydipsia, polyphagia and polyuria.  Genitourinary: Negative for dysuria, flank pain, frequency, genital sores, hematuria and urgency.  Skin: Negative for rash.  Allergic/Immunologic: Negative for immunocompromised state.  Neurological: Negative for dizziness, weakness, light-headedness and headaches.      Objective:    There were no vitals taken for this visit. Nursing note and vital signs reviewed.  Physical Exam  Constitutional: He is oriented to person, place, and time. He appears well-developed. No distress.  HENT:    Mouth/Throat: Oropharynx is clear and moist.  Eyes: Conjunctivae are normal.  Neck: Neck supple.  Cardiovascular: Normal rate, regular rhythm, normal heart sounds and intact distal pulses. Exam reveals no gallop and no friction rub.  No murmur heard. Pulmonary/Chest: Effort normal and breath sounds normal. No respiratory distress. He has no wheezes. He has no rales. He exhibits no tenderness.  Abdominal: Soft. Bowel sounds are normal. There is no tenderness.  Lymphadenopathy:    He has no cervical adenopathy.  Neurological: He is alert and oriented to person, place, and time.  Skin: Skin is warm and dry. No rash noted.  Psychiatric: He has a normal mood and affect. His behavior is normal. Judgment and thought content normal.       Assessment & Plan:   Problem List Items Addressed This Visit      Endocrine   Type 2 diabetes mellitus with other specified complication ([place]) - Primary     Other   Human immunodeficiency virus (HIV) disease       I am having Ricardo [name] maintain his aspirin, ONE TOUCH ULTRA 2, glucose blood, metFORMIN, metoprolol succinate, pioglitazone, traZODone, CRESTOR, DESCOVY, INTELENCE, and amitriptyline.   No orders of the defined types were placed in this encounter.    Follow-up: No follow-ups on file.   Terri Piedra, MSN, [facility]

## 2018-07-15 ENCOUNTER — Ambulatory Visit: Payer: Self-pay | Admitting: Emergency Medicine

## 2018-07-19 ENCOUNTER — Other Ambulatory Visit: Payer: Self-pay | Admitting: Pharmacist

## 2018-07-19 DIAGNOSIS — E669 Obesity, unspecified: Principal | ICD-10-CM

## 2018-07-19 DIAGNOSIS — E1169 Type 2 diabetes mellitus with other specified complication: Secondary | ICD-10-CM

## 2018-07-20 ENCOUNTER — Other Ambulatory Visit: Payer: Self-pay | Admitting: Pharmacist

## 2018-07-20 DIAGNOSIS — E669 Obesity, unspecified: Principal | ICD-10-CM

## 2018-07-20 DIAGNOSIS — E1169 Type 2 diabetes mellitus with other specified complication: Secondary | ICD-10-CM

## 2018-07-21 MED FILL — PIOGLITAZONE HCL 15 MG TAB: 15 | 30 days supply | Qty: 60 | Fill #5

## 2018-07-21 MED FILL — INTELENCE 200 MG TABLET: 200 | 30 days supply | Qty: 60 | Fill #5

## 2018-07-21 MED FILL — METOPROLOL SUCCINATE ER 100: 100 | 30 days supply | Qty: 30 | Fill #5

## 2018-07-21 MED FILL — DESCOVY 200-25 MG TABS: 200-25 | 30 days supply | Qty: 30 | Fill #5

## 2018-07-21 MED FILL — ROSUVASTATIN CALCIUM 20 MG: 20 | 30 days supply | Qty: 30 | Fill #5

## 2018-07-21 MED FILL — AMITRIPTYLINE HCL 25 MG TAB: 25 | 30 days supply | Qty: 30 | Fill #4

## 2018-08-23 ENCOUNTER — Other Ambulatory Visit: Payer: Self-pay | Admitting: Pharmacist

## 2018-08-23 DIAGNOSIS — I10 Essential (primary) hypertension: Secondary | ICD-10-CM

## 2018-08-23 DIAGNOSIS — E669 Obesity, unspecified: Secondary | ICD-10-CM

## 2018-08-23 DIAGNOSIS — E781 Pure hyperglyceridemia: Secondary | ICD-10-CM

## 2018-08-23 DIAGNOSIS — E1169 Type 2 diabetes mellitus with other specified complication: Secondary | ICD-10-CM

## 2018-08-23 DIAGNOSIS — B20 Human immunodeficiency virus [HIV] disease: Secondary | ICD-10-CM

## 2018-08-24 ENCOUNTER — Other Ambulatory Visit: Payer: Self-pay | Admitting: Pharmacist

## 2018-08-24 ENCOUNTER — Encounter: Payer: Self-pay | Admitting: Family

## 2018-08-24 ENCOUNTER — Ambulatory Visit (INDEPENDENT_AMBULATORY_CARE_PROVIDER_SITE_OTHER): Payer: Managed Care, Other (non HMO) | Admitting: Family

## 2018-08-24 VITALS — BP 123/81 | HR 88 | Temp 98.5°F | Ht 72.0 in | Wt 232.0 lb

## 2018-08-24 DIAGNOSIS — E781 Pure hyperglyceridemia: Secondary | ICD-10-CM

## 2018-08-24 DIAGNOSIS — I10 Essential (primary) hypertension: Secondary | ICD-10-CM

## 2018-08-24 DIAGNOSIS — B20 Human immunodeficiency virus [HIV] disease: Secondary | ICD-10-CM | POA: Diagnosis not present

## 2018-08-24 DIAGNOSIS — Z23 Encounter for immunization: Secondary | ICD-10-CM | POA: Diagnosis not present

## 2018-08-24 DIAGNOSIS — E1169 Type 2 diabetes mellitus with other specified complication: Secondary | ICD-10-CM

## 2018-08-24 MED ORDER — PIOGLITAZONE HCL 30 MG PO TABS: 30.0000 mg | ORAL_TABLET | Freq: Every day | ORAL | 2 refills | Status: DC

## 2018-08-24 MED ORDER — METFORMIN HCL 500 MG PO TABS: 1000.0000 mg | ORAL_TABLET | Freq: Two times a day (BID) | ORAL | 2 refills | Status: DC

## 2018-08-24 MED ORDER — EMTRICITABINE-TENOFOVIR AF 200-25 MG PO TABS: 1.0000 | ORAL_TABLET | Freq: Every day | ORAL | 4 refills | Status: DC

## 2018-08-24 MED ORDER — ETRAVIRINE 200 MG PO TABS: 2.0000 | ORAL_TABLET | Freq: Every day | ORAL | 4 refills | Status: DC

## 2018-08-24 NOTE — Progress Notes (Signed)
Flu vaccine and Prevnar 13 vaccine administered this visit. Patient tolerated well. Josia Cueva S. LPN

## 2018-08-24 NOTE — Patient Instructions (Addendum)
Nice to meet you.  Please make a lab appointment to complete the recommended blood work within the next month.   Plan for follow up office visit in 3 months or sooner if needed with Dr. Tommy Medal.

## 2018-08-24 NOTE — Progress Notes (Signed)
Subjective:    Patient ID: Ricardo [name], male    DOB: June 23, 1961, 57 y.o.   MRN: [id number]  Chief Complaint  Patient presents with  . HIV Positive/AIDS  . Diabetes     HPI:  ABRAHAM [name] is a 57 y.o. male who presents today for follow up of his HIV disease and discuss a new diabetes medication.   1.) HIV - [name] as last seen in the office on 08/24/17 and was virally suppressed with Intelence and Descovy with no adverse side effects. Review of his most recent blood work from 03/22/18 shows a viral load that was undetectable and CD4 count of 800. He is due for Prevnar and and influenza vaccination today.   [name] is continues to take his Intelence and Descovy as prescribed with no missed doses or adverse side effects. He has no problems obtaining or taking his medications. Denies fevers, chills, night sweats, headaches, changes in vision, neck pain/stiffness, nausea, diarrhea, vomiting, lesions or rashes.   2.) Type 2 diabetes - Previously seen in Endocrinology by Dr. Loanne Drilling but has not been back since 2015. Per last office visit on 08/24/17 he continued to have difficulties controlling his blood sugars and was prescribed pioglitazone and metformin. His most recent A1c is 8.4 which was from 08/24/17.   [name] continues to take metformin and pioglitazone as prescribed with no adverse side effects or hypoglycemic readings. He does not recall his 30 day average but reports blood sugars are generally between 100-200. Denies numbness/tingling, chest pain, shortness of breath, or skin lesions.   Allergies  Allergen Reactions  . Amoxicillin-Pot Clavulanate      Outpatient Medications Prior to Visit  Medication Sig Dispense Refill  . amitriptyline (ELAVIL) 25 MG tablet Take 1 tablet (25 mg total) by mouth at bedtime. 30 tablet 5  . aspirin 81 MG tablet Take 81 mg by mouth daily.      . Blood Glucose Monitoring Suppl (ONE TOUCH ULTRA 2) W/DEVICE KIT Use to check blood  sugars 1/day. 1 each 0  . CRESTOR 20 MG tablet TAKE 1 TABLET(20 MG) BY MOUTH DAILY 30 tablet 4  . glucose blood (ONE TOUCH ULTRA TEST) test strip Use to check blood sugars three times daily. 100 each 11  . metoprolol succinate (TOPROL-XL) 100 MG 24 hr tablet Take 1 tablet (100 mg total) by mouth daily. Take with or immediately following a meal. 30 tablet 5  . traZODone (DESYREL) 50 MG tablet Take 1 tablet (50 mg total) by mouth at bedtime. 30 tablet 11  . DESCOVY 200-25 MG tablet TAKE 1 TABLET BY MOUTH DAILY 30 tablet 4  . INTELENCE 200 MG TABS TAKE 2 TABLETS BY MOUTH DAILY 60 tablet 4  . metFORMIN (GLUCOPHAGE) 500 MG tablet Take 2 tablets (1,000 mg total) by mouth 2 (two) times daily with a meal. 120 tablet 11  . pioglitazone (ACTOS) 30 MG tablet Take 1 tablet (30 mg total) by mouth daily. 30 tablet 5   No facility-administered medications prior to visit.      Past Medical History:  Diagnosis Date  . Abnormal liver function tests 11/16/2016  . Diabetes mellitus type 2 in obese ([place]) 11/20/2015  . Diabetes mellitus without complication ([place])   . Diabetes type 2, uncontrolled ([place])   . HIV (human immunodeficiency virus infection) ([place])   . Hyperlipidemia   . LVH (left ventricular hypertrophy)   . Palpitations   . Rash and nonspecific skin eruption 09/15/2016  History reviewed. No pertinent surgical history.     Review of Systems  Constitutional: Negative for appetite change, chills, fatigue, fever and unexpected weight change.  Eyes: Negative for visual disturbance.       Negative for changes in vision.  Respiratory: Negative for cough, chest tightness, shortness of breath and wheezing.   Cardiovascular: Negative for chest pain, palpitations and leg swelling.  Gastrointestinal: Negative for abdominal pain, constipation, diarrhea, nausea and vomiting.  Endocrine: Negative for polydipsia, polyphagia and polyuria.  Genitourinary: Negative for dysuria, flank pain, frequency,  genital sores, hematuria and urgency.  Skin: Negative for rash.  Allergic/Immunologic: Negative for immunocompromised state.  Neurological: Negative for dizziness, weakness, light-headedness and headaches.      Objective:    BP 123/81   Pulse 88   Temp 98.5 F (36.9 C)   Ht 6' (1.829 m)   Wt 232 lb (105.2 kg)   BMI 31.46 kg/m  Nursing note and vital signs reviewed.  Physical Exam  Constitutional: He is oriented to person, place, and time. He appears well-developed. No distress.  HENT:  Mouth/Throat: Oropharynx is clear and moist.  Eyes: Conjunctivae are normal.  Neck: Neck supple.  Cardiovascular: Normal rate, regular rhythm, normal heart sounds and intact distal pulses. Exam reveals no gallop and no friction rub.  No murmur heard. Pulmonary/Chest: Effort normal and breath sounds normal. No respiratory distress. He has no wheezes. He has no rales. He exhibits no tenderness.  Abdominal: Soft. Bowel sounds are normal. There is no tenderness.  Lymphadenopathy:    He has no cervical adenopathy.  Neurological: He is alert and oriented to person, place, and time.  Skin: Skin is warm and dry. No rash noted.  Psychiatric: He has a normal mood and affect. His behavior is normal. Judgment and thought content normal.       Assessment & Plan:   Problem List Items Addressed This Visit      Endocrine   Type 2 diabetes mellitus with other specified complication [facility])    [name] continues to take his metoformin and pioglitazone with no adverse side effects or hypoglycemic readings. He reports no symptoms of neuropathy. Will check his A1c, CMP and urine microalbumin. Influenza vaccination updated today. Encouraged a low carbohydrate intake. Follow up office visit with Dr. Tommy Medal in 3 months or sooner for continued diabetic monitoring.       Relevant Medications   pioglitazone (ACTOS) 30 MG tablet   metFORMIN (GLUCOPHAGE) 500 MG tablet   Other Relevant Orders   HgB A1c   Urine  Microalbumin w/creat. ratio   Comprehensive metabolic panel   Lipid panel     Other   Human immunodeficiency virus (HIV) disease - Primary    Ricardo [name] has long standing well controlled HIV disease with his current medication regimen of Intelence and Descovy. He is adherent to the medication regimen with no adverse side effects. He has no signs/symptoms of progressive HIV disease or opportunistic infection through history or physical exam. Influenza and Prevnar updated today. Will check routine blood work including RPR, CD4 count, lipid profile and viral load. Continue current dosage of Intelence and Descovy. Plan for follow up office visit in 3 months or sooner if needed with Dr. Tommy Medal.       Relevant Medications   emtricitabine-tenofovir AF (DESCOVY) 200-25 MG tablet   Etravirine (INTELENCE) 200 MG TABS   Other Relevant Orders   Comprehensive metabolic panel   RPR   Lipid panel   HIV  1 RNA quant-no reflex-bld   T-helper cell (CD4)- ([facility] clinic only)   Pneumococcal conjugate vaccine 13-valent IM (Completed)    Other Visit Diagnoses    Need for immunization against influenza       Relevant Orders   Flu Vaccine QUAD 36+ mos IM (Completed)   Need for pneumococcal vaccine       Relevant Orders   Pneumococcal conjugate vaccine 13-valent IM (Completed)       I have changed Juanda Crumble D. Hollingworth's DESCOVY to emtricitabine-tenofovir AF and INTELENCE to Etravirine. I am also having him maintain his aspirin, ONE TOUCH ULTRA 2, glucose blood, metoprolol succinate, traZODone, CRESTOR, amitriptyline, pioglitazone, and metFORMIN.   Meds ordered this encounter  Medications  . pioglitazone (ACTOS) 30 MG tablet    Sig: Take 1 tablet (30 mg total) by mouth daily.    Dispense:  30 tablet    Refill:  2    Order Specific Question:   Supervising Provider    Answer:   Carlyle Basques [[id number]]  . emtricitabine-tenofovir AF (DESCOVY) 200-25 MG tablet    Sig: Take 1 tablet by mouth daily.     Dispense:  30 tablet    Refill:  4    Order Specific Question:   Supervising Provider    Answer:   Carlyle Basques [[id number]]  . Etravirine (INTELENCE) 200 MG TABS    Sig: Take 2 tablets (400 mg total) by mouth daily.    Dispense:  60 tablet    Refill:  4    Order Specific Question:   Supervising Provider    Answer:   Carlyle Basques [[id number]]  . metFORMIN (GLUCOPHAGE) 500 MG tablet    Sig: Take 2 tablets (1,000 mg total) by mouth 2 (two) times daily with a meal.    Dispense:  120 tablet    Refill:  2    Order Specific Question:   Supervising Provider    Answer:   Carlyle Basques [[id number]]     Follow-up: Return in about 3 months (around 11/23/2018), or if symptoms worsen or fail to improve.   Terri Piedra, MSN, FNP-C Nurse Practitioner [facility] Office phone: [phone] Pager: [place] number: [phone]

## 2018-08-25 ENCOUNTER — Encounter: Payer: Self-pay | Admitting: Family

## 2018-08-25 ENCOUNTER — Other Ambulatory Visit: Payer: Self-pay | Admitting: *Deleted

## 2018-08-25 MED FILL — DESCOVY 200-25 MG TABS: 200-25 | 30 days supply | Qty: 30 | Fill #0

## 2018-08-25 MED FILL — METOPROLOL SUCCINATE ER 100: 100 | 30 days supply | Qty: 30 | Fill #0

## 2018-08-25 MED FILL — PIOGLITAZONE HCL 30 MG TAB: 30 | 30 days supply | Qty: 30 | Fill #0

## 2018-08-25 MED FILL — AMITRIPTYLINE HCL 25 MG TAB: 25 | 30 days supply | Qty: 30 | Fill #5

## 2018-08-25 MED FILL — ROSUVASTATIN CALCIUM 20 MG: 20 | 30 days supply | Qty: 30 | Fill #0

## 2018-08-25 NOTE — Assessment & Plan Note (Signed)
[name] continues to take his metoformin and pioglitazone with no adverse side effects or hypoglycemic readings. He reports no symptoms of neuropathy. Will check his A1c, CMP and urine microalbumin. Influenza vaccination updated today. Encouraged a low carbohydrate intake. Follow up office visit with Dr. Tommy Medal in 3 months or sooner for continued diabetic monitoring.

## 2018-08-25 NOTE — Telephone Encounter (Signed)
error 

## 2018-08-25 NOTE — Assessment & Plan Note (Signed)
[name] has long standing well controlled HIV disease with his current medication regimen of Intelence and Descovy. He is adherent to the medication regimen with no adverse side effects. He has no signs/symptoms of progressive HIV disease or opportunistic infection through history or physical exam. Influenza and Prevnar updated today. Will check routine blood work including RPR, CD4 count, lipid profile and viral load. Continue current dosage of Intelence and Descovy. Plan for follow up office visit in 3 months or sooner if needed with Dr. Tommy [name].

## 2018-09-16 ENCOUNTER — Other Ambulatory Visit: Payer: Self-pay | Admitting: Infectious Diseases

## 2018-09-16 MED FILL — INTELENCE 200 MG TABLET: 200 | 30 days supply | Qty: 60 | Fill #0

## 2018-09-16 MED FILL — DESCOVY 200-25 MG TABS: 200-25 | 30 days supply | Qty: 30 | Fill #1

## 2018-09-16 MED FILL — METOPROLOL SUCCINATE ER 100: 100 | 30 days supply | Qty: 30 | Fill #1

## 2018-09-16 MED FILL — PIOGLITAZONE HCL 30 MG TAB: 30 | 30 days supply | Qty: 30 | Fill #1

## 2018-09-16 MED FILL — ROSUVASTATIN CALCIUM 20 MG: 20 | 30 days supply | Qty: 30 | Fill #1

## 2018-09-16 MED FILL — INTELENCE 200 MG TABLET: 200 | 30 days supply | Qty: 60 | Fill #1

## 2018-10-10 ENCOUNTER — Other Ambulatory Visit: Payer: Self-pay | Admitting: Pharmacist

## 2018-10-10 ENCOUNTER — Other Ambulatory Visit: Payer: Self-pay | Admitting: Family

## 2018-10-10 DIAGNOSIS — E1169 Type 2 diabetes mellitus with other specified complication: Secondary | ICD-10-CM

## 2018-10-10 MED ORDER — METFORMIN HCL 1000 MG PO TABS: 1000.0000 mg | ORAL_TABLET | Freq: Two times a day (BID) | ORAL | 5 refills | Status: DC

## 2018-10-13 ENCOUNTER — Other Ambulatory Visit: Payer: Self-pay | Admitting: Pharmacist

## 2018-10-13 DIAGNOSIS — E1169 Type 2 diabetes mellitus with other specified complication: Secondary | ICD-10-CM

## 2018-10-13 MED ORDER — METFORMIN HCL 1000 MG PO TABS: 1000.0000 mg | ORAL_TABLET | Freq: Two times a day (BID) | ORAL | 5 refills | Status: DC

## 2018-10-13 MED FILL — DESCOVY 200-25 MG TABS: 200-25 | 30 days supply | Qty: 30 | Fill #2

## 2018-10-13 MED FILL — METOPROLOL SUCCINATE ER 100: 100 | 30 days supply | Qty: 30 | Fill #2

## 2018-10-13 MED FILL — metFORMIN HCL 1000 MG TABS: 1000 | 30 days supply | Qty: 60 | Fill #0

## 2018-10-13 MED FILL — INTELENCE 200 MG TABLET: 200 | 30 days supply | Qty: 60 | Fill #2

## 2018-10-13 MED FILL — AMITRIPTYLINE HCL 25 MG TAB: 25 | 30 days supply | Qty: 30 | Fill #1

## 2018-10-13 MED FILL — PIOGLITAZONE HCL 30 MG TAB: 30 | 30 days supply | Qty: 30 | Fill #2

## 2018-10-13 MED FILL — ROSUVASTATIN CALCIUM 20 MG: 20 | 30 days supply | Qty: 30 | Fill #2

## 2018-11-07 ENCOUNTER — Other Ambulatory Visit: Payer: Self-pay | Admitting: Family

## 2018-11-07 ENCOUNTER — Other Ambulatory Visit: Payer: Self-pay | Admitting: *Deleted

## 2018-11-07 DIAGNOSIS — E1169 Type 2 diabetes mellitus with other specified complication: Secondary | ICD-10-CM

## 2018-11-07 DIAGNOSIS — I10 Essential (primary) hypertension: Secondary | ICD-10-CM

## 2018-11-07 MED ORDER — METOPROLOL SUCCINATE ER 100 MG PO TB24: 100.0000 mg | ORAL_TABLET | Freq: Every day | ORAL | 2 refills | Status: DC

## 2018-11-07 MED ORDER — PIOGLITAZONE HCL 30 MG PO TABS: 30.0000 mg | ORAL_TABLET | Freq: Every day | ORAL | 5 refills | Status: DC

## 2018-11-07 MED FILL — AMITRIPTYLINE HCL 25 MG TAB: 25 | 30 days supply | Qty: 30 | Fill #2

## 2018-11-07 MED FILL — INTELENCE 200 MG TABLET: 200 | 30 days supply | Qty: 60 | Fill #3

## 2018-11-07 MED FILL — DESCOVY 200-25 MG TABS: 200-25 | 30 days supply | Qty: 30 | Fill #3

## 2018-11-07 MED FILL — metFORMIN HCL 1000 MG TABS: 1000 | 30 days supply | Qty: 60 | Fill #1

## 2018-11-07 MED FILL — ROSUVASTATIN CALCIUM 20 MG: 20 | 30 days supply | Qty: 30 | Fill #3

## 2018-11-08 ENCOUNTER — Other Ambulatory Visit: Payer: Self-pay | Admitting: Family

## 2018-11-08 DIAGNOSIS — E1169 Type 2 diabetes mellitus with other specified complication: Secondary | ICD-10-CM

## 2018-11-08 DIAGNOSIS — I10 Essential (primary) hypertension: Secondary | ICD-10-CM

## 2018-11-08 MED FILL — PIOGLITAZONE HCL 30 MG TAB: 30 | 30 days supply | Qty: 30 | Fill #0

## 2018-11-08 MED FILL — METOPROLOL SUCCINATE ER 100: 100 | 30 days supply | Qty: 30 | Fill #0

## 2018-12-02 MED FILL — INTELENCE 200 MG TABLET: 200 | 30 days supply | Qty: 60 | Fill #4

## 2018-12-02 MED FILL — DESCOVY 200-25 MG TABS: 200-25 | 30 days supply | Qty: 30 | Fill #4

## 2018-12-02 MED FILL — AMITRIPTYLINE HCL 25 MG TAB: 25 | 30 days supply | Qty: 30 | Fill #3

## 2018-12-02 MED FILL — PIOGLITAZONE HCL 30 MG TAB: 30 | 30 days supply | Qty: 30 | Fill #1

## 2018-12-02 MED FILL — metFORMIN HCL 1000 MG TABS: 1000 | 30 days supply | Qty: 60 | Fill #2

## 2018-12-02 MED FILL — ROSUVASTATIN CALCIUM 20 MG: 20 | 30 days supply | Qty: 30 | Fill #4

## 2018-12-02 MED FILL — METOPROLOL SUCCINATE ER 100: 100 | 30 days supply | Qty: 30 | Fill #1

## 2018-12-29 ENCOUNTER — Other Ambulatory Visit: Payer: Self-pay | Admitting: Family

## 2018-12-29 DIAGNOSIS — B20 Human immunodeficiency virus [HIV] disease: Secondary | ICD-10-CM

## 2019-01-02 MED FILL — METOPROLOL SUCCINATE ER 100: 100 | 30 days supply | Qty: 30 | Fill #2

## 2019-01-02 MED FILL — DESCOVY 200-25 MG TABS: 200-25 | 30 days supply | Qty: 30 | Fill #0

## 2019-01-02 MED FILL — ROSUVASTATIN CALCIUM 20 MG: 20 | 30 days supply | Qty: 30 | Fill #5

## 2019-01-02 MED FILL — PIOGLITAZONE HCL 30 MG TAB: 30 | 30 days supply | Qty: 30 | Fill #2

## 2019-01-02 MED FILL — AMITRIPTYLINE HCL 25 MG TAB: 25 | 30 days supply | Qty: 30 | Fill #4

## 2019-01-02 MED FILL — INTELENCE 200 MG TABLET: 200 | 30 days supply | Qty: 60 | Fill #0

## 2019-01-02 MED FILL — metFORMIN HCL 1000 MG TABS: 1000 | 30 days supply | Qty: 60 | Fill #3

## 2019-01-27 ENCOUNTER — Other Ambulatory Visit: Payer: Self-pay | Admitting: Family

## 2019-01-27 DIAGNOSIS — E781 Pure hyperglyceridemia: Secondary | ICD-10-CM

## 2019-01-27 DIAGNOSIS — B20 Human immunodeficiency virus [HIV] disease: Secondary | ICD-10-CM

## 2019-01-27 DIAGNOSIS — I10 Essential (primary) hypertension: Secondary | ICD-10-CM

## 2019-01-30 ENCOUNTER — Other Ambulatory Visit: Payer: Self-pay

## 2019-01-30 DIAGNOSIS — I10 Essential (primary) hypertension: Secondary | ICD-10-CM

## 2019-01-30 DIAGNOSIS — E669 Obesity, unspecified: Secondary | ICD-10-CM

## 2019-01-30 DIAGNOSIS — G4700 Insomnia, unspecified: Secondary | ICD-10-CM

## 2019-01-30 DIAGNOSIS — E781 Pure hyperglyceridemia: Secondary | ICD-10-CM

## 2019-01-30 DIAGNOSIS — B20 Human immunodeficiency virus [HIV] disease: Secondary | ICD-10-CM

## 2019-01-30 DIAGNOSIS — E1169 Type 2 diabetes mellitus with other specified complication: Secondary | ICD-10-CM

## 2019-01-30 MED ORDER — METOPROLOL SUCCINATE ER 100 MG PO TB24: 100.0000 mg | ORAL_TABLET | Freq: Every day | ORAL | 2 refills | Status: DC

## 2019-01-30 MED ORDER — GLUCOSE BLOOD VI STRP: ORAL_STRIP | 11 refills | Status: AC

## 2019-01-30 MED ORDER — METFORMIN HCL 1000 MG PO TABS: 1000.0000 mg | ORAL_TABLET | Freq: Two times a day (BID) | ORAL | 2 refills | Status: DC

## 2019-01-30 MED ORDER — AMITRIPTYLINE HCL 25 MG PO TABS: 25.0000 mg | ORAL_TABLET | Freq: Every day | ORAL | 2 refills | Status: DC

## 2019-01-30 MED ORDER — ROSUVASTATIN CALCIUM 20 MG PO TABS: ORAL_TABLET | ORAL | 2 refills | Status: DC

## 2019-01-30 MED ORDER — TRAZODONE HCL 50 MG PO TABS: 50.0000 mg | ORAL_TABLET | Freq: Every day | ORAL | 11 refills | Status: DC

## 2019-01-30 MED ORDER — PIOGLITAZONE HCL 30 MG PO TABS: 30.0000 mg | ORAL_TABLET | Freq: Every day | ORAL | 5 refills | Status: DC

## 2019-01-30 MED ORDER — EMTRICITABINE-TENOFOVIR AF 200-25 MG PO TABS: 1.0000 | ORAL_TABLET | Freq: Every day | ORAL | 2 refills | Status: DC

## 2019-01-30 MED ORDER — ETRAVIRINE 200 MG PO TABS: 2.0000 | ORAL_TABLET | Freq: Every day | ORAL | 1 refills | Status: DC

## 2019-01-30 MED FILL — ROSUVASTATIN CALCIUM 20 MG: 20 | 30 days supply | Qty: 30 | Fill #0

## 2019-01-30 MED FILL — AMITRIPTYLINE HCL 25 MG TAB: 25 | 30 days supply | Qty: 30 | Fill #5

## 2019-01-30 MED FILL — ONE TOUCH ULTRA TEST STRIPS: 30 days supply | Qty: 100 | Fill #0

## 2019-01-30 MED FILL — INTELENCE 200 MG TABLET: 200 | 30 days supply | Qty: 60 | Fill #0

## 2019-01-30 MED FILL — metFORMIN HCL 1000 MG TABS: 1000 | 30 days supply | Qty: 60 | Fill #4

## 2019-01-30 MED FILL — traZODone HCL 50 MG TABS: 50 | 30 days supply | Qty: 30 | Fill #0

## 2019-01-30 MED FILL — PIOGLITAZONE HCL 30 MG TAB: 30 | 30 days supply | Qty: 30 | Fill #3

## 2019-01-30 MED FILL — DESCOVY 200-25 MG TABS: 200-25 | 30 days supply | Qty: 30 | Fill #0

## 2019-01-30 MED FILL — METOPROLOL SUCCINATE ER 100: 100 | 30 days supply | Qty: 30 | Fill #0

## 2019-02-24 MED FILL — PIOGLITAZONE HCL 30 MG TAB: 30 | 30 days supply | Qty: 30 | Fill #4

## 2019-02-24 MED FILL — METOPROLOL SUCCINATE ER 100: 100 | 30 days supply | Qty: 30 | Fill #1

## 2019-02-24 MED FILL — AMITRIPTYLINE HCL 25 MG TAB: 25 | 30 days supply | Qty: 30 | Fill #0

## 2019-02-24 MED FILL — DESCOVY 200-25 MG TABS: 200-25 | 30 days supply | Qty: 30 | Fill #1

## 2019-02-24 MED FILL — ROSUVASTATIN CALCIUM 20 MG: 20 | 30 days supply | Qty: 30 | Fill #1

## 2019-03-18 ENCOUNTER — Other Ambulatory Visit: Payer: Self-pay | Admitting: Infectious Disease

## 2019-03-18 DIAGNOSIS — B20 Human immunodeficiency virus [HIV] disease: Secondary | ICD-10-CM

## 2019-03-20 ENCOUNTER — Other Ambulatory Visit: Payer: Self-pay | Admitting: Family

## 2019-03-20 ENCOUNTER — Other Ambulatory Visit: Payer: Self-pay

## 2019-03-20 DIAGNOSIS — B20 Human immunodeficiency virus [HIV] disease: Secondary | ICD-10-CM

## 2019-03-20 MED ORDER — ETRAVIRINE 200 MG PO TABS: 2.0000 | ORAL_TABLET | Freq: Every day | ORAL | 0 refills | Status: DC

## 2019-03-20 MED ORDER — EMTRICITABINE-TENOFOVIR AF 200-25 MG PO TABS: 1.0000 | ORAL_TABLET | Freq: Every day | ORAL | 0 refills | Status: DC

## 2019-03-21 MED FILL — DESCOVY 200-25 MG TABS: 200-25 | 30 days supply | Qty: 30 | Fill #0

## 2019-03-21 MED FILL — AMITRIPTYLINE HCL 25 MG TAB: 25 | 30 days supply | Qty: 30 | Fill #1

## 2019-03-21 MED FILL — INTELENCE 200 MG TABLET: 200 | 30 days supply | Qty: 60 | Fill #1

## 2019-03-21 MED FILL — metFORMIN HCL 1000 MG TABS: 1000 | 30 days supply | Qty: 60 | Fill #5

## 2019-03-21 MED FILL — PIOGLITAZONE HCL 30 MG TAB: 30 | 30 days supply | Qty: 30 | Fill #5

## 2019-03-21 MED FILL — METOPROLOL SUCCINATE ER 100: 100 | 30 days supply | Qty: 30 | Fill #2

## 2019-03-21 MED FILL — ROSUVASTATIN CALCIUM 20 MG: 20 | 30 days supply | Qty: 30 | Fill #2

## 2019-04-06 ENCOUNTER — Ambulatory Visit: Payer: Managed Care, Other (non HMO) | Admitting: Infectious Disease

## 2019-04-08 ENCOUNTER — Other Ambulatory Visit: Payer: Self-pay | Admitting: Infectious Disease

## 2019-04-08 ENCOUNTER — Other Ambulatory Visit: Payer: Self-pay | Admitting: Family

## 2019-04-08 DIAGNOSIS — E1169 Type 2 diabetes mellitus with other specified complication: Secondary | ICD-10-CM

## 2019-04-08 DIAGNOSIS — I10 Essential (primary) hypertension: Secondary | ICD-10-CM

## 2019-04-08 DIAGNOSIS — E781 Pure hyperglyceridemia: Secondary | ICD-10-CM

## 2019-04-13 MED FILL — ROSUVASTATIN CALCIUM 20 MG: 20 | 30 days supply | Qty: 30 | Fill #0

## 2019-04-13 MED FILL — AMITRIPTYLINE HCL 25 MG TAB: 25 | 30 days supply | Qty: 30 | Fill #2

## 2019-04-13 MED FILL — INTELENCE 200 MG TABLET: 200 | 30 days supply | Qty: 60 | Fill #0

## 2019-04-13 MED FILL — METOPROLOL SUCCINATE ER 100: 100 | 30 days supply | Qty: 30 | Fill #0

## 2019-04-13 MED FILL — DESCOVY 200-25 MG TABS: 200-25 | 30 days supply | Qty: 30 | Fill #2

## 2019-04-17 ENCOUNTER — Telehealth: Payer: Self-pay | Admitting: *Deleted

## 2019-04-17 NOTE — Telephone Encounter (Signed)
RN received email from [email or web address] requesting piror authorization for patient's pioglitazone. RN submitted request through website. Landis Gandy, RN

## 2019-04-19 ENCOUNTER — Telehealth: Payer: Self-pay | Admitting: Pharmacy Technician

## 2019-04-19 NOTE — Telephone Encounter (Signed)
[facility] Patient Advocate Encounter   Received notification from [place] that a prior authorization for pioglitazone 15mg  x 2 daily is required. Pioglitazone 30mg  is not availble to the pharmacy now, therefore 2-15mg  is a substitution.  His insurance is [place].   PA submitted on 04/19/2019 [place] [id number] Status is pending    [facility] will continue to follow.   [name]. Nadara Mustard [place] Patient [facility] Phone: [phone] Fax:  [phone]

## 2019-04-20 MED FILL — PIOGLITAZONE HCL 30 MG TABS: 30 | 30 days supply | Qty: 30 | Fill #0

## 2019-05-04 ENCOUNTER — Other Ambulatory Visit: Payer: Self-pay | Admitting: Infectious Disease

## 2019-05-04 ENCOUNTER — Telehealth: Payer: Self-pay | Admitting: Infectious Disease

## 2019-05-04 DIAGNOSIS — B20 Human immunodeficiency virus [HIV] disease: Secondary | ICD-10-CM

## 2019-05-04 NOTE — Telephone Encounter (Signed)
[id number] Pre-Screening Questions: 05/04/19  Do you currently have a fever (>100 F), chills or unexplained body aches?NO  Are you currently experiencing new cough, shortness of breath, sore throat, runny nose? NO   Have you recently travelled outside the state of [place] in the last 14 days? NO   Have you been in contact with someone that is currently pending confirmation of Covid19 testing or has been confirmed to have the [place] virus?  NO   **If the patient answers NO to ALL questions -  advise the patient to please call the clinic before coming to the office should any symptoms develop.

## 2019-05-08 ENCOUNTER — Telehealth: Payer: Self-pay | Admitting: *Deleted

## 2019-05-08 ENCOUNTER — Other Ambulatory Visit: Payer: Self-pay

## 2019-05-08 ENCOUNTER — Ambulatory Visit (INDEPENDENT_AMBULATORY_CARE_PROVIDER_SITE_OTHER): Payer: BLUE CROSS/BLUE SHIELD | Admitting: Infectious Disease

## 2019-05-08 ENCOUNTER — Encounter: Payer: Self-pay | Admitting: Infectious Disease

## 2019-05-08 ENCOUNTER — Other Ambulatory Visit (HOSPITAL_COMMUNITY)
Admission: RE | Admit: 2019-05-08 | Discharge: 2019-05-08 | Disposition: A | Discharge status: Home / Self Care | Payer: BLUE CROSS/BLUE SHIELD | Source: Ambulatory Visit | Attending: Infectious Disease | Admitting: Infectious Disease

## 2019-05-08 VITALS — BP 143/87 | HR 80 | Temp 98.0°F | Ht 72.0 in | Wt 250.0 lb

## 2019-05-08 DIAGNOSIS — I517 Cardiomegaly: Secondary | ICD-10-CM | POA: Diagnosis not present

## 2019-05-08 DIAGNOSIS — E1169 Type 2 diabetes mellitus with other specified complication: Secondary | ICD-10-CM | POA: Diagnosis not present

## 2019-05-08 DIAGNOSIS — B20 Human immunodeficiency virus [HIV] disease: Secondary | ICD-10-CM | POA: Insufficient documentation

## 2019-05-08 DIAGNOSIS — E669 Obesity, unspecified: Secondary | ICD-10-CM | POA: Insufficient documentation

## 2019-05-08 HISTORY — DX: Obesity, unspecified: E66.9

## 2019-05-08 MED ORDER — ETRAVIRINE 200 MG PO TABS: 2.0000 | ORAL_TABLET | Freq: Every day | ORAL | 11 refills | Status: DC

## 2019-05-08 MED ORDER — EMTRICITABINE-TENOFOVIR AF 200-25 MG PO TABS: 1.0000 | ORAL_TABLET | Freq: Every day | ORAL | 11 refills | Status: DC

## 2019-05-08 NOTE — Telephone Encounter (Signed)
Patient called back to advise he has done the research and he would like to try Trulicity and have it sent to [facility] long pharmacy for pick up. Advised will let his provider know.

## 2019-05-08 NOTE — Progress Notes (Signed)
Chief complaint: Follow-up for HIV diabetes mellitus hypertension hyperlipidemia and also co of insominia Subjective:    Patient ID: Ricardo [name], male    DOB: 10/07/1961, 58 y.o.   MRN: [id number]  HPI  [name] with HIV who had been on  reyataz, norvir and truvada with perfect virological suppression and whom we changed to Tivicay and Truvada in December 2013.   On recheck of his viral load was undetectable CD4 count was quite high above 700. He then was concerned that the Tivicay had caused elevated TG.   . Note his triglycerides have been 463 in May of 2014 he was RE on TIVICAY and Truvada. He also began to monitor his blood sugar now and noticed blood sugars are running anywhere between 180-2 200 or 400.   NOTE HE HAD STOPPED METFORMIN AND GLIPIZIDE DUE TO WEIGHT LOSS AT THAT TIME AND A1C WAS AT 6.  In the interim he began reading about TIVICAY and also the tricyclic  antidepressant that would place him on amitriptyline and found mention of elevated blood sugars with both of these drugs. There is mention of other blood sugars and 5% of individuals taking TIVICAY also mention of this with amitriptyline.  We then reviewed various other antiretroviral regimens to change him to any and opted for400 milligrams of Intelence once daily with Truvada.   I suspected on closer review of his chart that his blood sugars really or higher due to a combination of his weight gain his stopping metformin and his stopping glipizide.  He got back on metformin and was being seen by Dr. Loanne [name] who started pioglitazone. Pt incidentally HAd NEVER GONE BACK TO BID METFORMIN ANd was only on 1g daily when he saw Dr. Loanne [name].  I last saw him this fall and his virus remained nicely suppressed.   His HIV remains well controlled though this is NOT the case with his DM      Past Medical History:  Diagnosis Date  . Abnormal liver function tests 11/16/2016  . Diabetes mellitus type 2 in obese ([place])  11/20/2015  . Diabetes mellitus without complication ([place])   . Diabetes type 2, uncontrolled ([place])   . HIV (human immunodeficiency virus infection) ([place])   . Hyperlipidemia   . LVH (left ventricular hypertrophy)   . Palpitations   . Rash and nonspecific skin eruption 09/15/2016    No past surgical history on file.  Family History  Problem Relation Age of Onset  . Coronary artery disease Father 26      Social History   Socioeconomic History  . Marital status: Married    Spouse name: Not on file  . Number of children: Not on file  . Years of education: Not on file  . Highest education level: Not on file  Occupational History  . Not on file  Social Needs  . Financial resource strain: Not on file  . Food insecurity:    Worry: Not on file    Inability: Not on file  . Transportation needs:    Medical: Not on file    Non-medical: Not on file  Tobacco Use  . Smoking status: Never Smoker  . Smokeless tobacco: Never Used  Substance and Sexual Activity  . Alcohol use: No  . Drug use: No  . Sexual activity: Yes    Partners: Female    Comment: pt. declined condoms  Lifestyle  . Physical activity:    Days per week: Not on file    Minutes  per session: Not on file  . Stress: Not on file  Relationships  . Social connections:    Talks on phone: Not on file    Gets together: Not on file    Attends religious service: Not on file    Active member of club or organization: Not on file    Attends meetings of clubs or organizations: Not on file    Relationship status: Not on file  Other Topics Concern  . Not on file  Social History Narrative  . Not on file    Allergies  Allergen Reactions  . Amoxicillin-Pot Clavulanate      Current Outpatient Medications:  .  amitriptyline (ELAVIL) 25 MG tablet, TAKE 1 TABLET (25 MG TOTAL) BY MOUTH AT BEDTIME., Disp: 30 tablet, Rfl: 0 .  aspirin 81 MG tablet, Take 81 mg by mouth daily.  , Disp: , Rfl:  .  Blood Glucose Monitoring Suppl  (ONE TOUCH ULTRA 2) W/DEVICE KIT, Use to check blood sugars 1/day., Disp: 1 each, Rfl: 0 .  DESCOVY 200-25 MG tablet, TAKE 1 TABLET BY MOUTH DAILY., Disp: 30 tablet, Rfl: 0 .  Etravirine (INTELENCE) 200 MG TABS, Take 2 tablets (400 mg total) by mouth daily., Disp: 60 tablet, Rfl: 0 .  glucose blood (ONE TOUCH ULTRA TEST) test strip, Use to check blood sugars three times daily., Disp: 100 each, Rfl: 11 .  metFORMIN (GLUCOPHAGE) 1000 MG tablet, Take 1 tablet (1,000 mg total) by mouth 2 (two) times daily with a meal., Disp: 60 tablet, Rfl: 2 .  metoprolol succinate (TOPROL-XL) 100 MG 24 hr tablet, TAKE 1 TABLET (100 MG TOTAL) BY MOUTH DAILY. TAKE WITH OR IMMEDIATELY FOLLOWING A MEAL., Disp: 30 tablet, Rfl: 2 .  pioglitazone (ACTOS) 30 MG tablet, TAKE 1 TABLET BY MOUTH DAILY, Disp: 30 tablet, Rfl: 5 .  rosuvastatin (CRESTOR) 20 MG tablet, TAKE 1 TABLET(20 MG) BY MOUTH DAILY, Disp: 30 tablet, Rfl: 2 .  traZODone (DESYREL) 50 MG tablet, Take 1 tablet (50 mg total) by mouth at bedtime., Disp: 30 tablet, Rfl: 11     Review of Systems  Constitutional: Negative for activity change, appetite change, chills, diaphoresis, fatigue, fever and unexpected weight change.  HENT: Negative for congestion, rhinorrhea, sinus pressure, sneezing, sore throat and trouble swallowing.   Eyes: Negative for photophobia and visual disturbance.  Respiratory: Negative for cough, chest tightness, shortness of breath, wheezing and stridor.   Cardiovascular: Negative for chest pain, palpitations and leg swelling.  Gastrointestinal: Positive for diarrhea. Negative for abdominal distention, abdominal pain, anal bleeding, blood in stool, constipation, nausea and vomiting.  Genitourinary: Negative for difficulty urinating, dysuria, flank pain and hematuria.  Musculoskeletal: Negative for arthralgias, back pain, gait problem, joint swelling and myalgias.  Skin: Negative for color change, pallor and wound.  Neurological: Negative for  dizziness, tremors, weakness and light-headedness.  Hematological: Negative for adenopathy. Does not bruise/bleed easily.  Psychiatric/Behavioral: Negative for agitation, behavioral problems, confusion, decreased concentration and dysphoric mood.       Objective:   Physical Exam  Constitutional: He is oriented to person, place, and time. He appears well-developed and well-nourished.  HENT:  Head: Normocephalic and atraumatic.  Eyes: Conjunctivae and EOM are normal.  Neck: Normal range of motion. Neck supple.  Cardiovascular: Normal rate and regular rhythm.  Pulmonary/Chest: Effort normal. No respiratory distress. He has no wheezes.  Abdominal: Soft. He exhibits no distension.  Musculoskeletal: Normal range of motion.        General: No tenderness or  edema.  Neurological: He is alert and oriented to person, place, and time.  Skin: Skin is warm and dry. No erythema. No pallor.  Psychiatric: He has a normal mood and affect. His behavior is normal. Judgment and thought content normal.  Nursing note and vitals reviewed.         Assessment & Plan:  HIV: continue  Intelence and  Descovy for better bone and kidney safety. Could go to [place] and Descovy down the road   DM: Will check an A1c He was refusing to see PCP or Endocrine claming it is too expensive  I asked Terri Piedra NP who practiced in [place] PCP prior to joining our group and in addition to weight loss through carbohydrate restricted diet that pt could try one of the following drugs in combination with his pioglitazone       Hyperlipidemia: . Continue statin and check lipids   Insomnia: continue trazadone  LVH: continue beta blocker, the blood pressure remains in the range of this today he may need a second agent such as an ACEI   Vitals:   05/08/19 1013  BP: (!) 143/87  Pulse: 80  Temp: 98 F (36.7 C)     Obesity: needs to lose weight. Recommended carbohydrate restriction to less than 100 grams per day   I spent greater than 25 minutes with the patient including greater than 50% of time in face to face counsel of the patient re need to lose weight, to control his DM with medications continuing his ARV  his and in coordination of his care.

## 2019-05-09 ENCOUNTER — Other Ambulatory Visit: Payer: Self-pay | Admitting: Infectious Disease

## 2019-05-09 LAB — URINE CYTOLOGY ANCILLARY ONLY
Chlamydia: NEGATIVE
Neisseria Gonorrhea: NEGATIVE

## 2019-05-09 LAB — T-HELPER CELL (CD4) - (RCID CLINIC ONLY)
CD4 % Helper T Cell: 31 % — ABNORMAL LOW (ref 33–65)
CD4 T Cell Abs: 708 /uL (ref 400–1790)

## 2019-05-09 MED ORDER — DULAGLUTIDE 0.75 MG/0.5ML ~~LOC~~ SOAJ: 0.7500 mg | SUBCUTANEOUS | 5 refills | Status: DC

## 2019-05-09 NOTE — Telephone Encounter (Signed)
Called patient back to advise Ricardo [name] has sent the medication to the pharmacy and should be ready for pick up or delivery. Also to urge to get a PCP to follow him long term. Had to leave a message for him to contact the office as he did not answer.

## 2019-05-09 NOTE — Telephone Encounter (Signed)
I will write for starting dose.  Going forward if he wants tinkering with these typesof drugs in our clnic I would prefer him to see Marya Amsler. As has been mentioned many many times he should and can see Endocrine and PCP

## 2019-05-10 MED FILL — ROSUVASTATIN CALCIUM 20 MG: 20 | 30 days supply | Qty: 30 | Fill #1

## 2019-05-10 MED FILL — DESCOVY 200-25 MG TABS: 200-25 | 30 days supply | Qty: 30 | Fill #0

## 2019-05-10 MED FILL — INTELENCE 200 MG TABLET: 200 | 30 days supply | Qty: 60 | Fill #0

## 2019-05-10 MED FILL — METOPROLOL SUCCINATE ER 100: 100 | 30 days supply | Qty: 30 | Fill #1

## 2019-05-10 MED FILL — TRULICITY 0.75 MG/0.5 ML PE: 0.75 | 28 days supply | Qty: 2 | Fill #0

## 2019-05-10 MED FILL — metFORMIN HCL 1000 MG TABS: 1000 | 30 days supply | Qty: 60 | Fill #0

## 2019-05-10 MED FILL — AMITRIPTYLINE HCL 25 MG TAB: 25 | 30 days supply | Qty: 30 | Fill #0

## 2019-05-10 MED FILL — PIOGLITAZONE HCL 30 MG TABS: 30 | 30 days supply | Qty: 30 | Fill #1

## 2019-05-13 LAB — COMPLETE METABOLIC PANEL WITH GFR
AG Ratio: 1.5 (calc) (ref 1.0–2.5)
ALT: 24 U/L (ref 9–46)
AST: 22 U/L (ref 10–35)
Albumin: 4.4 g/dL (ref 3.6–5.1)
Alkaline phosphatase (APISO): 82 U/L (ref 35–144)
BUN: 19 mg/dL (ref 7–25)
CO2: 26 mmol/L (ref 20–32)
Calcium: 9.4 mg/dL (ref 8.6–10.3)
Chloride: 104 mmol/L (ref 98–110)
Creat: 0.8 mg/dL (ref 0.70–1.33)
GFR, Est African American: 114 mL/min/{1.73_m2} (ref >=60)
GFR, Est Non African American: 98 mL/min/{1.73_m2} (ref >=60)
Globulin: 2.9 g/dL (calc) (ref 1.9–3.7)
Glucose, Bld: 308 mg/dL — ABNORMAL HIGH (ref 65–99)
Potassium: 4.3 mmol/L (ref 3.5–5.3)
Sodium: 136 mmol/L (ref 135–146)
Total Bilirubin: 0.3 mg/dL (ref 0.2–1.2)
Total Protein: 7.3 g/dL (ref 6.1–8.1)

## 2019-05-13 LAB — CBC WITH DIFFERENTIAL/PLATELET
Absolute Monocytes: 369 cells/uL (ref 200–950)
Basophils Absolute: 72 cells/uL (ref 0–200)
Basophils Relative: 1.6 %
Eosinophils Absolute: 108 cells/uL (ref 15–500)
Eosinophils Relative: 2.4 %
HCT: 44.2 % (ref 38.5–50.0)
Hemoglobin: 14.9 g/dL (ref 13.2–17.1)
Lymphs Abs: 2358 cells/uL (ref 850–3900)
MCH: 31.2 pg (ref 27.0–33.0)
MCHC: 33.7 g/dL (ref 32.0–36.0)
MCV: 92.7 fL (ref 80.0–100.0)
MPV: 10.3 fL (ref 7.5–12.5)
Monocytes Relative: 8.2 %
Neutro Abs: 1593 cells/uL (ref 1500–7800)
Neutrophils Relative %: 35.4 %
Platelets: 245 10*3/uL (ref 140–400)
RBC: 4.77 10*6/uL (ref 4.20–5.80)
RDW: 13.1 % (ref 11.0–15.0)
Total Lymphocyte: 52.4 %
WBC: 4.5 10*3/uL (ref 3.8–10.8)

## 2019-05-13 LAB — LIPID PANEL
Cholesterol: 193 mg/dL (ref <200)
HDL: 26 mg/dL — ABNORMAL LOW (ref >=40)
Non-HDL Cholesterol (Calc): 167 mg/dL (calc) — ABNORMAL HIGH (ref <130)
Total CHOL/HDL Ratio: 7.4 (calc) — ABNORMAL HIGH (ref <5.0)
Triglycerides: 864 mg/dL — ABNORMAL HIGH (ref <150)

## 2019-05-13 LAB — HIV-1 RNA QUANT-NO REFLEX-BLD
HIV 1 RNA Quant: 66 copies/mL — ABNORMAL HIGH
HIV-1 RNA Quant, Log: 1.82 Log copies/mL — ABNORMAL HIGH

## 2019-05-13 LAB — HEMOGLOBIN A1C
Hgb A1c MFr Bld: 9.5 % of total Hgb — ABNORMAL HIGH (ref <5.7)
Mean Plasma Glucose: 226 (calc)
eAG (mmol/L): 12.5 (calc)

## 2019-05-13 LAB — RPR: RPR Ser Ql: NONREACTIVE

## 2019-05-24 ENCOUNTER — Ambulatory Visit (INDEPENDENT_AMBULATORY_CARE_PROVIDER_SITE_OTHER): Payer: BC Managed Care – PPO | Admitting: Infectious Disease

## 2019-05-24 ENCOUNTER — Other Ambulatory Visit: Payer: Self-pay

## 2019-05-24 DIAGNOSIS — E1169 Type 2 diabetes mellitus with other specified complication: Secondary | ICD-10-CM | POA: Diagnosis not present

## 2019-05-24 DIAGNOSIS — B20 Human immunodeficiency virus [HIV] disease: Secondary | ICD-10-CM

## 2019-05-24 DIAGNOSIS — I517 Cardiomegaly: Secondary | ICD-10-CM

## 2019-05-24 DIAGNOSIS — E781 Pure hyperglyceridemia: Secondary | ICD-10-CM | POA: Diagnosis not present

## 2019-05-24 DIAGNOSIS — I10 Essential (primary) hypertension: Secondary | ICD-10-CM

## 2019-05-24 NOTE — Progress Notes (Signed)
Virtual Visit via Telephone Note  I connected with Ricardo [name] on 05/24/19 at  2:45 PM EDT by telephone and verified that I am speaking with the correct person using two identifiers.  Location: Patient: Home Provider: [facility]   I discussed the limitations, risks, security and privacy concerns of performing an evaluation and management service by telephone and the availability of in person appointments. I also discussed with the patient that there may be a patient responsible charge related to this service. The patient expressed understanding and agreed to proceed.   History of Present Illness:   [name] is a 58 year old Caucasian male whose HIV is well controlled but who suffers from metabolic syndrome due to being overweight and having poorly controlled diabetes, hypertension and hyperlipidemia.  He had not been willing to take metformin due to diarrhea we finally had placed him on Trulicity at his last visit.  He has been very difficult to get engaged in with a primary care physician or endocrinologist both of whom he has seen in the past.  I did start him of the Trulicity after reviewing these medications option shins with our nurse practitioner Terri Piedra.  Trav[place]a seems to be tolerating them pretty well.  I have also emphasized that he really does need to lose weight through carbohydrate restricted diet which would include both his A1c and his hypertension.  His HIV is relatively well controlled with a viral load only in the 60s which I think is likely a blip.  He is happy with his current antiretroviral regimen.     Observations/Objective:  HIV disease  Poorly controlled diabetes mellitus  Hypertension  Obesity  Hyperlipidemia  Assessment and Plan:  HIV disease continue current regimen consider switch to Doravirine and DESCOVY in the future  DM: Continue Trulicity continue efforts at weight loss.  May have to increase the Trulicity  HTN: Continue  metoprolol and continue efforts at weight loss  Obesity: I have asked him to try a low carbohydrate diet to lose weight effectively while monitoring his blood sugars.  Follow Up Instructions:    I discussed the assessment and treatment plan with the patient. The patient was provided an opportunity to ask questions and all were answered. The patient agreed with the plan and demonstrated an understanding of the instructions.   The patient was advised to call back or seek an in-person evaluation if the symptoms worsen or if the condition fails to improve as anticipated.  I provided 30 minutes of non-face-to-face time during this encounter.   Alcide Evener, MD

## 2019-06-01 ENCOUNTER — Other Ambulatory Visit: Payer: Self-pay | Admitting: Infectious Disease

## 2019-06-01 DIAGNOSIS — B20 Human immunodeficiency virus [HIV] disease: Secondary | ICD-10-CM

## 2019-06-05 MED FILL — ROSUVASTATIN CALCIUM 20 MG: 20 | 30 days supply | Qty: 30 | Fill #2

## 2019-06-05 MED FILL — TRULICITY 0.75 MG/0.5 ML PE: 0.75 | 28 days supply | Qty: 2 | Fill #1

## 2019-06-05 MED FILL — INTELENCE 200 MG TABLET: 200 | 30 days supply | Qty: 60 | Fill #0

## 2019-06-05 MED FILL — METOPROLOL SUCCINATE ER 100: 100 | 30 days supply | Qty: 30 | Fill #2

## 2019-06-05 MED FILL — metFORMIN HCL 1000 MG TABS: 1000 | 30 days supply | Qty: 60 | Fill #1

## 2019-06-05 MED FILL — AMITRIPTYLINE HCL 25 MG TAB: 25 | 30 days supply | Qty: 30 | Fill #0

## 2019-06-05 MED FILL — PIOGLITAZONE HCL 30 MG TABS: 30 | 30 days supply | Qty: 30 | Fill #2

## 2019-06-05 MED FILL — DESCOVY 200-25 MG TABS: 200-25 | 30 days supply | Qty: 30 | Fill #0

## 2019-07-06 MED FILL — AMITRIPTYLINE HCL 25 MG TAB: 25 | 30 days supply | Qty: 30 | Fill #1

## 2019-07-06 MED FILL — TRULICITY 0.75 MG/0.5 ML PE: 0.75 | 28 days supply | Qty: 2 | Fill #2

## 2019-07-06 MED FILL — PIOGLITAZONE HCL 30 MG TABS: 30 | 30 days supply | Qty: 30 | Fill #3

## 2019-07-06 MED FILL — DESCOVY 200-25 MG TABS: 200-25 | 30 days supply | Qty: 30 | Fill #1

## 2019-07-06 MED FILL — INTELENCE 200 MG TABLET: 200 | 30 days supply | Qty: 60 | Fill #1

## 2019-07-14 DIAGNOSIS — Z6835 Body mass index (BMI) 35.0-35.9, adult: Secondary | ICD-10-CM | POA: Diagnosis not present

## 2019-07-14 DIAGNOSIS — E669 Obesity, unspecified: Secondary | ICD-10-CM | POA: Diagnosis not present

## 2019-07-14 DIAGNOSIS — R109 Unspecified abdominal pain: Secondary | ICD-10-CM | POA: Diagnosis not present

## 2019-07-31 ENCOUNTER — Other Ambulatory Visit: Payer: Self-pay | Admitting: Infectious Disease

## 2019-07-31 DIAGNOSIS — E781 Pure hyperglyceridemia: Secondary | ICD-10-CM

## 2019-07-31 DIAGNOSIS — I10 Essential (primary) hypertension: Secondary | ICD-10-CM

## 2019-08-02 MED FILL — TRULICITY 0.75 MG/0.5 ML PE: 0.75 | 28 days supply | Qty: 2 | Fill #3

## 2019-08-02 MED FILL — PIOGLITAZONE HCL 30 MG TABS: 30 | 30 days supply | Qty: 30 | Fill #4

## 2019-08-02 MED FILL — INTELENCE 200 MG TABLET: 200 | 30 days supply | Qty: 60 | Fill #2

## 2019-08-02 MED FILL — METOPROLOL SUCCINATE ER 100: 100 | 30 days supply | Qty: 30 | Fill #0

## 2019-08-02 MED FILL — DESCOVY 200-25 MG TABS: 200-25 | 30 days supply | Qty: 30 | Fill #2

## 2019-08-02 MED FILL — AMITRIPTYLINE HCL 25 MG TAB: 25 | 30 days supply | Qty: 30 | Fill #2

## 2019-08-02 MED FILL — ROSUVASTATIN CALCIUM 20 MG: 20 | 30 days supply | Qty: 30 | Fill #0

## 2019-08-29 MED FILL — PIOGLITAZONE HCL 30 MG TABS: 30 | 30 days supply | Qty: 30 | Fill #5

## 2019-08-29 MED FILL — METOPROLOL SUCCINATE ER 100: 100 | 30 days supply | Qty: 30 | Fill #1

## 2019-08-29 MED FILL — DESCOVY 200-25 MG TABS: 200-25 | 30 days supply | Qty: 30 | Fill #3

## 2019-08-29 MED FILL — ROSUVASTATIN CALCIUM 20 MG: 20 | 30 days supply | Qty: 30 | Fill #1

## 2019-08-29 MED FILL — TRULICITY 0.75 MG/0.5 ML PE: 0.75 | 28 days supply | Qty: 2 | Fill #4

## 2019-08-29 MED FILL — INTELENCE 200 MG TABLET: 200 | 30 days supply | Qty: 60 | Fill #3

## 2019-08-29 MED FILL — AMITRIPTYLINE HCL 25 MG TAB: 25 | 30 days supply | Qty: 30 | Fill #3

## 2019-09-06 DIAGNOSIS — R109 Unspecified abdominal pain: Secondary | ICD-10-CM | POA: Diagnosis not present

## 2019-09-06 DIAGNOSIS — M48061 Spinal stenosis, lumbar region without neurogenic claudication: Secondary | ICD-10-CM | POA: Diagnosis not present

## 2019-09-25 ENCOUNTER — Other Ambulatory Visit: Payer: Self-pay | Admitting: Infectious Disease

## 2019-09-25 DIAGNOSIS — E1169 Type 2 diabetes mellitus with other specified complication: Secondary | ICD-10-CM

## 2019-09-27 MED FILL — DESCOVY 200-25 MG TABS: 200-25 | 30 days supply | Qty: 30 | Fill #4

## 2019-09-27 MED FILL — METOPROLOL SUCCINATE ER 100: 100 | 30 days supply | Qty: 30 | Fill #2

## 2019-09-27 MED FILL — AMITRIPTYLINE HCL 25 MG TAB: 25 | 30 days supply | Qty: 30 | Fill #4

## 2019-09-27 MED FILL — ROSUVASTATIN CALCIUM 20 MG: 20 | 30 days supply | Qty: 30 | Fill #2

## 2019-09-27 MED FILL — PIOGLITAZONE HCL 30 MG TABS: 30 | 30 days supply | Qty: 30 | Fill #0

## 2019-09-27 MED FILL — TRULICITY 0.75 MG/0.5 ML PE: 0.75 | 28 days supply | Qty: 2 | Fill #5

## 2019-09-27 MED FILL — INTELENCE 200 MG TABLET: 200 | 30 days supply | Qty: 60 | Fill #4

## 2019-10-20 ENCOUNTER — Telehealth: Payer: Self-pay | Admitting: *Deleted

## 2019-10-20 ENCOUNTER — Other Ambulatory Visit: Payer: Self-pay | Admitting: Infectious Disease

## 2019-10-20 NOTE — Telephone Encounter (Signed)
Patient requesting refill of trulicity.  Per Dr Derek Mound notes, this was a one time prescription with 5 refills in order for him to follow up with PCP or endocrinology for future management. RN reminded patient of this agreement.  Patient irritated, but will contact his PCP Ms. Hudnell for an appointment.  RN gave patient 1 month refill.  Landis Gandy, RN

## 2019-10-23 DIAGNOSIS — I10 Essential (primary) hypertension: Secondary | ICD-10-CM | POA: Diagnosis not present

## 2019-10-23 DIAGNOSIS — E669 Obesity, unspecified: Secondary | ICD-10-CM | POA: Diagnosis not present

## 2019-10-23 DIAGNOSIS — E1169 Type 2 diabetes mellitus with other specified complication: Secondary | ICD-10-CM | POA: Diagnosis not present

## 2019-10-23 DIAGNOSIS — B20 Human immunodeficiency virus [HIV] disease: Secondary | ICD-10-CM | POA: Diagnosis not present

## 2019-10-23 MED FILL — FARXIGA 5 MG TABLET: 5 | 90 days supply | Qty: 90 | Fill #0

## 2019-10-23 NOTE — Telephone Encounter (Signed)
Thanks Michelle

## 2019-10-24 MED FILL — AMITRIPTYLINE HCL 25 MG TAB: 25 | 30 days supply | Qty: 30 | Fill #5

## 2019-10-24 MED FILL — DESCOVY 200-25 MG TABS: 200-25 | 30 days supply | Qty: 30 | Fill #5

## 2019-10-24 MED FILL — METOPROLOL SUCCINATE ER 100: 100 | 30 days supply | Qty: 30 | Fill #3

## 2019-10-24 MED FILL — INTELENCE 200 MG TABLET: 200 | 30 days supply | Qty: 60 | Fill #5

## 2019-10-24 MED FILL — PIOGLITAZONE HCL 30 MG TABS: 30 | 30 days supply | Qty: 30 | Fill #1

## 2019-10-24 MED FILL — TRULICITY 0.75 MG/0.5 ML PE: 0.75 | 28 days supply | Qty: 2 | Fill #0

## 2019-10-24 MED FILL — ROSUVASTATIN CALCIUM 20 MG: 20 | 30 days supply | Qty: 30 | Fill #3

## 2019-10-25 MED FILL — FENOFIBRATE 145 MG TABS: 145 | 90 days supply | Qty: 90 | Fill #0

## 2019-10-25 MED FILL — glipiZIDE ER 5 MG TB24: 5 | 90 days supply | Qty: 90 | Fill #0

## 2019-10-27 MED FILL — FARXIGA 5 MG TABLET: 5 | 90 days supply | Qty: 90 | Fill #0

## 2019-10-30 MED FILL — TRULICITY 1.5 MG/0.5 ML PEN: 1.5 | 28 days supply | Qty: 2 | Fill #0

## 2019-11-20 ENCOUNTER — Other Ambulatory Visit: Payer: Self-pay | Admitting: Infectious Disease

## 2019-11-20 DIAGNOSIS — B20 Human immunodeficiency virus [HIV] disease: Secondary | ICD-10-CM

## 2019-11-22 MED FILL — TRULICITY 1.5 MG/0.5 ML PEN: 1.5 | 28 days supply | Qty: 2 | Fill #1

## 2019-11-22 MED FILL — ROSUVASTATIN CALCIUM 20 MG: 20 | 30 days supply | Qty: 30 | Fill #4

## 2019-11-22 MED FILL — METOPROLOL SUCCINATE ER 100: 100 | 30 days supply | Qty: 30 | Fill #4

## 2019-11-22 MED FILL — AMITRIPTYLINE HCL 25 MG TAB: 25 | 30 days supply | Qty: 30 | Fill #0

## 2019-11-22 MED FILL — INTELENCE 200 MG TABLET: 200 | 30 days supply | Qty: 60 | Fill #0

## 2019-11-22 MED FILL — DESCOVY 200-25 MG TABS: 200-25 | 30 days supply | Qty: 30 | Fill #0

## 2019-11-22 MED FILL — PIOGLITAZONE HCL 30 MG TABS: 30 | 30 days supply | Qty: 30 | Fill #2

## 2019-11-23 ENCOUNTER — Ambulatory Visit: Payer: BC Managed Care – PPO | Admitting: Infectious Disease

## 2019-12-20 MED FILL — AMITRIPTYLINE HCL 25 MG TAB: 25 | 30 days supply | Qty: 30 | Fill #1

## 2019-12-20 MED FILL — METOPROLOL SUCCINATE ER 100: 100 | 30 days supply | Qty: 30 | Fill #5

## 2019-12-20 MED FILL — ROSUVASTATIN CALCIUM 20 MG: 20 | 30 days supply | Qty: 30 | Fill #5

## 2019-12-20 MED FILL — TRULICITY 1.5 MG/0.5 ML PEN: 1.5 | 28 days supply | Qty: 2 | Fill #2

## 2019-12-20 MED FILL — DESCOVY 200-25 MG TABS: 200-25 | 30 days supply | Qty: 30 | Fill #1

## 2019-12-20 MED FILL — PIOGLITAZONE HCL 30 MG TAB: 30 | 30 days supply | Qty: 30 | Fill #3

## 2019-12-20 MED FILL — INTELENCE 200 MG TABLET: 200 | 30 days supply | Qty: 60 | Fill #1

## 2020-01-15 ENCOUNTER — Other Ambulatory Visit: Payer: Self-pay | Admitting: Infectious Disease

## 2020-01-15 DIAGNOSIS — E781 Pure hyperglyceridemia: Secondary | ICD-10-CM

## 2020-01-15 DIAGNOSIS — I10 Essential (primary) hypertension: Secondary | ICD-10-CM

## 2020-01-15 MED FILL — METOPROLOL SUCCINATE ER 100: 100 | 30 days supply | Qty: 30 | Fill #0

## 2020-01-18 MED FILL — AMITRIPTYLINE HCL 25 MG TAB: 25 | 30 days supply | Qty: 30 | Fill #2

## 2020-01-18 MED FILL — INTELENCE 200 MG TABLET: 200 | 30 days supply | Qty: 60 | Fill #2

## 2020-01-18 MED FILL — TRULICITY 1.5 MG/0.5 ML PEN: 1.5 | 28 days supply | Qty: 2 | Fill #3

## 2020-01-18 MED FILL — DESCOVY 200-25 MG TABS: 200-25 | 30 days supply | Qty: 30 | Fill #2

## 2020-02-12 ENCOUNTER — Telehealth: Payer: Self-pay

## 2020-02-12 ENCOUNTER — Other Ambulatory Visit: Payer: Self-pay | Admitting: Infectious Disease

## 2020-02-12 DIAGNOSIS — I10 Essential (primary) hypertension: Secondary | ICD-10-CM

## 2020-02-12 NOTE — Telephone Encounter (Signed)
Called patient to schedule overdue appointment with office. Patient is [place] of [place] and is not back in town until April. Scheduled for labs on 4/8; will schedule office visit during that appointment.  [place]

## 2020-02-15 MED FILL — DESCOVY 200-25 MG TABS: 200-25 | 30 days supply | Qty: 30 | Fill #3

## 2020-02-15 MED FILL — FARXIGA 5 MG TABLET: 5 | 90 days supply | Qty: 90 | Fill #1

## 2020-02-15 MED FILL — INTELENCE 200 MG TABLET: 200 | 30 days supply | Qty: 60 | Fill #3

## 2020-02-15 MED FILL — METOPROLOL SUCCINATE ER 100: 100 | 30 days supply | Qty: 30 | Fill #0

## 2020-02-15 MED FILL — ROSUVASTATIN CALCIUM 20 MG: 20 | 30 days supply | Qty: 30 | Fill #0

## 2020-02-15 MED FILL — AMITRIPTYLINE HCL 25 MG TAB: 25 | 30 days supply | Qty: 30 | Fill #3

## 2020-02-15 MED FILL — TRULICITY 1.5 MG/0.5 ML PEN: 1.5 | 28 days supply | Qty: 2 | Fill #0

## 2020-03-11 ENCOUNTER — Other Ambulatory Visit: Payer: Self-pay | Admitting: Infectious Disease

## 2020-03-11 DIAGNOSIS — I10 Essential (primary) hypertension: Secondary | ICD-10-CM

## 2020-03-11 DIAGNOSIS — E781 Pure hyperglyceridemia: Secondary | ICD-10-CM

## 2020-03-11 MED FILL — METOPROLOL SUCCINATE ER 100: 100 | 30 days supply | Qty: 30 | Fill #0

## 2020-03-11 MED FILL — ROSUVASTATIN CALCIUM 20 MG: 20 | 30 days supply | Qty: 30 | Fill #0

## 2020-03-13 MED FILL — AMITRIPTYLINE HCL 25 MG TAB: 25 | 30 days supply | Qty: 30 | Fill #4

## 2020-03-13 MED FILL — INTELENCE 200 MG TABLET: 200 | 30 days supply | Qty: 60 | Fill #4

## 2020-03-13 MED FILL — DESCOVY 200-25 MG TABS: 200-25 | 30 days supply | Qty: 30 | Fill #4

## 2020-03-26 ENCOUNTER — Other Ambulatory Visit: Payer: Self-pay | Admitting: *Deleted

## 2020-03-26 DIAGNOSIS — B20 Human immunodeficiency virus [HIV] disease: Secondary | ICD-10-CM

## 2020-03-28 ENCOUNTER — Other Ambulatory Visit (HOSPITAL_COMMUNITY)
Admission: RE | Admit: 2020-03-28 | Discharge: 2020-03-28 | Disposition: A | Discharge status: Home / Self Care | Payer: BC Managed Care – PPO | Source: Ambulatory Visit | Attending: Infectious Disease | Admitting: Infectious Disease

## 2020-03-28 ENCOUNTER — Other Ambulatory Visit: Payer: BC Managed Care – PPO

## 2020-03-28 ENCOUNTER — Other Ambulatory Visit: Payer: Self-pay

## 2020-03-28 DIAGNOSIS — Z6834 Body mass index (BMI) 34.0-34.9, adult: Secondary | ICD-10-CM | POA: Diagnosis not present

## 2020-03-28 DIAGNOSIS — B20 Human immunodeficiency virus [HIV] disease: Secondary | ICD-10-CM

## 2020-03-28 DIAGNOSIS — I10 Essential (primary) hypertension: Secondary | ICD-10-CM | POA: Diagnosis not present

## 2020-03-28 DIAGNOSIS — Z79899 Other long term (current) drug therapy: Secondary | ICD-10-CM

## 2020-03-28 DIAGNOSIS — E1169 Type 2 diabetes mellitus with other specified complication: Secondary | ICD-10-CM | POA: Diagnosis not present

## 2020-03-28 DIAGNOSIS — Z113 Encounter for screening for infections with a predominantly sexual mode of transmission: Secondary | ICD-10-CM | POA: Insufficient documentation

## 2020-03-28 NOTE — Addendum Note (Signed)
Addended by: Monia Sabal on: 03/28/2020 09:23 AM   Modules accepted: Orders

## 2020-03-28 NOTE — Addendum Note (Signed)
Addended by: Monia Sabal on: 03/28/2020 09:05 AM   Modules accepted: Orders

## 2020-03-29 LAB — URINE CYTOLOGY ANCILLARY ONLY
Chlamydia: NEGATIVE
Comment: NEGATIVE
Comment: NORMAL
Neisseria Gonorrhea: NEGATIVE

## 2020-03-29 LAB — T-HELPER CELL (CD4) - (RCID CLINIC ONLY)
CD4 % Helper T Cell: 31 % — ABNORMAL LOW (ref 33–65)
CD4 T Cell Abs: 699 /uL (ref 400–1790)

## 2020-03-30 LAB — COMPREHENSIVE METABOLIC PANEL
AG Ratio: 1.4 (calc) (ref 1.0–2.5)
ALT: 31 U/L (ref 9–46)
AST: 31 U/L (ref 10–35)
Albumin: 4.2 g/dL (ref 3.6–5.1)
Alkaline phosphatase (APISO): 62 U/L (ref 35–144)
BUN: 16 mg/dL (ref 7–25)
CO2: 25 mmol/L (ref 20–32)
Calcium: 9.5 mg/dL (ref 8.6–10.3)
Chloride: 105 mmol/L (ref 98–110)
Creat: 0.97 mg/dL (ref 0.70–1.33)
Globulin: 3 g/dL (calc) (ref 1.9–3.7)
Glucose, Bld: 192 mg/dL — ABNORMAL HIGH (ref 65–99)
Potassium: 4.2 mmol/L (ref 3.5–5.3)
Sodium: 137 mmol/L (ref 135–146)
Total Bilirubin: 0.4 mg/dL (ref 0.2–1.2)
Total Protein: 7.2 g/dL (ref 6.1–8.1)

## 2020-03-30 LAB — CBC WITH DIFFERENTIAL/PLATELET
Absolute Monocytes: 377 cells/uL (ref 200–950)
Basophils Absolute: 51 cells/uL (ref 0–200)
Basophils Relative: 1.1 %
Eosinophils Absolute: 101 cells/uL (ref 15–500)
Eosinophils Relative: 2.2 %
HCT: 43.7 % (ref 38.5–50.0)
Hemoglobin: 15 g/dL (ref 13.2–17.1)
Lymphs Abs: 2065 cells/uL (ref 850–3900)
MCH: 31.7 pg (ref 27.0–33.0)
MCHC: 34.3 g/dL (ref 32.0–36.0)
MCV: 92.4 fL (ref 80.0–100.0)
MPV: 9.9 fL (ref 7.5–12.5)
Monocytes Relative: 8.2 %
Neutro Abs: 2006 cells/uL (ref 1500–7800)
Neutrophils Relative %: 43.6 %
Platelets: 231 10*3/uL (ref 140–400)
RBC: 4.73 10*6/uL (ref 4.20–5.80)
RDW: 12.9 % (ref 11.0–15.0)
Total Lymphocyte: 44.9 %
WBC: 4.6 10*3/uL (ref 3.8–10.8)

## 2020-03-30 LAB — RPR: RPR Ser Ql: NONREACTIVE

## 2020-03-30 LAB — LIPID PANEL
Cholesterol: 165 mg/dL (ref <200)
HDL: 32 mg/dL — ABNORMAL LOW (ref >=40)
Non-HDL Cholesterol (Calc): 133 mg/dL (calc) — ABNORMAL HIGH (ref <130)
Total CHOL/HDL Ratio: 5.2 (calc) — ABNORMAL HIGH (ref <5.0)
Triglycerides: 414 mg/dL — ABNORMAL HIGH (ref <150)

## 2020-03-30 LAB — HIV-1 RNA QUANT-NO REFLEX-BLD
HIV 1 RNA Quant: <20 copies/mL
HIV-1 RNA Quant, Log: <1.3 Log copies/mL

## 2020-04-09 ENCOUNTER — Other Ambulatory Visit: Payer: Self-pay | Admitting: Infectious Disease

## 2020-04-09 DIAGNOSIS — E781 Pure hyperglyceridemia: Secondary | ICD-10-CM

## 2020-04-09 DIAGNOSIS — I10 Essential (primary) hypertension: Secondary | ICD-10-CM

## 2020-04-11 MED FILL — AMITRIPTYLINE HCL 25 MG TAB: 25 | 30 days supply | Qty: 30 | Fill #5

## 2020-04-11 MED FILL — DESCOVY 200-25 MG TABS: 200-25 | 30 days supply | Qty: 30 | Fill #5

## 2020-04-11 MED FILL — INTELENCE 200 MG TABLET: 200 | 30 days supply | Qty: 60 | Fill #5

## 2020-04-17 ENCOUNTER — Telehealth: Payer: Self-pay

## 2020-04-17 NOTE — Telephone Encounter (Signed)
Patient called triage stating that an RN called earlier and their call was dropped. Stated that she was giving results. Unable to locate any note detailing call. Unable to leave voicemail.   Asia Favata Lorita Officer, RN

## 2020-05-08 ENCOUNTER — Other Ambulatory Visit: Payer: Self-pay | Admitting: Infectious Disease

## 2020-05-08 DIAGNOSIS — E781 Pure hyperglyceridemia: Secondary | ICD-10-CM

## 2020-05-08 DIAGNOSIS — I10 Essential (primary) hypertension: Secondary | ICD-10-CM

## 2020-05-08 DIAGNOSIS — B20 Human immunodeficiency virus [HIV] disease: Secondary | ICD-10-CM

## 2020-05-09 MED FILL — ROSUVASTATIN CALCIUM 20 MG: 20 | 30 days supply | Qty: 30 | Fill #0

## 2020-05-09 MED FILL — AMITRIPTYLINE HCL 25 MG TAB: 25 | 30 days supply | Qty: 30 | Fill #0

## 2020-05-09 MED FILL — INTELENCE 200 MG TABLET: 200 | 30 days supply | Qty: 60 | Fill #0

## 2020-05-09 MED FILL — METOPROLOL SUCCINATE ER 100: 100 | 30 days supply | Qty: 30 | Fill #0

## 2020-05-09 MED FILL — DESCOVY 200-25 MG TABS: 200-25 | 30 days supply | Qty: 30 | Fill #0

## 2020-05-09 NOTE — Telephone Encounter (Signed)
Attempted to call patient to schedule follow up appointment with office after receiving refill request. Patient did labs on 03/2020, but did not schedule follow up appointment.  Left voicemail requesting patient call office back to get appointment set up.  [place]

## 2020-06-07 ENCOUNTER — Other Ambulatory Visit: Payer: Self-pay | Admitting: Infectious Disease

## 2020-06-07 ENCOUNTER — Telehealth: Payer: Self-pay

## 2020-06-07 DIAGNOSIS — E781 Pure hyperglyceridemia: Secondary | ICD-10-CM

## 2020-06-07 DIAGNOSIS — I10 Essential (primary) hypertension: Secondary | ICD-10-CM

## 2020-06-07 DIAGNOSIS — B20 Human immunodeficiency virus [HIV] disease: Secondary | ICD-10-CM

## 2020-06-07 MED FILL — AMITRIPTYLINE HCL 25 MG TAB: 25 | 30 days supply | Qty: 30 | Fill #0

## 2020-06-07 MED FILL — ROSUVASTATIN CALCIUM 20 MG: 20 | 30 days supply | Qty: 30 | Fill #0

## 2020-06-07 MED FILL — METOPROLOL SUCCINATE ER 100: 100 | 30 days supply | Qty: 30 | Fill #0

## 2020-06-07 NOTE — Telephone Encounter (Signed)
Called patient to schedule overdue appointment with Dr. Tommy Medal. Patient came into office last for labs in [place] did not schedule follow up appointment.   States he is currently in [place] and wont be able to do a in person appointment. Is scheduled for video visit with MD this upcoming week. Patient would like to ask MD if taking one a day multivitamin interfere with absorption on antiviral. Advised patient to make sure he space both.   [place]

## 2020-06-11 MED FILL — DESCOVY 200-25 MG TABS: 200-25 | 30 days supply | Qty: 30 | Fill #0

## 2020-06-11 MED FILL — ETRAVIRINE 200 MG TABS: 200 | 30 days supply | Qty: 60 | Fill #0

## 2020-06-12 ENCOUNTER — Telehealth (INDEPENDENT_AMBULATORY_CARE_PROVIDER_SITE_OTHER): Payer: BC Managed Care – PPO | Admitting: Infectious Disease

## 2020-06-12 ENCOUNTER — Other Ambulatory Visit: Payer: Self-pay | Admitting: Infectious Disease

## 2020-06-12 ENCOUNTER — Encounter: Payer: Self-pay | Admitting: Infectious Disease

## 2020-06-12 ENCOUNTER — Other Ambulatory Visit: Payer: Self-pay

## 2020-06-12 DIAGNOSIS — B20 Human immunodeficiency virus [HIV] disease: Secondary | ICD-10-CM

## 2020-06-12 MED ORDER — INTELENCE 200 MG PO TABS: ORAL_TABLET | ORAL | 11 refills | Status: DC

## 2020-06-12 MED ORDER — DESCOVY 200-25 MG PO TABS: 1.0000 | ORAL_TABLET | Freq: Every day | ORAL | 11 refills | Status: DC

## 2020-06-12 NOTE — Progress Notes (Signed)
Virtual Visit via Video Note  I connected with Ricardo [name] on 06/12/20 at  9:30 AM EDT by a video enabled telemedicine application and verified that I am speaking with the correct person using two identifiers.  Location: Patient: Hotel room Provider: [facility]   I discussed the limitations of evaluation and management by telemedicine and the availability of in person appointments. The patient expressed understanding and agreed to proceed.  History of Present Illness:   Ricardo [name] a 59 year old Caucasian man living with HIV that is perfectly controlled currently on intolerance and Descovy.  He has comorbid diabetes potential both of which she claims are better controlled in the past.  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  Past Medical History:  Diagnosis Date  . Abnormal liver function tests 11/16/2016  . Diabetes mellitus type 2 in obese ([place]) 11/20/2015  . Diabetes mellitus without complication ([place])   . Diabetes type 2, uncontrolled ([place])   . HIV (human immunodeficiency virus infection) ([place])   . Hyperlipidemia   . LVH (left ventricular hypertrophy)   . Obesity 05/08/2019  . Palpitations   . Rash and nonspecific skin eruption 09/15/2016    No past surgical history on file.  Family History  Problem Relation Age of Onset  . Coronary artery disease Father 36      Social History   Socioeconomic History  . Marital status: Married    Spouse name: Not on file  . Number of children: Not on file  . Years of education: Not on file  . Highest education level: Not on file  Occupational History  . Not on file  Tobacco Use  . Smoking status: Never Smoker  . Smokeless tobacco: Never Used  Substance and Sexual Activity  . Alcohol use: No  . Drug use: No  . Sexual activity: Yes    Partners: Female    Comment: pt. declined condoms  Other Topics Concern  . Not on file  Social History Narrative  . Not on file   Social Determinants of Health   Financial  Resource Strain:   . Difficulty of Paying Living Expenses:   Food Insecurity:   . Worried About Charity fundraiser in the Last Year:   . Arboriculturist in the Last Year:   Transportation Needs:   . Film/video editor (Medical):   Marland Kitchen Lack of Transportation (Non-Medical):   Physical Activity:   . Days of Exercise per Week:   . Minutes of Exercise per Session:   Stress:   . Feeling of Stress :   Social Connections:   . Frequency of Communication with Friends and Family:   . Frequency of Social Gatherings with Friends and Family:   . Attends Religious Services:   . Active Member of Clubs or Organizations:   . Attends Archivist Meetings:   Marland Kitchen Marital Status:     Allergies  Allergen Reactions  . Amoxicillin-Pot Clavulanate      Current Outpatient Medications:  .  amitriptyline (ELAVIL) 25 MG tablet, TAKE 1 TABLET (25 MG TOTAL) BY MOUTH AT BEDTIME., Disp: 30 tablet, Rfl: 0 .  aspirin 81 MG tablet, Take 81 mg by mouth daily.  , Disp: , Rfl:  .  Blood Glucose Monitoring Suppl (ONE TOUCH ULTRA 2) W/DEVICE KIT, Use to check blood sugars 1/day., Disp: 1 each, Rfl: 0 .  emtricitabine-tenofovir AF (DESCOVY) 200-25 MG tablet, Take 1 tablet by mouth daily., Disp: 30 tablet, Rfl: 11 .  Etravirine (INTELENCE) 200 MG TABS, TAKE 2 TABLETS (400 MG TOTAL) BY MOUTH DAILY., Disp: 60 tablet, Rfl: 11 .  glucose blood (ONE TOUCH ULTRA TEST) test strip, Use to check blood sugars three times daily., Disp: 100 each, Rfl: 11 .  metoprolol succinate (TOPROL-XL) 100 MG 24 hr tablet, TAKE 1 TABLET BY MOUTH ONCE DAILY WITH FOOD OR IMMEDIATELY FOLLOWING A MEAL **NEEDS APPOINTMENT FOR FURTHER REFILLS**, Disp: 30 tablet, Rfl: 0 .  rosuvastatin (CRESTOR) 20 MG tablet, TAKE 1 TABLET BY MOUTH ONCE DAILY, Disp: 30 tablet, Rfl: 0 .  TRULICITY 2.37 SE/8.3TD SOPN, INJECT 0.75 MG INTO THE SKIN ONCE A WEEK FOR 30 DAYS. PT SHOULD MAKE APPT WITH PCP OR ENDOCRINE AND TITRATE DOSE UPWARDS, Disp: 2 mL, Rfl: 0 .   pioglitazone (ACTOS) 30 MG tablet, TAKE 1 TABLET (30 MG TOTAL) BY MOUTH DAILY. (Patient not taking: Reported on 06/12/2020), Disp: 30 tablet, Rfl: 5 .  traZODone (DESYREL) 50 MG tablet, Take 1 tablet (50 mg total) by mouth at bedtime. (Patient not taking: Reported on 06/12/2020), Disp: 30 tablet, Rfl: 11  Review of systems as above otherwise 12 point view systems negative     Observations/Objective:  Roben appeared in no acute distress and seemed comfortable  Assessment and Plan: HIV disease continue current regimen I think you be well served by switching over to [facility] and Descovy but he is reluctant to make that change at this time.  We will have him come back for labs in 6 months time and have another video visit thereafter  Diabetes mellitus defer to primary care.  He claims his A1c is down in the 6 range now.    Follow Up Instructions:    I discussed the assessment and treatment plan with the patient. The patient was provided an opportunity to ask questions and all were answered. The patient agreed with the plan and demonstrated an understanding of the instructions.   The patient was advised to call back or seek an in-person evaluation if the symptoms worsen or if the condition fails to improve as anticipated.   Alcide Evener, MD

## 2020-07-04 ENCOUNTER — Other Ambulatory Visit: Payer: Self-pay | Admitting: Infectious Disease

## 2020-07-04 DIAGNOSIS — I10 Essential (primary) hypertension: Secondary | ICD-10-CM

## 2020-07-04 DIAGNOSIS — E781 Pure hyperglyceridemia: Secondary | ICD-10-CM

## 2020-07-09 ENCOUNTER — Telehealth: Payer: Self-pay | Admitting: Pharmacy Technician

## 2020-07-09 MED FILL — ROSUVASTATIN CALCIUM 20 MG: 20 | 30 days supply | Qty: 30 | Fill #0

## 2020-07-09 MED FILL — ETRAVIRINE 200 MG TABS: 200 | 30 days supply | Qty: 60 | Fill #0

## 2020-07-09 MED FILL — METOPROLOL SUCCINATE ER 100: 100 | 30 days supply | Qty: 30 | Fill #0

## 2020-07-09 MED FILL — AMITRIPTYLINE HCL 25 MG TAB: 25 | 30 days supply | Qty: 30 | Fill #0

## 2020-07-09 MED FILL — DESCOVY 200-25 MG TABS: 200-25 | 30 days supply | Qty: 30 | Fill #0

## 2020-07-09 NOTE — Telephone Encounter (Signed)
[facility] Patient Advocate Encounter   I was successful in securing patient a $7500 grant from [employer] [place] (PAF) to provide copayment coverage for Etravirine.  This will make the out of pocket cost $0.      The billing information is as follows and has been shared with [place].     Patient knows to call the office with questions or concerns.  [name]. Nadara Mustard [place] [address] Patient [facility] Phone: [phone] Fax:  [phone]

## 2020-08-02 ENCOUNTER — Other Ambulatory Visit: Payer: Self-pay | Admitting: Infectious Disease

## 2020-08-02 DIAGNOSIS — I10 Essential (primary) hypertension: Secondary | ICD-10-CM

## 2020-08-02 DIAGNOSIS — E781 Pure hyperglyceridemia: Secondary | ICD-10-CM

## 2020-08-05 ENCOUNTER — Other Ambulatory Visit (HOSPITAL_COMMUNITY): Payer: Self-pay | Admitting: Family

## 2020-08-05 MED FILL — METOPROLOL SUCCINATE ER 100: 100 | 30 days supply | Qty: 30 | Fill #0

## 2020-08-05 MED FILL — TRULICITY 3 MG/0.5ML SOPN: 3 | 28 days supply | Qty: 2 | Fill #0

## 2020-08-05 MED FILL — AMITRIPTYLINE HCL 25 MG TAB: 25 | 30 days supply | Qty: 30 | Fill #0

## 2020-08-05 MED FILL — FARXIGA 10 MG TABLET: 10 | 90 days supply | Qty: 90 | Fill #0

## 2020-08-05 MED FILL — ROSUVASTATIN CALCIUM 20 MG: 20 | 30 days supply | Qty: 30 | Fill #0

## 2020-08-05 MED FILL — GLIPIZIDE ER 5 MG TB24: 5 | 90 days supply | Qty: 90 | Fill #0

## 2020-08-05 MED FILL — FENOFIBRATE 160 MG TABLET: 160 | 90 days supply | Qty: 90 | Fill #0

## 2020-08-06 MED FILL — DESCOVY 200-25 MG TABS: 200-25 | 30 days supply | Qty: 30 | Fill #1

## 2020-08-06 MED FILL — ETRAVIRINE 200 MG TABS: 200 | 30 days supply | Qty: 60 | Fill #1

## 2020-08-14 ENCOUNTER — Other Ambulatory Visit (HOSPITAL_COMMUNITY): Payer: Self-pay | Admitting: Family

## 2020-08-14 DIAGNOSIS — Z1211 Encounter for screening for malignant neoplasm of colon: Secondary | ICD-10-CM | POA: Diagnosis not present

## 2020-08-14 DIAGNOSIS — B20 Human immunodeficiency virus [HIV] disease: Secondary | ICD-10-CM | POA: Diagnosis not present

## 2020-08-14 DIAGNOSIS — E1169 Type 2 diabetes mellitus with other specified complication: Secondary | ICD-10-CM | POA: Diagnosis not present

## 2020-08-14 DIAGNOSIS — I10 Essential (primary) hypertension: Secondary | ICD-10-CM | POA: Diagnosis not present

## 2020-08-14 MED FILL — LISINOPRIL 2.5 MG TABLET: 2.5 | 90 days supply | Qty: 90 | Fill #0

## 2020-08-14 MED FILL — TRULICITY 4.5 MG/0.5ML SOPN: 4.5 | 28 days supply | Qty: 2 | Fill #0

## 2020-08-30 ENCOUNTER — Other Ambulatory Visit: Payer: Self-pay | Admitting: Infectious Disease

## 2020-08-30 DIAGNOSIS — E781 Pure hyperglyceridemia: Secondary | ICD-10-CM

## 2020-08-30 DIAGNOSIS — I10 Essential (primary) hypertension: Secondary | ICD-10-CM

## 2020-08-30 MED FILL — ROSUVASTATIN CALCIUM 20 MG: 20 | 30 days supply | Qty: 30 | Fill #0

## 2020-08-30 MED FILL — AMITRIPTYLINE HCL 25 MG TAB: 25 | 30 days supply | Qty: 30 | Fill #0

## 2020-08-30 MED FILL — METOPROLOL SUCCINATE ER 100: 100 | 30 days supply | Qty: 30 | Fill #0

## 2020-09-03 MED FILL — ETRAVIRINE 200 MG TABS: 200 | 30 days supply | Qty: 60 | Fill #2

## 2020-09-03 MED FILL — DESCOVY 200-25 MG TABS: 200-25 | 30 days supply | Qty: 30 | Fill #2

## 2020-09-04 MED FILL — TRULICITY 4.5 MG/0.5ML SOPN: 4.5 | 28 days supply | Qty: 2 | Fill #1

## 2020-09-30 MED FILL — ETRAVIRINE 200 MG TABS: 200 | 30 days supply | Qty: 60 | Fill #3

## 2020-09-30 MED FILL — DESCOVY 200-25 MG TABS: 200-25 | 30 days supply | Qty: 30 | Fill #3

## 2020-10-28 MED FILL — DESCOVY 200-25 MG TABS: 200-25 | 30 days supply | Qty: 30 | Fill #4

## 2020-10-28 MED FILL — ROSUVASTATIN CALCIUM 20 MG: 20 | 30 days supply | Qty: 30 | Fill #1

## 2020-10-28 MED FILL — ETRAVIRINE 200 MG TABS: 200 | 30 days supply | Qty: 60 | Fill #4

## 2020-10-28 MED FILL — GLIPIZIDE ER 5 MG TB24: 5 | 90 days supply | Qty: 90 | Fill #0

## 2020-10-28 MED FILL — AMITRIPTYLINE HCL 25 MG TAB: 25 | 30 days supply | Qty: 30 | Fill #1

## 2020-10-28 MED FILL — FENOFIBRATE 160 MG TABLET: 160 | 90 days supply | Qty: 90 | Fill #0

## 2020-10-28 MED FILL — METOPROLOL SUCCINATE ER 100: 100 | 30 days supply | Qty: 30 | Fill #1

## 2020-10-28 MED FILL — TRULICITY 4.5 MG/0.5ML SOPN: 4.5 | 28 days supply | Qty: 2 | Fill #2

## 2020-10-28 MED FILL — LISINOPRIL 2.5 MG TABLET: 2.5 | 90 days supply | Qty: 90 | Fill #0

## 2020-11-21 ENCOUNTER — Other Ambulatory Visit (HOSPITAL_COMMUNITY): Payer: Self-pay | Admitting: Family

## 2020-11-25 MED FILL — DESCOVY 200-25 MG TABS: 200-25 | 30 days supply | Qty: 30 | Fill #5

## 2020-11-25 MED FILL — ROSUVASTATIN CALCIUM 20 MG: 20 | 30 days supply | Qty: 30 | Fill #2

## 2020-11-25 MED FILL — METOPROLOL SUCCINATE ER 100: 100 | 30 days supply | Qty: 30 | Fill #2

## 2020-11-25 MED FILL — ETRAVIRINE 200 MG TABS: 200 | 30 days supply | Qty: 60 | Fill #5

## 2020-11-25 MED FILL — TRULICITY 4.5 MG/0.5ML SOPN: 4.5 | 28 days supply | Qty: 2 | Fill #3

## 2020-11-25 MED FILL — AMITRIPTYLINE HCL 25 MG TAB: 25 | 30 days supply | Qty: 30 | Fill #2

## 2020-12-02 MED FILL — GLIPIZIDE ER 5 MG TB24: 5 | 90 days supply | Qty: 180 | Fill #0

## 2020-12-23 ENCOUNTER — Other Ambulatory Visit: Payer: BC Managed Care – PPO

## 2020-12-23 MED FILL — METOPROLOL SUCCINATE ER 100: 100 | 30 days supply | Qty: 30 | Fill #3

## 2020-12-23 MED FILL — ROSUVASTATIN CALCIUM 20 MG: 20 | 30 days supply | Qty: 30 | Fill #3

## 2020-12-23 MED FILL — ETRAVIRINE 200 MG TABS: 200 | 30 days supply | Qty: 60 | Fill #6

## 2020-12-23 MED FILL — DESCOVY 200-25 MG TABS: 200-25 | 30 days supply | Qty: 30 | Fill #6

## 2020-12-23 MED FILL — AMITRIPTYLINE HCL 25 MG TAB: 25 | 30 days supply | Qty: 30 | Fill #3

## 2021-01-08 ENCOUNTER — Telehealth: Payer: BC Managed Care – PPO | Admitting: Infectious Disease

## 2021-01-20 ENCOUNTER — Other Ambulatory Visit (HOSPITAL_COMMUNITY): Payer: Self-pay | Admitting: Family

## 2021-01-20 MED FILL — TRULICITY 4.5 MG/0.5ML SOPN: 4.5 | 28 days supply | Qty: 2 | Fill #0

## 2021-01-20 MED FILL — FARXIGA 10 MG TABLET: 10 | 90 days supply | Qty: 90 | Fill #0

## 2021-01-22 MED FILL — LISINOPRIL 2.5 MG TABLET: 2.5 | 90 days supply | Qty: 90 | Fill #0

## 2021-01-22 MED FILL — DESCOVY 200-25 MG TABS: 200-25 | 30 days supply | Qty: 30 | Fill #7

## 2021-01-22 MED FILL — METOPROLOL SUCCINATE ER 100: 100 | 30 days supply | Qty: 30 | Fill #4

## 2021-01-22 MED FILL — FENOFIBRATE 160 MG TABLET: 160 | 90 days supply | Qty: 90 | Fill #0

## 2021-01-22 MED FILL — ETRAVIRINE 200 MG TABS: 200 | 30 days supply | Qty: 60 | Fill #7

## 2021-01-22 MED FILL — ROSUVASTATIN CALCIUM 20 MG: 20 | 30 days supply | Qty: 30 | Fill #4

## 2021-01-22 MED FILL — AMITRIPTYLINE HCL 25 MG TAB: 25 | 30 days supply | Qty: 30 | Fill #4

## 2021-02-18 MED FILL — DESCOVY 200-25 MG TABS: 200-25 | 30 days supply | Qty: 30 | Fill #8

## 2021-02-18 MED FILL — ETRAVIRINE 200 MG TABS: 200 | 30 days supply | Qty: 60 | Fill #8

## 2021-02-27 ENCOUNTER — Other Ambulatory Visit: Payer: Self-pay | Admitting: *Deleted

## 2021-02-27 DIAGNOSIS — B20 Human immunodeficiency virus [HIV] disease: Secondary | ICD-10-CM

## 2021-02-27 DIAGNOSIS — Z113 Encounter for screening for infections with a predominantly sexual mode of transmission: Secondary | ICD-10-CM

## 2021-02-27 DIAGNOSIS — Z79899 Other long term (current) drug therapy: Secondary | ICD-10-CM

## 2021-03-06 ENCOUNTER — Other Ambulatory Visit: Payer: BC Managed Care – PPO

## 2021-03-06 DIAGNOSIS — M48062 Spinal stenosis, lumbar region with neurogenic claudication: Secondary | ICD-10-CM | POA: Diagnosis not present

## 2021-03-07 ENCOUNTER — Other Ambulatory Visit (HOSPITAL_COMMUNITY): Payer: Self-pay | Admitting: Family

## 2021-03-07 DIAGNOSIS — B20 Human immunodeficiency virus [HIV] disease: Secondary | ICD-10-CM | POA: Diagnosis not present

## 2021-03-07 DIAGNOSIS — E781 Pure hyperglyceridemia: Secondary | ICD-10-CM | POA: Diagnosis not present

## 2021-03-07 DIAGNOSIS — I10 Essential (primary) hypertension: Secondary | ICD-10-CM | POA: Diagnosis not present

## 2021-03-07 DIAGNOSIS — E1169 Type 2 diabetes mellitus with other specified complication: Secondary | ICD-10-CM | POA: Diagnosis not present

## 2021-03-10 ENCOUNTER — Other Ambulatory Visit: Payer: BC Managed Care – PPO

## 2021-03-10 ENCOUNTER — Other Ambulatory Visit: Payer: Self-pay

## 2021-03-10 ENCOUNTER — Other Ambulatory Visit (HOSPITAL_COMMUNITY)
Admission: RE | Admit: 2021-03-10 | Discharge: 2021-03-10 | Disposition: A | Discharge status: Home / Self Care | Payer: BC Managed Care – PPO | Source: Ambulatory Visit | Attending: Infectious Disease | Admitting: Infectious Disease

## 2021-03-10 DIAGNOSIS — B20 Human immunodeficiency virus [HIV] disease: Secondary | ICD-10-CM | POA: Insufficient documentation

## 2021-03-10 DIAGNOSIS — Z113 Encounter for screening for infections with a predominantly sexual mode of transmission: Secondary | ICD-10-CM | POA: Diagnosis not present

## 2021-03-10 DIAGNOSIS — Z79899 Other long term (current) drug therapy: Secondary | ICD-10-CM | POA: Diagnosis not present

## 2021-03-11 LAB — T-HELPER CELL (CD4) - (RCID CLINIC ONLY)
CD4 % Helper T Cell: 32 % — ABNORMAL LOW (ref 33–65)
CD4 T Cell Abs: 567 /uL (ref 400–1790)

## 2021-03-11 LAB — URINE CYTOLOGY ANCILLARY ONLY
Chlamydia: NEGATIVE
Comment: NEGATIVE
Comment: NORMAL
Neisseria Gonorrhea: NEGATIVE

## 2021-03-12 LAB — COMPLETE METABOLIC PANEL WITH GFR
AG Ratio: 1.4 (calc) (ref 1.0–2.5)
ALT: 31 U/L (ref 9–46)
AST: 41 U/L — ABNORMAL HIGH (ref 10–35)
Albumin: 4.1 g/dL (ref 3.6–5.1)
Alkaline phosphatase (APISO): 50 U/L (ref 35–144)
BUN: 23 mg/dL (ref 7–25)
CO2: 23 mmol/L (ref 20–32)
Calcium: 8.6 mg/dL (ref 8.6–10.3)
Chloride: 106 mmol/L (ref 98–110)
Creat: 1.13 mg/dL (ref 0.70–1.25)
GFR, Est African American: 81 mL/min/{1.73_m2} (ref >=60)
GFR, Est Non African American: 70 mL/min/{1.73_m2} (ref >=60)
Globulin: 2.9 g/dL (calc) (ref 1.9–3.7)
Glucose, Bld: 115 mg/dL — ABNORMAL HIGH (ref 65–99)
Potassium: 3.8 mmol/L (ref 3.5–5.3)
Sodium: 139 mmol/L (ref 135–146)
Total Bilirubin: 0.5 mg/dL (ref 0.2–1.2)
Total Protein: 7 g/dL (ref 6.1–8.1)

## 2021-03-12 LAB — HIV-1 RNA QUANT-NO REFLEX-BLD
HIV 1 RNA Quant: 367 Copies/mL — ABNORMAL HIGH
HIV-1 RNA Quant, Log: 2.56 Log cps/mL — ABNORMAL HIGH

## 2021-03-12 LAB — CBC WITH DIFFERENTIAL/PLATELET
Absolute Monocytes: 403 cells/uL (ref 200–950)
Basophils Absolute: 21 cells/uL (ref 0–200)
Basophils Relative: 0.5 %
Eosinophils Absolute: 59 cells/uL (ref 15–500)
Eosinophils Relative: 1.4 %
HCT: 44.3 % (ref 38.5–50.0)
Hemoglobin: 15.2 g/dL (ref 13.2–17.1)
Lymphs Abs: 1802 cells/uL (ref 850–3900)
MCH: 31.8 pg (ref 27.0–33.0)
MCHC: 34.3 g/dL (ref 32.0–36.0)
MCV: 92.7 fL (ref 80.0–100.0)
MPV: 9.8 fL (ref 7.5–12.5)
Monocytes Relative: 9.6 %
Neutro Abs: 1915 cells/uL (ref 1500–7800)
Neutrophils Relative %: 45.6 %
Platelets: 208 10*3/uL (ref 140–400)
RBC: 4.78 10*6/uL (ref 4.20–5.80)
RDW: 12.9 % (ref 11.0–15.0)
Total Lymphocyte: 42.9 %
WBC: 4.2 10*3/uL (ref 3.8–10.8)

## 2021-03-12 LAB — LIPID PANEL
Cholesterol: 142 mg/dL (ref <200)
HDL: 26 mg/dL — ABNORMAL LOW (ref >=40)
LDL Cholesterol (Calc): 77 mg/dL (calc)
Non-HDL Cholesterol (Calc): 116 mg/dL (calc) (ref <130)
Total CHOL/HDL Ratio: 5.5 (calc) — ABNORMAL HIGH (ref <5.0)
Triglycerides: 320 mg/dL — ABNORMAL HIGH (ref <150)

## 2021-03-12 LAB — RPR: RPR Ser Ql: NONREACTIVE

## 2021-03-14 ENCOUNTER — Other Ambulatory Visit: Payer: Self-pay | Admitting: Infectious Disease

## 2021-03-14 ENCOUNTER — Telehealth: Payer: Self-pay

## 2021-03-14 DIAGNOSIS — I10 Essential (primary) hypertension: Secondary | ICD-10-CM

## 2021-03-14 DIAGNOSIS — E781 Pure hyperglyceridemia: Secondary | ICD-10-CM

## 2021-03-14 MED FILL — ROSUVASTATIN CALCIUM 20 MG: 20 | 30 days supply | Qty: 30 | Fill #0

## 2021-03-14 MED FILL — METOPROLOL SUCCINATE ER 100: 100 | 30 days supply | Qty: 30 | Fill #0

## 2021-03-14 MED FILL — AMITRIPTYLINE HCL 25 MG TAB: 25 | 30 days supply | Qty: 30 | Fill #0

## 2021-03-14 NOTE — Telephone Encounter (Signed)
Called patient to discuss labs and follow up lab work. Explained that provider would like additional labs (VL and genotype) due to changes in his viral load + lower CD4. Patient previously expressed concerns about lab work changing. 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 Patient stated he can call 1-2 days ahead of time to schedule a lab appointment since his schedule is prone to change. Will try to arrange lab visit first week of April.   Provider notified. No further questions at this time.  Dorie Lorita Officer, RN

## 2021-03-17 MED FILL — ETRAVIRINE 200 MG TABS: 200 | 30 days supply | Qty: 60 | Fill #9

## 2021-03-17 MED FILL — TRULICITY 4.5 MG/0.5ML SOPN: 4.5 | 28 days supply | Qty: 2 | Fill #1

## 2021-03-17 MED FILL — DESCOVY 200-25 MG TABS: 200-25 | 30 days supply | Qty: 30 | Fill #9

## 2021-03-20 ENCOUNTER — Encounter: Payer: Self-pay | Admitting: Infectious Disease

## 2021-03-20 ENCOUNTER — Other Ambulatory Visit: Payer: Self-pay

## 2021-03-20 ENCOUNTER — Telehealth (INDEPENDENT_AMBULATORY_CARE_PROVIDER_SITE_OTHER): Payer: BC Managed Care – PPO | Admitting: Infectious Disease

## 2021-03-20 ENCOUNTER — Other Ambulatory Visit (HOSPITAL_COMMUNITY): Payer: Self-pay

## 2021-03-20 ENCOUNTER — Other Ambulatory Visit: Payer: Self-pay | Admitting: Infectious Disease

## 2021-03-20 DIAGNOSIS — E781 Pure hyperglyceridemia: Secondary | ICD-10-CM | POA: Diagnosis not present

## 2021-03-20 DIAGNOSIS — B20 Human immunodeficiency virus [HIV] disease: Secondary | ICD-10-CM

## 2021-03-20 DIAGNOSIS — E1169 Type 2 diabetes mellitus with other specified complication: Secondary | ICD-10-CM

## 2021-03-20 NOTE — Progress Notes (Signed)
Virtual Visit via Telephone Note  I connected with Ricardo [name] on 03/20/21 at 11:00 AM EDT by telephone and verified that I am speaking with the correct person using two identifiers.  Location: Patient: His Truck Provider: [facility]   I discussed the limitations, risks, security and privacy concerns of performing an evaluation and management service by telephone and the availability of in person appointments. I also discussed with the patient that there may be a patient responsible charge related to this service. The patient expressed understanding and agreed to proceed.   History of Present Illness:    Ricardo [name] is a 60 year old Caucasian man living with HIV that has typically been well controlled but hose most recent VL was above 300.  He denies missing any doses of his medications whatsoever. He has not started any supplements.  He is now taking his intelence BID which he does not need to do.    Past Medical History:  Diagnosis Date  . Abnormal liver function tests 11/16/2016  . Diabetes mellitus type 2 in obese ([facility]) 11/20/2015  . Diabetes mellitus without complication ([facility])   . Diabetes type 2, uncontrolled ([facility])   . HIV (human immunodeficiency virus infection) ([facility])   . Hyperlipidemia   . LVH (left ventricular hypertrophy)   . Obesity 05/08/2019  . Palpitations   . Rash and nonspecific skin eruption 09/15/2016    No past surgical history on file.  Family History  Problem Relation Age of Onset  . Coronary artery disease Father 36      Social History   Socioeconomic History  . Marital status: Married    Spouse name: Not on file  . Number of children: Not on file  . Years of education: Not on file  . Highest education level: Not on file  Occupational History  . Not on file  Tobacco Use  . Smoking status: Never Smoker  . Smokeless tobacco: Never Used  Substance and Sexual Activity  . Alcohol use: No  . Drug use: No  . Sexual activity: Yes    Partners:  Female    Comment: pt. declined condoms  Other Topics Concern  . Not on file  Social History Narrative  . Not on file   Social Determinants of Health   Financial Resource Strain: Not on file  Food Insecurity: Not on file  Transportation Needs: Not on file  Physical Activity: Not on file  Stress: Not on file  Social Connections: Not on file    Allergies  Allergen Reactions  . Amoxicillin-Pot Clavulanate      Current Outpatient Medications:  .  amitriptyline (ELAVIL) 25 MG tablet, TAKE 1 TABLET BY MOUTH ONCE DAILY AT BEDTIME, Disp: 30 tablet, Rfl: 4 .  aspirin 81 MG tablet, Take 81 mg by mouth daily.  , Disp: , Rfl:  .  Blood Glucose Monitoring Suppl (ONE TOUCH ULTRA 2) W/DEVICE KIT, Use to check blood sugars 1/day., Disp: 1 each, Rfl: 0 .  emtricitabine-tenofovir AF (DESCOVY) 200-25 MG tablet, Take 1 tablet by mouth daily., Disp: 30 tablet, Rfl: 11 .  Etravirine (INTELENCE) 200 MG TABS, TAKE 2 TABLETS (400 MG TOTAL) BY MOUTH DAILY., Disp: 60 tablet, Rfl: 11 .  glucose blood (ONE TOUCH ULTRA TEST) test strip, Use to check blood sugars three times daily., Disp: 100 each, Rfl: 11 .  metoprolol succinate (TOPROL-XL) 100 MG 24 hr tablet, TAKE 1 TABLET BY MOUTH ONCE DAILY WITH FOOD OR IMMEDIATELY FOLLOWING A MEAL **NEEDS APPOINTMENT FOR FURTHER REFILLS**,  Disp: 30 tablet, Rfl: 4 .  pioglitazone (ACTOS) 30 MG tablet, TAKE 1 TABLET (30 MG TOTAL) BY MOUTH DAILY. (Patient not taking: Reported on 06/12/2020), Disp: 30 tablet, Rfl: 5 .  rosuvastatin (CRESTOR) 20 MG tablet, TAKE 1 TABLET BY MOUTH ONCE DAILY, Disp: 30 tablet, Rfl: 4 .  traZODone (DESYREL) 50 MG tablet, Take 1 tablet (50 mg total) by mouth at bedtime. (Patient not taking: Reported on 06/12/2020), Disp: 30 tablet, Rfl: 11 .  TRULICITY 0.75 MG/0.5ML SOPN, INJECT 0.75 MG INTO THE SKIN ONCE A WEEK FOR 30 DAYS. PT SHOULD MAKE APPT WITH PCP OR ENDOCRINE AND TITRATE DOSE UPWARDS, Disp: 2 mL, Rfl: 0    Observations/Objective:  Ricardo [name]  seems to be be doing relatively well though he is disturbed as am I re his VL coming up  Assessment and Plan:  HIV disease: I have ordered HIV viral load w reflex to genotype for next week and he will come and get this done and then see me in May when he will also be in [place]  DM: per PCP  Hyperlipidemia: per PCP  Follow Up Instructions:    I discussed the assessment and treatment plan with the patient. The patient was provided an opportunity to ask questions and all were answered. The patient agreed with the plan and demonstrated an understanding of the instructions.   The patient was advised to call back or seek an in-person evaluation if the symptoms worsen or if the condition fails to improve as anticipated.  I provided 23 minutes of non-face-to-face time during this encounter.   Cornelius Van Dam, MD   

## 2021-04-03 ENCOUNTER — Other Ambulatory Visit: Payer: BC Managed Care – PPO

## 2021-04-03 ENCOUNTER — Other Ambulatory Visit: Payer: Self-pay

## 2021-04-03 DIAGNOSIS — B20 Human immunodeficiency virus [HIV] disease: Secondary | ICD-10-CM | POA: Diagnosis not present

## 2021-04-07 LAB — HIV RNA, RTPCR W/R GT (RTI, PI,INT)
HIV 1 RNA Quant: <20 copies/mL — AB
HIV-1 RNA Quant, Log: <1.3 Log copies/mL — AB

## 2021-04-11 ENCOUNTER — Other Ambulatory Visit (HOSPITAL_COMMUNITY): Payer: Self-pay

## 2021-04-14 ENCOUNTER — Other Ambulatory Visit (HOSPITAL_COMMUNITY): Payer: Self-pay

## 2021-04-16 ENCOUNTER — Other Ambulatory Visit (HOSPITAL_COMMUNITY): Payer: Self-pay

## 2021-04-16 MED ORDER — FARXIGA 10 MG PO TABS
10.0000 mg | ORAL_TABLET | Freq: Every day | ORAL | 0 refills | Status: DC
  Filled 2021-04-16: qty 90, 90d supply, fill #0

## 2021-04-16 MED ORDER — LISINOPRIL 2.5 MG PO TABS
2.5000 mg | ORAL_TABLET | Freq: Every day | ORAL | 1 refills | Status: DC
  Filled 2021-04-16: qty 90, 90d supply, fill #0
  Filled 2021-05-15 – 2021-06-12 (×2): qty 90, 90d supply, fill #1

## 2021-04-16 MED FILL — Amitriptyline HCl Tab 25 MG: ORAL | 30 days supply | Qty: 30 | Fill #0 | Status: AC

## 2021-04-16 MED FILL — Etravirine Tab 200 MG: ORAL | 30 days supply | Qty: 60 | Fill #0 | Status: AC

## 2021-04-16 MED FILL — Fenofibrate Tab 160 MG: ORAL | 90 days supply | Qty: 90 | Fill #0 | Status: AC

## 2021-04-16 MED FILL — Dulaglutide Soln Auto-injector 4.5 MG/0.5ML: SUBCUTANEOUS | 28 days supply | Qty: 2 | Fill #0 | Status: AC

## 2021-04-16 MED FILL — Metoprolol Succinate Tab ER 24HR 100 MG (Tartrate Equiv): ORAL | 30 days supply | Qty: 30 | Fill #0 | Status: AC

## 2021-04-16 MED FILL — Emtricitabine-Tenofovir Alafenamide Fumarate Tab 200-25 MG: ORAL | 30 days supply | Qty: 30 | Fill #0 | Status: AC

## 2021-04-16 MED FILL — Glipizide Tab ER 24HR 5 MG: ORAL | 90 days supply | Qty: 180 | Fill #0 | Status: AC

## 2021-04-16 MED FILL — Rosuvastatin Calcium Tab 20 MG: ORAL | 30 days supply | Qty: 30 | Fill #0 | Status: AC

## 2021-04-17 ENCOUNTER — Other Ambulatory Visit (HOSPITAL_COMMUNITY): Payer: Self-pay

## 2021-04-18 ENCOUNTER — Other Ambulatory Visit (HOSPITAL_COMMUNITY): Payer: Self-pay

## 2021-04-28 ENCOUNTER — Ambulatory Visit: Payer: BC Managed Care – PPO | Admitting: Infectious Disease

## 2021-05-12 ENCOUNTER — Other Ambulatory Visit (HOSPITAL_COMMUNITY): Payer: Self-pay

## 2021-05-14 ENCOUNTER — Other Ambulatory Visit (HOSPITAL_COMMUNITY): Payer: Self-pay

## 2021-05-14 MED ORDER — GLIPIZIDE ER 5 MG PO TB24
ORAL_TABLET | ORAL | 0 refills | Status: DC
  Filled 2021-05-14 – 2021-06-12 (×2): qty 180, 90d supply, fill #0

## 2021-05-14 MED ORDER — FARXIGA 10 MG PO TABS
10.0000 mg | ORAL_TABLET | Freq: Every day | ORAL | 0 refills | Status: DC
  Filled 2021-05-14 – 2021-06-12 (×2): qty 90, 90d supply, fill #0

## 2021-05-15 ENCOUNTER — Other Ambulatory Visit (HOSPITAL_COMMUNITY): Payer: Self-pay

## 2021-05-15 MED FILL — Amitriptyline HCl Tab 25 MG: ORAL | 30 days supply | Qty: 30 | Fill #1 | Status: AC

## 2021-05-15 MED FILL — Dulaglutide Soln Auto-injector 4.5 MG/0.5ML: SUBCUTANEOUS | 28 days supply | Qty: 2 | Fill #1 | Status: AC

## 2021-05-15 MED FILL — Fenofibrate Tab 160 MG: ORAL | 90 days supply | Qty: 90 | Fill #1 | Status: CN

## 2021-05-15 MED FILL — Emtricitabine-Tenofovir Alafenamide Fumarate Tab 200-25 MG: ORAL | 30 days supply | Qty: 30 | Fill #1 | Status: AC

## 2021-05-15 MED FILL — Metoprolol Succinate Tab ER 24HR 100 MG (Tartrate Equiv): ORAL | 30 days supply | Qty: 30 | Fill #1 | Status: AC

## 2021-05-15 MED FILL — Rosuvastatin Calcium Tab 20 MG: ORAL | 30 days supply | Qty: 30 | Fill #1 | Status: AC

## 2021-05-15 MED FILL — Etravirine Tab 200 MG: ORAL | 30 days supply | Qty: 60 | Fill #1 | Status: AC

## 2021-05-26 ENCOUNTER — Other Ambulatory Visit: Payer: Self-pay

## 2021-05-26 DIAGNOSIS — R599 Enlarged lymph nodes, unspecified: Secondary | ICD-10-CM

## 2021-05-29 NOTE — Telephone Encounter (Signed)
Call placed to patient. Patient agrees to see ENT will need referral.  Routing to provider patient request.  Ricardo [name]

## 2021-06-02 ENCOUNTER — Other Ambulatory Visit: Payer: Self-pay | Admitting: Infectious Disease

## 2021-06-02 DIAGNOSIS — R591 Generalized enlarged lymph nodes: Secondary | ICD-10-CM

## 2021-06-02 NOTE — Telephone Encounter (Signed)
Patient has been advised his referral is being processed and he should hear something soon.

## 2021-06-04 DIAGNOSIS — J392 Other diseases of pharynx: Secondary | ICD-10-CM | POA: Insufficient documentation

## 2021-06-04 DIAGNOSIS — R1310 Dysphagia, unspecified: Secondary | ICD-10-CM | POA: Insufficient documentation

## 2021-06-04 DIAGNOSIS — R1313 Dysphagia, pharyngeal phase: Secondary | ICD-10-CM | POA: Diagnosis not present

## 2021-06-05 DIAGNOSIS — R221 Localized swelling, mass and lump, neck: Secondary | ICD-10-CM | POA: Diagnosis not present

## 2021-06-09 ENCOUNTER — Telehealth: Payer: Self-pay | Admitting: Hematology and Oncology

## 2021-06-09 NOTE — Telephone Encounter (Signed)
Canceled 6/22 scheduled Appt's w/Melissa - Per Melissa patient needs to be scheduled w/Medonc - Radonc after receiving Biopsy Results.  Biopsy performed last Thursday at the ENT

## 2021-06-11 ENCOUNTER — Other Ambulatory Visit: Payer: BC Managed Care – PPO

## 2021-06-11 ENCOUNTER — Ambulatory Visit: Payer: BC Managed Care – PPO | Admitting: Hematology and Oncology

## 2021-06-12 ENCOUNTER — Other Ambulatory Visit (HOSPITAL_COMMUNITY): Payer: Self-pay

## 2021-06-12 ENCOUNTER — Other Ambulatory Visit: Payer: Self-pay | Admitting: Infectious Disease

## 2021-06-12 DIAGNOSIS — B20 Human immunodeficiency virus [HIV] disease: Secondary | ICD-10-CM

## 2021-06-12 MED FILL — Metoprolol Succinate Tab ER 24HR 100 MG (Tartrate Equiv): ORAL | 30 days supply | Qty: 30 | Fill #2 | Status: AC

## 2021-06-12 MED FILL — Fenofibrate Tab 160 MG: ORAL | 90 days supply | Qty: 90 | Fill #1 | Status: AC

## 2021-06-12 MED FILL — Amitriptyline HCl Tab 25 MG: ORAL | 30 days supply | Qty: 30 | Fill #2 | Status: AC

## 2021-06-12 MED FILL — Rosuvastatin Calcium Tab 20 MG: ORAL | 30 days supply | Qty: 30 | Fill #2 | Status: AC

## 2021-06-13 ENCOUNTER — Other Ambulatory Visit (HOSPITAL_COMMUNITY): Payer: Self-pay

## 2021-06-13 MED ORDER — TRULICITY 4.5 MG/0.5ML ~~LOC~~ SOAJ
SUBCUTANEOUS | 3 refills | Status: DC
  Filled 2021-06-13: qty 2, 28d supply, fill #0
  Filled 2021-07-10 – 2021-07-14 (×2): qty 2, 28d supply, fill #1
  Filled 2021-08-06: qty 2, 28d supply, fill #2

## 2021-06-13 MED ORDER — DESCOVY 200-25 MG PO TABS
1.0000 | ORAL_TABLET | Freq: Every day | ORAL | 0 refills | Status: DC
  Filled 2021-06-13: qty 30, 30d supply, fill #0

## 2021-06-13 MED ORDER — ETRAVIRINE 200 MG PO TABS
ORAL_TABLET | ORAL | 0 refills | Status: DC
  Filled 2021-06-13: qty 60, 30d supply, fill #0

## 2021-06-16 ENCOUNTER — Other Ambulatory Visit (HOSPITAL_COMMUNITY): Payer: Self-pay

## 2021-06-16 ENCOUNTER — Other Ambulatory Visit: Payer: Self-pay | Admitting: Hematology and Oncology

## 2021-06-19 ENCOUNTER — Telehealth: Payer: Self-pay | Admitting: Oncology

## 2021-06-19 ENCOUNTER — Other Ambulatory Visit (HOSPITAL_COMMUNITY): Payer: Self-pay

## 2021-06-19 NOTE — Telephone Encounter (Signed)
Correction:  Appt 07/04/21 Nurse 9:00 am - Dr Orlene Erm 9:30 am - Labs 10:30 am - Dr Bobby Rumpf 11:00 am

## 2021-06-19 NOTE — Telephone Encounter (Signed)
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  Appt made for 07/04/21 Nurse 9:00 am - Dr Orlene Erm 9:30 am - Labs 10:00 am - Dr Bobby Rumpf 10:30 am

## 2021-06-24 ENCOUNTER — Other Ambulatory Visit (HOSPITAL_COMMUNITY): Payer: Self-pay

## 2021-06-30 ENCOUNTER — Telehealth: Payer: Self-pay

## 2021-06-30 NOTE — Telephone Encounter (Signed)
[facility] Patient Advocate Encounter  I spoke to patient insurance and they have a formulary change in [place] , Patient will need to take at least 2 months of the generic Truvada. They will not be able to do a PA on Des[place] if they do not see that he have tried Truvada generic.  Ileene Patrick, [facility] Patient [facility] Phone: [phone] Fax:  [phone]

## 2021-07-03 ENCOUNTER — Other Ambulatory Visit: Payer: Self-pay | Admitting: Oncology

## 2021-07-03 ENCOUNTER — Other Ambulatory Visit: Payer: Self-pay

## 2021-07-03 DIAGNOSIS — C01 Malignant neoplasm of base of tongue: Secondary | ICD-10-CM

## 2021-07-03 NOTE — Progress Notes (Deleted)
[facility]  [address] [place],  [place]  [address] [phone]  Clinic Day:  07/04/2021  Referring physician: Susan Yanik, MD   HISTORY OF PRESENT ILLNESS:  The patient is a 60 y.o. male  who I was asked to consult upon for base of tongue cancer.  His history dates back 4 months ago when he first began having dysphagia.  This gradually progressed over time.  What particularly concerned him was the development of left neck pain and swelling.  This led to CT scans being done, which revealed a left base of tongue mass and level 2 lymphadenopathy.  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  He comes in today to go over his biopsy and scan results, as well as their implications.  He believes he has lost 10 pounds over the past month.  Of note, this gentleman has never smoked.  PAST MEDICAL HISTORY:   Past Medical History:  Diagnosis Date   Abnormal liver function tests 11/16/2016   Diabetes mellitus type 2 in obese ([facility]) 11/20/2015   Diabetes mellitus without complication ([facility])    Diabetes type 2, uncontrolled ([facility])    HIV (human immunodeficiency virus infection) ([facility])    Hyperlipidemia    LVH (left ventricular hypertrophy)    Obesity 05/08/2019   Palpitations    Rash and nonspecific skin eruption 09/15/2016    PAST SURGICAL HISTORY:  Neck surgery, tonsillectomy  CURRENT MEDICATIONS:   Current Outpatient Medications  Medication Sig Dispense Refill   amitriptyline (ELAVIL) 25 MG tablet TAKE 1 TABLET BY MOUTH ONCE DAILY AT BEDTIME 30 tablet 4   Blood Glucose Monitoring Suppl (ONE TOUCH ULTRA 2) W/DEVICE KIT Use to check blood sugars 1/day. 1 each 0   diclofenac (VOLTAREN) 75 MG EC tablet Take 75 mg by mouth 2 (two) times daily.     Dulaglutide (TRULICITY) 4.5 MG/0.5ML SOPN Inject 1 pen under the skin once weekly as directed 2 mL 3   emtricitabine-tenofovir  AF (DESCOVY) 200-25 MG tablet TAKE 1 TABLET BY MOUTH DAILY. 30 tablet 0   Etravirine 200 MG TABS TAKE 2 TABLETS (400 MG TOTAL) BY MOUTH DAILY. 60 tablet 0   FARXIGA 10 MG TABS tablet Take 10 mg by mouth daily.     fenofibrate 160 MG tablet TAKE 1 TABLET BY MOUTH ONCE A DAY 90 tablet 1   glipiZIDE (GLUCOTROL XL) 5 MG 24 hr tablet Take 2 tablets by mouth daily keep 06/25/2021 appt 180 tablet 0   glucose blood (ONE TOUCH ULTRA TEST) test strip Use to check blood sugars three times daily. 100 each 11   lisinopril (ZESTRIL) 2.5 MG tablet Take 1 tablet (2.5 mg total) by mouth daily for kidney protection 90 tablet 1   metoprolol succinate (TOPROL-XL) 100 MG 24 hr tablet TAKE 1 TABLET BY MOUTH ONCE A DAY WITH OR IMMEDIATELY FOLLOWING A MEAL (NEEDS APPT) 30 tablet 4   rosuvastatin (CRESTOR) 20 MG tablet TAKE 1 TABLET BY MOUTH ONCE A DAY 30 tablet 4   No current facility-administered medications for this visit.    ALLERGIES:   Allergies  Allergen Reactions   Amoxicillin-Pot Clavulanate     FAMILY HISTORY:   Family History  Problem Relation Age of Onset   Coronary artery disease Father 32  His father died from metastatic pancreatic cancer.  His mother had a history of ovarian cancer, but ultimately died   from heart disease.  His paternal grandmother died from pancreatic cancer.    SOCIAL HISTORY:  The patient was born and raised in [place].  He lives in [place] with his wife of 22 years.  He has 4 children and 7 grandchildren.  He has been a truck driver for 35 years.  There is no history of alcohol or tobacco abuse.   REVIEW OF SYSTEMS:  Review of Systems  Constitutional:  Negative for fatigue, fever and unexpected weight change.  Respiratory:  Positive for cough. Negative for chest tightness, hemoptysis and shortness of breath.   Cardiovascular:  Negative for chest pain and palpitations.  Gastrointestinal:  Negative for abdominal distention, abdominal pain, blood in stool,  constipation, diarrhea, nausea and vomiting.  Genitourinary:  Negative for dysuria, frequency and hematuria.   Musculoskeletal:  Positive for arthralgias. Negative for back pain and myalgias.  Skin:  Negative for itching and rash.  Neurological:  Positive for headaches. Negative for dizziness and light-headedness.  Psychiatric/Behavioral:  Negative for depression and suicidal ideas. The patient is not nervous/anxious.     PHYSICAL EXAM:  Blood pressure 134/75, pulse 91, temperature 98.3 F (36.8 C), temperature source Oral, resp. rate 18, height 5' 10" (1.778 m), weight 224 lb 3.2 oz (101.7 kg), SpO2 97 %. Wt Readings from Last 3 Encounters:  07/04/21 224 lb 3.2 oz (101.7 kg)  05/08/19 250 lb (113.4 kg)  08/24/18 232 lb (105.2 kg)   Body mass index is 32.17 kg/m. Performance status (ECOG): 1 - Symptomatic but completely ambulatory  Physical Exam Constitutional:      Appearance: Normal appearance. He is not ill-appearing.  HENT:     Mouth/Throat:     Mouth: Mucous membranes are moist.     Pharynx: Oropharynx is clear. No oropharyngeal exudate or posterior oropharyngeal erythema.  Cardiovascular:     Rate and Rhythm: Normal rate and regular rhythm.     Heart sounds: No murmur heard.   No friction rub. No gallop.  Pulmonary:     Effort: Pulmonary effort is normal. No respiratory distress.     Breath sounds: Normal breath sounds. No wheezing, rhonchi or rales.  Chest:  Breasts:    Right: No axillary adenopathy or supraclavicular adenopathy.     Left: No axillary adenopathy or supraclavicular adenopathy.  Abdominal:     General: Bowel sounds are normal. There is no distension.     Palpations: Abdomen is soft. There is no mass.     Tenderness: no abdominal tenderness  Musculoskeletal:        General: No swelling.     Right lower leg: No edema.     Left lower leg: No edema.  Lymphadenopathy:     Cervical: Cervical adenopathy (3cm level 2 node palpated) present.     Right  cervical: No superficial cervical adenopathy.    Left cervical: Superficial cervical adenopathy (3 cm level 2 lymph node palpated) present.     Upper Body:     Right upper body: No supraclavicular or axillary adenopathy.     Left upper body: No supraclavicular or axillary adenopathy.     Lower Body: No right inguinal adenopathy. No left inguinal adenopathy.  Skin:    General: Skin is warm.     Coloration: Skin is not jaundiced.     Findings: No lesion or rash.  Neurological:     General: No focal deficit present.     Mental Status: He is alert and oriented to person, place, and time. Mental   status is at baseline.     Cranial Nerves: Cranial nerves are intact.  Psychiatric:        Mood and Affect: Mood normal.        Behavior: Behavior normal.        Thought Content: Thought content normal.   LABS:   CBC Latest Ref Rng & Units 07/04/2021 03/10/2021 03/28/2020  WBC - 5.7 4.2 4.6  Hemoglobin 13.5 - 17.5 14.0 15.2 15.0  Hematocrit 41 - 53 40(A) 44.3 43.7  Platelets 150 - 399 226 208 231   CMP Latest Ref Rng & Units 07/04/2021 03/10/2021 03/28/2020  Glucose 65 - 99 mg/dL - 115(H) 192(H)  BUN 4 - 21 13 23 16  Creatinine 0.6 - 1.3 0.7 1.13 0.97  Sodium 137 - 147 135(A) 139 137  Potassium 3.4 - 5.3 4.3 3.8 4.2  Chloride 99 - 108 104 106 105  CO2 13 - 22 21 23 25  Calcium 8.7 - 10.7 9.0 8.6 9.5  Total Protein 6.1 - 8.1 g/dL - 7.0 7.2  Total Bilirubin 0.2 - 1.2 mg/dL - 0.5 0.4  Alkaline Phos 25 - 125 84 - -  AST 14 - 40 45(A) 41(H) 31  ALT 10 - 40 32 31 31   ASSESSMENT & PLAN:  A 60 y.o. 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  For completeness, this gentleman needs a PET scan to ensure there is not occult evidence of distant metastasis.  If none is seen, this gentleman would receive concurrent chemoradiation, with the goal being to cure him of his disease.  Usually, HPV+ oral cancers portend a much better  prognosis than their HPV- counterparts.  His PET scan will be scheduled in the forthcoming week.  I will see him back a day later to go over these images and their implications.  The patient understands all the plans discussed today and is in agreement with them.  I do appreciate Dr Susan Yanik for his new consult.   Latanza Pfefferkorn A Tydarius Yawn, MD      

## 2021-07-04 ENCOUNTER — Inpatient Hospital Stay (INDEPENDENT_AMBULATORY_CARE_PROVIDER_SITE_OTHER): Payer: BC Managed Care – PPO | Admitting: Oncology

## 2021-07-04 ENCOUNTER — Inpatient Hospital Stay: Payer: BC Managed Care – PPO | Attending: Oncology

## 2021-07-04 ENCOUNTER — Encounter: Payer: Self-pay | Admitting: Oncology

## 2021-07-04 ENCOUNTER — Other Ambulatory Visit: Payer: Self-pay | Admitting: Oncology

## 2021-07-04 ENCOUNTER — Other Ambulatory Visit: Payer: Self-pay

## 2021-07-04 DIAGNOSIS — C01 Malignant neoplasm of base of tongue: Secondary | ICD-10-CM

## 2021-07-04 LAB — BASIC METABOLIC PANEL
BUN: 13 (ref 4–21)
CO2: 21 (ref 13–22)
Chloride: 104 (ref 99–108)
Creatinine: 0.7 (ref 0.6–1.3)
Glucose: 295
Potassium: 4.3 (ref 3.4–5.3)
Sodium: 135 — AB (ref 137–147)

## 2021-07-04 LAB — HEPATIC FUNCTION PANEL
ALT: 32 (ref 10–40)
AST: 45 — AB (ref 14–40)
Alkaline Phosphatase: 84 (ref 25–125)
Bilirubin, Total: 0.6

## 2021-07-04 LAB — COMPREHENSIVE METABOLIC PANEL
Albumin: 4.1 (ref 3.5–5.0)
Calcium: 9 (ref 8.7–10.7)

## 2021-07-04 LAB — CBC AND DIFFERENTIAL
HCT: 40 — AB (ref 41–53)
Hemoglobin: 14 (ref 13.5–17.5)
Neutrophils Absolute: 2.51
Platelets: 226 (ref 150–399)
WBC: 5.7

## 2021-07-04 LAB — CBC: RBC: 4.42 (ref 3.87–5.11)

## 2021-07-04 NOTE — Progress Notes (Signed)
[facility]  [address] [place],  [place]  [address] [phone]  Clinic Day:  07/04/2021  Referring physician: Rush Farmer, MD   HISTORY OF PRESENT ILLNESS:  The patient is a 60 y.o. male  who I was asked to consult upon for base of tongue cancer.  His history dates back 4 months ago when he first began having dysphagia.  This gradually progressed over time.  What particularly concerned him was the development of left neck pain and swelling.  This led to CT scans being done, which revealed a left base of tongue mass and level 2 lymphadenopathy.  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  He comes in today to go over his biopsy and scan results, as well as their implications.  He believes he has lost 10 pounds over the past month.  Of note, this gentleman has never smoked.  PAST MEDICAL HISTORY:   Past Medical History:  Diagnosis Date   Abnormal liver function tests 11/16/2016   Diabetes mellitus type 2 in obese ([place]) 11/20/2015   Diabetes mellitus without complication ([facility])    Diabetes type 2, uncontrolled ([facility])    HIV (human immunodeficiency virus infection) ([place])    Hyperlipidemia    LVH (left ventricular hypertrophy)    Obesity 05/08/2019   Palpitations    Rash and nonspecific skin eruption 09/15/2016    PAST SURGICAL HISTORY:  Neck surgery, tonsillectomy  CURRENT MEDICATIONS:   Current Outpatient Medications  Medication Sig Dispense Refill   amitriptyline (ELAVIL) 25 MG tablet TAKE 1 TABLET BY MOUTH ONCE DAILY AT BEDTIME 30 tablet 4   Blood Glucose Monitoring Suppl (ONE TOUCH ULTRA 2) W/DEVICE KIT Use to check blood sugars 1/day. 1 each 0   diclofenac (VOLTAREN) 75 MG EC tablet Take 75 mg by mouth 2 (two) times daily.     Dulaglutide (TRULICITY) 4.5 MM/3.8TR SOPN Inject 1 pen under the skin once weekly as directed 2 mL 3   emtricitabine-tenofovir  AF (DESCOVY) 200-25 MG tablet TAKE 1 TABLET BY MOUTH DAILY. 30 tablet 0   Etravirine 200 MG TABS TAKE 2 TABLETS (400 MG TOTAL) BY MOUTH DAILY. 60 tablet 0   FARXIGA 10 MG TABS tablet Take 10 mg by mouth daily.     fenofibrate 160 MG tablet TAKE 1 TABLET BY MOUTH ONCE A DAY 90 tablet 1   glipiZIDE (GLUCOTROL XL) 5 MG 24 hr tablet Take 2 tablets by mouth daily keep 06/25/2021 appt 180 tablet 0   glucose blood (ONE TOUCH ULTRA TEST) test strip Use to check blood sugars three times daily. 100 each 11   lisinopril (ZESTRIL) 2.5 MG tablet Take 1 tablet (2.5 mg total) by mouth daily for kidney protection 90 tablet 1   metoprolol succinate (TOPROL-XL) 100 MG 24 hr tablet TAKE 1 TABLET BY MOUTH ONCE A DAY WITH OR IMMEDIATELY FOLLOWING A MEAL (NEEDS APPT) 30 tablet 4   rosuvastatin (CRESTOR) 20 MG tablet TAKE 1 TABLET BY MOUTH ONCE A DAY 30 tablet 4   No current facility-administered medications for this visit.    ALLERGIES:   Allergies  Allergen Reactions   Amoxicillin-Pot Clavulanate     FAMILY HISTORY:   Family History  Problem Relation Age of Onset   Coronary artery disease Father 2  His father died from metastatic pancreatic cancer.  His mother had a history of ovarian cancer, but ultimately died  from heart disease.  His paternal grandmother died from pancreatic cancer.    SOCIAL HISTORY:  The patient was born and raised in [place], [place].  He lives in [place] with his wife of 22 years.  He has 4 children and 7 grandchildren.  He has been a Administrator for 35 years.  There is no history of alcohol or tobacco abuse.   REVIEW OF SYSTEMS:  Review of Systems  Constitutional:  Negative for fatigue, fever and unexpected weight change.  Respiratory:  Positive for cough. Negative for chest tightness, hemoptysis and shortness of breath.   Cardiovascular:  Negative for chest pain and palpitations.  Gastrointestinal:  Negative for abdominal distention, abdominal pain, blood in stool,  constipation, diarrhea, nausea and vomiting.  Genitourinary:  Negative for dysuria, frequency and hematuria.   Musculoskeletal:  Positive for arthralgias. Negative for back pain and myalgias.  Skin:  Negative for itching and rash.  Neurological:  Positive for headaches. Negative for dizziness and light-headedness.  Psychiatric/Behavioral:  Negative for depression and suicidal ideas. The patient is not nervous/anxious.     PHYSICAL EXAM:  Blood pressure 134/75, pulse 91, temperature 98.3 F (36.8 C), temperature source Oral, resp. rate 18, height _0  (1.778 m), weight 224 lb 3.2 oz (101.7 kg), SpO2 97 %. Wt Readings from Last 3 Encounters:  07/04/21 224 lb 3.2 oz (101.7 kg)  05/08/19 250 lb (113.4 kg)  08/24/18 232 lb (105.2 kg)   Body mass index is 32.17 kg/m. Performance status (ECOG): 1 - Symptomatic but completely ambulatory  Physical Exam Constitutional:      Appearance: Normal appearance. He is not ill-appearing.  HENT:     Mouth/Throat:     Mouth: Mucous membranes are moist.     Pharynx: Oropharynx is clear. No oropharyngeal exudate or posterior oropharyngeal erythema.  Cardiovascular:     Rate and Rhythm: Normal rate and regular rhythm.     Heart sounds: No murmur heard.   No friction rub. No gallop.  Pulmonary:     Effort: Pulmonary effort is normal. No respiratory distress.     Breath sounds: Normal breath sounds. No wheezing, rhonchi or rales.  Chest:  Breasts:    Right: No axillary adenopathy or supraclavicular adenopathy.     Left: No axillary adenopathy or supraclavicular adenopathy.  Abdominal:     General: Bowel sounds are normal. There is no distension.     Palpations: Abdomen is soft. There is no mass.     Tenderness: no abdominal tenderness  Musculoskeletal:        General: No swelling.     Right lower leg: No edema.     Left lower leg: No edema.  Lymphadenopathy:     Cervical: Cervical adenopathy (3cm level 2 node palpated) present.     Right  cervical: No superficial cervical adenopathy.    Left cervical: Superficial cervical adenopathy (3 cm level 2 lymph node palpated) present.     Upper Body:     Right upper body: No supraclavicular or axillary adenopathy.     Left upper body: No supraclavicular or axillary adenopathy.     Lower Body: No right inguinal adenopathy. No left inguinal adenopathy.  Skin:    General: Skin is warm.     Coloration: Skin is not jaundiced.     Findings: No lesion or rash.  Neurological:     General: No focal deficit present.     Mental Status: He is alert and oriented to person, place, and time. Mental  status is at baseline.     Cranial Nerves: Cranial nerves are intact.  Psychiatric:        Mood and Affect: Mood normal.        Behavior: Behavior normal.        Thought Content: Thought content normal.   LABS:   CBC Latest Ref Rng & Units 07/04/2021 03/10/2021 03/28/2020  WBC - 5.7 4.2 4.6  Hemoglobin 13.5 - 17.5 14.0 15.2 15.0  Hematocrit 41 - 53 40(A) 44.3 43.7  Platelets 150 - 399 226 208 231   CMP Latest Ref Rng & Units 07/04/2021 03/10/2021 03/28/2020  Glucose 65 - 99 mg/dL - 115(H) 192(H)  BUN 4 - _0 Creatinine 0.6 - 1.3 0.7 1.13 0.97  Sodium 137 - 147 135(A) 139 137  Potassium 3.4 - 5.3 4.3 3.8 4.2  Chloride 99 - 108 104 106 105  CO2 13 - _1 Calcium 8.7 - 10.7 9.0 8.6 9.5  Total Protein 6.1 - 8.1 g/dL - 7.0 7.2  Total Bilirubin 0.2 - 1.2 mg/dL - 0.5 0.4  Alkaline Phos 25 - 125 84 - -  AST 14 - 40 45(A) 41(H) 31  ALT 10 - 40 32 31 31   ASSESSMENT & PLAN:  A 60 y.o. 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  For completeness, this gentleman needs a PET scan to ensure there is not occult evidence of distant metastasis.  If none is seen, this gentleman would receive concurrent chemoradiation, with the goal being to cure him of his disease.  Usually, HPV+ oral cancers portend a much better  prognosis than their HPV- counterparts.  His PET scan will be scheduled in the forthcoming week.  I will see him back a day later to go over these images and their implications.  The patient understands all the plans discussed today and is in agreement with them.  I do appreciate Dr Rush Farmer for his new consult.   Shaketa Serafin Macarthur Critchley, MD

## 2021-07-08 ENCOUNTER — Other Ambulatory Visit (HOSPITAL_COMMUNITY): Payer: Self-pay

## 2021-07-08 DIAGNOSIS — C01 Malignant neoplasm of base of tongue: Secondary | ICD-10-CM | POA: Diagnosis not present

## 2021-07-10 ENCOUNTER — Telehealth: Payer: Self-pay

## 2021-07-10 ENCOUNTER — Other Ambulatory Visit (HOSPITAL_COMMUNITY): Payer: Self-pay

## 2021-07-10 ENCOUNTER — Other Ambulatory Visit: Payer: Self-pay | Admitting: Infectious Disease

## 2021-07-10 DIAGNOSIS — B20 Human immunodeficiency virus [HIV] disease: Secondary | ICD-10-CM

## 2021-07-10 DIAGNOSIS — Z452 Encounter for adjustment and management of vascular access device: Secondary | ICD-10-CM | POA: Diagnosis not present

## 2021-07-10 DIAGNOSIS — C01 Malignant neoplasm of base of tongue: Secondary | ICD-10-CM | POA: Diagnosis not present

## 2021-07-10 DIAGNOSIS — Z800 Family history of malignant neoplasm of digestive organs: Secondary | ICD-10-CM | POA: Diagnosis not present

## 2021-07-10 DIAGNOSIS — Z21 Asymptomatic human immunodeficiency virus [HIV] infection status: Secondary | ICD-10-CM | POA: Diagnosis not present

## 2021-07-10 DIAGNOSIS — I10 Essential (primary) hypertension: Secondary | ICD-10-CM | POA: Diagnosis not present

## 2021-07-10 DIAGNOSIS — E7800 Pure hypercholesterolemia, unspecified: Secondary | ICD-10-CM | POA: Diagnosis not present

## 2021-07-10 DIAGNOSIS — Z79899 Other long term (current) drug therapy: Secondary | ICD-10-CM | POA: Diagnosis not present

## 2021-07-10 DIAGNOSIS — E119 Type 2 diabetes mellitus without complications: Secondary | ICD-10-CM | POA: Diagnosis not present

## 2021-07-10 MED ORDER — DESCOVY 200-25 MG PO TABS
1.0000 | ORAL_TABLET | Freq: Every day | ORAL | 4 refills | Status: DC
  Filled 2021-07-10 – 2021-07-14 (×2): qty 30, 30d supply, fill #0
  Filled 2021-08-06: qty 30, 30d supply, fill #1
  Filled 2021-09-03: qty 30, 30d supply, fill #2
  Filled 2021-10-01 – 2021-10-22 (×3): qty 30, 30d supply, fill #3
  Filled 2021-11-18: qty 30, 30d supply, fill #4

## 2021-07-10 MED ORDER — FENOFIBRATE 160 MG PO TABS
160.0000 mg | ORAL_TABLET | Freq: Every day | ORAL | 1 refills | Status: DC
  Filled 2021-07-10 (×2): qty 90, 90d supply, fill #0

## 2021-07-10 MED ORDER — LISINOPRIL 2.5 MG PO TABS
ORAL_TABLET | ORAL | 1 refills | Status: DC
  Filled 2021-07-10 (×2): qty 90, 90d supply, fill #0

## 2021-07-10 MED ORDER — ETRAVIRINE 200 MG PO TABS
ORAL_TABLET | ORAL | 4 refills | Status: DC
  Filled 2021-07-10: qty 60, fill #0
  Filled 2021-07-14: qty 60, 30d supply, fill #0
  Filled 2021-08-06: qty 60, 30d supply, fill #1
  Filled 2021-09-03: qty 60, 30d supply, fill #2
  Filled 2021-10-01: qty 60, 30d supply, fill #3
  Filled 2021-11-18 – 2021-12-03 (×4): qty 60, 30d supply, fill #4

## 2021-07-10 MED ORDER — LISINOPRIL 2.5 MG PO TABS
ORAL_TABLET | ORAL | 1 refills | Status: DC
  Filled 2021-07-10: qty 90, 90d supply, fill #0

## 2021-07-10 MED ORDER — GLIPIZIDE ER 5 MG PO TB24
ORAL_TABLET | ORAL | 0 refills | Status: DC
  Filled 2021-07-10: qty 180, 90d supply, fill #0

## 2021-07-10 MED FILL — Rosuvastatin Calcium Tab 20 MG: ORAL | 30 days supply | Qty: 30 | Fill #3 | Status: CN

## 2021-07-10 MED FILL — Amitriptyline HCl Tab 25 MG: ORAL | 30 days supply | Qty: 30 | Fill #3 | Status: CN

## 2021-07-10 MED FILL — Metoprolol Succinate Tab ER 24HR 100 MG (Tartrate Equiv): ORAL | 30 days supply | Qty: 30 | Fill #3 | Status: CN

## 2021-07-10 NOTE — Telephone Encounter (Signed)
Attempted to call patient to schedule overdue visit with Dr. Tommy Medal and labs. Not able to reach him at this time. Left voicemail requesting call back.  Leatrice Jewels, RMA

## 2021-07-11 ENCOUNTER — Other Ambulatory Visit (HOSPITAL_COMMUNITY): Payer: Self-pay

## 2021-07-14 ENCOUNTER — Other Ambulatory Visit (HOSPITAL_COMMUNITY): Payer: Self-pay

## 2021-07-14 MED FILL — Amitriptyline HCl Tab 25 MG: ORAL | 30 days supply | Qty: 30 | Fill #3 | Status: AC

## 2021-07-14 MED FILL — Metoprolol Succinate Tab ER 24HR 100 MG (Tartrate Equiv): ORAL | 30 days supply | Qty: 30 | Fill #3 | Status: AC

## 2021-07-14 MED FILL — Rosuvastatin Calcium Tab 20 MG: ORAL | 30 days supply | Qty: 30 | Fill #3 | Status: AC

## 2021-07-22 ENCOUNTER — Telehealth: Payer: Self-pay | Admitting: Oncology

## 2021-07-22 NOTE — Telephone Encounter (Signed)
Patient called to get [place] patient the Address to the [facility] and Scheduling's Phone Number

## 2021-07-22 NOTE — Telephone Encounter (Signed)
Ok will do.

## 2021-07-23 DIAGNOSIS — C029 Malignant neoplasm of tongue, unspecified: Secondary | ICD-10-CM | POA: Diagnosis not present

## 2021-07-23 DIAGNOSIS — R590 Localized enlarged lymph nodes: Secondary | ICD-10-CM | POA: Diagnosis not present

## 2021-07-23 DIAGNOSIS — I700 Atherosclerosis of aorta: Secondary | ICD-10-CM | POA: Diagnosis not present

## 2021-07-23 DIAGNOSIS — C01 Malignant neoplasm of base of tongue: Secondary | ICD-10-CM | POA: Diagnosis not present

## 2021-07-23 DIAGNOSIS — R911 Solitary pulmonary nodule: Secondary | ICD-10-CM | POA: Diagnosis not present

## 2021-07-23 DIAGNOSIS — E041 Nontoxic single thyroid nodule: Secondary | ICD-10-CM | POA: Diagnosis not present

## 2021-07-27 NOTE — Progress Notes (Signed)
[facility]  [address] [place],  [place]  [address] [phone]  Clinic Day:  07/28/2021  Referring physician: Rush Farmer, MD   HISTORY OF PRESENT ILLNESS:  The patient is a 60 y.o. 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  He comes in today to go over his PET scan images and their implications.  Since his last visit, the patient has been doing okay.  Odynophagia and left neck pain remain issues, related to his known disease.    PHYSICAL EXAM:  Blood pressure 134/75, pulse 91, temperature 98.3 F (36.8 C), temperature source Oral, resp. rate 18, height '5\' 10"'$  (1.778 m), weight 224 lb 3.2 oz (101.7 kg), SpO2 97 %. Wt Readings from Last 3 Encounters:  07/04/21 224 lb 3.2 oz (101.7 kg)  05/08/19 250 lb (113.4 kg)  08/24/18 232 lb (105.2 kg)   Body mass index is 32.17 kg/m. Performance status (ECOG): 1 - Symptomatic but completely ambulatory  Physical Exam Constitutional:      Appearance: Normal appearance. He is not ill-appearing.  HENT:     Mouth/Throat:     Mouth: Mucous membranes are moist.     Pharynx: Oropharynx is clear. No oropharyngeal exudate or posterior oropharyngeal erythema.  Cardiovascular:     Rate and Rhythm: Normal rate and regular rhythm.     Heart sounds: No murmur heard.   No friction rub. No gallop.  Pulmonary:     Effort: Pulmonary effort is normal. No respiratory distress.     Breath sounds: Normal breath sounds. No wheezing, rhonchi or rales.  Chest:  Breasts:    Right: No axillary adenopathy or supraclavicular adenopathy.     Left: No axillary adenopathy or supraclavicular adenopathy.  Abdominal:     General: Bowel sounds are normal. There is no distension.     Palpations: Abdomen is soft. There is no mass.     Tenderness: no abdominal tenderness  Musculoskeletal:        General: No swelling.     Right lower leg: No edema.     Left lower leg: No edema.  Lymphadenopathy:      Cervical: Cervical adenopathy (3cm level 2 node palpated) present.     Right cervical: No superficial cervical adenopathy.    Left cervical: Superficial cervical adenopathy (3 cm level 2 lymph node palpated) present.     Upper Body:     Right upper body: No supraclavicular or axillary adenopathy.     Left upper body: No supraclavicular or axillary adenopathy.     Lower Body: No right inguinal adenopathy. No left inguinal adenopathy.  Skin:    General: Skin is warm.     Coloration: Skin is not jaundiced.     Findings: No lesion or rash.  Neurological:     General: No focal deficit present.     Mental Status: He is alert and oriented to person, place, and time. Mental status is at baseline.     Cranial Nerves: Cranial nerves are intact.  Psychiatric:        Mood and Affect: Mood normal.        Behavior: Behavior normal.        Thought Content: Thought content normal.   SCANS:  His PET scan images revealed the following: FINDINGS: Mediastinal blood pool activity: SUV max 2.85  Liver activity: SUV max NA  NECK: FDG avid lesions involving the left base of tongue and palatine tonsil extending into the  supraglottic region has an SUV max of 9.11, image 89/3.  FDG avid left level 2 lymph node measures 2.7 x 2.1 cm and has an SUV max 16.85.  Incidental CT findings: There is a FDG avid nodule localizing to the isthmus of the thyroid gland which measures approximately 9 mm and has an SUV max of 4.27, image 124/3. Recommend thyroid US and biopsy (ref: J Am Coll Radiol. 2015 Feb;12(2): 143-50).  CHEST: No hypermetabolic mediastinal or hilar nodes. No suspicious pulmonary nodules on the CT scan.  Incidental CT findings: 6 mm nodule in the posterior left upper lobe is too small to characterize by PET-CT, image 135/3. mild aortic atherosclerosis.  ABDOMEN/PELVIS: No abnormal FDG uptake identified within the liver, pancreas, spleen, or adrenal glands. No FDG avid abdominopelvic lymph  nodes.  Incidental CT findings: Aortic atherosclerosis. Status post gastrostomy tube placement  SKELETON: No focal hypermetabolic activity to suggest skeletal metastasis.  Incidental CT findings: none  IMPRESSION: 1. There is intense FDG uptake associated with the left base of tongue and left tonsillar mass compatible with primary head neck neoplasm. 2. Single enlarged left level 2 FDG avid lymph node compatible with metastatic adenopathy. 3. FDG avid nodule in the expected location of the thyroid isthmus. Recommend thyroid US and biopsy (ref: J Am Coll Radiol. 2015 Feb;12(2): 143-50). 4. 6 mm posterior left upper lobe lung nodule is technically too small to characterize by PET-CT. Attention on follow-up imaging is advised.   ASSESSMENT & PLAN:  A 60 y.o. 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  In clinic today, I went over his PET scan images with him, for which he could see he has no evidence of distant metastasis.  Based upon this, chemoradiation will commence within the next 10 days to treat his disease.  It will consist of 3 cycles of cisplatin, which will be repeated every 3 weeks.  He was made aware of the side effects that go along with cisplatin, including nausea, ototoxicity and electrolyte changes.  Of note, a hypermetabolic focus of activity was seen in his thyroid isthmus.  As this finding may represent early thyroid cancer, I will arrange for him to undergo an FNA of the lesion in question for further evaluation.  The results will not change the treatment plan for his left base of tongue cancer, which is being given with curative intent.  I will see him back in a few weeks before he heads into his 2nd scheduled cycle of cisplatin, as well as to review the pathology from his thyroid biopsy.  The patient understands all the plans discussed today and is in agreement with them.  Eldrige Pitkin Macarthur Critchley, MD

## 2021-07-28 ENCOUNTER — Other Ambulatory Visit: Payer: Self-pay | Admitting: Oncology

## 2021-07-28 ENCOUNTER — Inpatient Hospital Stay: Payer: BC Managed Care – PPO

## 2021-07-28 ENCOUNTER — Other Ambulatory Visit: Payer: Self-pay | Admitting: Hematology and Oncology

## 2021-07-28 ENCOUNTER — Inpatient Hospital Stay: Payer: BC Managed Care – PPO | Attending: Oncology | Admitting: Oncology

## 2021-07-28 ENCOUNTER — Telehealth: Payer: Self-pay | Admitting: Oncology

## 2021-07-28 VITALS — BP 123/74 | HR 102 | Temp 98.7°F | Resp 16 | Ht 70.0 in | Wt 217.7 lb

## 2021-07-28 DIAGNOSIS — C01 Malignant neoplasm of base of tongue: Secondary | ICD-10-CM | POA: Diagnosis not present

## 2021-07-28 DIAGNOSIS — Z5111 Encounter for antineoplastic chemotherapy: Secondary | ICD-10-CM | POA: Insufficient documentation

## 2021-07-28 DIAGNOSIS — J392 Other diseases of pharynx: Secondary | ICD-10-CM | POA: Diagnosis not present

## 2021-07-28 LAB — BASIC METABOLIC PANEL
BUN: 25 — AB (ref 4–21)
CO2: 23 — AB (ref 13–22)
Chloride: 102 (ref 99–108)
Creatinine: 1.2 (ref 0.6–1.3)
Glucose: 243
Potassium: 4.2 (ref 3.4–5.3)
Sodium: 138 (ref 137–147)

## 2021-07-28 LAB — COMPREHENSIVE METABOLIC PANEL
Albumin: 4.5 (ref 3.5–5.0)
Calcium: 11.3 — AB (ref 8.7–10.7)

## 2021-07-28 LAB — HEPATIC FUNCTION PANEL
ALT: 27 (ref 10–40)
AST: 36 (ref 14–40)
Alkaline Phosphatase: 73 (ref 25–125)
Bilirubin, Total: 0.4

## 2021-07-28 LAB — CBC: RBC: 4.92 (ref 3.87–5.11)

## 2021-07-28 LAB — CBC AND DIFFERENTIAL
HCT: 45 (ref 41–53)
Hemoglobin: 15.3 (ref 13.5–17.5)
Neutrophils Absolute: 3.82
Platelets: 295 (ref 150–399)
WBC: 7.8

## 2021-07-28 LAB — MAGNESIUM: Magnesium: 2.2

## 2021-07-28 MED ORDER — OXYCODONE HCL 5 MG PO TABS: 5.0000 mg | ORAL_TABLET | ORAL | 0 refills | Status: DC | PRN

## 2021-07-28 NOTE — Progress Notes (Signed)
START ON PATHWAY REGIMEN - Head and Neck     A cycle is every 21 days:     Cisplatin   **Always confirm dose/schedule in your pharmacy ordering system**  Patient Characteristics: Oropharynx, HPV Positive, Preoperative or Nonsurgical Candidate (Clinical Staging), cT0-4, cN1-3 or cT3-4, cN0 Disease Classification: Oropharynx HPV Status: Positive (+) Therapeutic Status: Preoperative or Nonsurgical Candidate (Clinical Staging) AJCC T Category: cT2 AJCC 8 Stage Grouping: I AJCC N Category: cN1 AJCC M Category: cM0 Intent of Therapy: Curative Intent, Discussed with Patient

## 2021-07-28 NOTE — Telephone Encounter (Signed)
Waiting Auth for Chemo before scheduling (no plans at this time).  Informed patient we would call w/Appt's

## 2021-07-29 ENCOUNTER — Other Ambulatory Visit: Payer: Self-pay | Admitting: Hematology and Oncology

## 2021-07-29 ENCOUNTER — Telehealth: Payer: Self-pay | Admitting: Oncology

## 2021-07-29 ENCOUNTER — Inpatient Hospital Stay (INDEPENDENT_AMBULATORY_CARE_PROVIDER_SITE_OTHER): Payer: BC Managed Care – PPO | Admitting: Hematology and Oncology

## 2021-07-29 ENCOUNTER — Encounter: Payer: Self-pay | Admitting: Oncology

## 2021-07-29 VITALS — BP 111/63 | HR 85 | Temp 98.4°F | Resp 16 | Ht 70.0 in | Wt 217.2 lb

## 2021-07-29 DIAGNOSIS — C01 Malignant neoplasm of base of tongue: Secondary | ICD-10-CM

## 2021-07-29 DIAGNOSIS — Z5111 Encounter for antineoplastic chemotherapy: Secondary | ICD-10-CM | POA: Diagnosis not present

## 2021-07-29 MED ORDER — SODIUM CHLORIDE 0.9% FLUSH
10.0000 mL | Freq: Once | INTRAVENOUS | Status: AC | PRN
  Administered 2021-07-29: 10 mL
  Filled 2021-07-29: qty 10

## 2021-07-29 MED ORDER — ONDANSETRON HCL 4 MG PO TABS: 4.0000 mg | ORAL_TABLET | ORAL | 3 refills | Status: DC | PRN

## 2021-07-29 MED ORDER — HEPARIN SOD (PORK) LOCK FLUSH 100 UNIT/ML IV SOLN
500.0000 [IU] | Freq: Once | INTRAVENOUS | Status: AC | PRN
  Administered 2021-07-29: 500 [IU]
  Filled 2021-07-29: qty 5

## 2021-07-29 MED ORDER — PROCHLORPERAZINE MALEATE 10 MG PO TABS: 10.0000 mg | ORAL_TABLET | Freq: Four times a day (QID) | ORAL | 3 refills | Status: DC | PRN

## 2021-07-29 MED ORDER — SODIUM CHLORIDE 0.9 % IV SOLN
Freq: Once | INTRAVENOUS | Status: AC
  Filled 2021-07-29: qty 250

## 2021-07-29 NOTE — Addendum Note (Signed)
Addended by: Dayton Scrape on: 07/29/2021 01:48 PM   Modules accepted: Level of Service

## 2021-07-29 NOTE — Progress Notes (Addendum)
The patient is a with newly diagnosed malignant neoplasm of base of tongue.   Patient presents to clinic today for chemotherapy education and palliative care consult.  We will start cisplatin with concurrent radiation therapy.   We will send in prescriptions for prochlorperazine and ondansetron.  The patient verbalizes understanding of and agreement to the plan as discussed today.  Provided general information including the following: 1.  Date of education: 07/29/2021 2.  Physician name: Dr. Bobby Rumpf 3.  Diagnosis: Malignant neoplasm of base of tongue 4.  Stage: Stage I 5.  Curative  6.  Chemotherapy plan including drugs and how often: Cisplatin IV every 3 weeks x 3 cycles 7.  Start date: Pending authorization 8.  Other referrals: None at this time 9.  The patient is to call our office with any questions or concerns.  Our office number [phone], if after hours or on the weekend, call the same number and wait for the answering service.  There is always an oncologist on call 10.  Medications prescribed: Ondansetron, Prochlorperazine 11.  The patient has verbalized understanding of the treatment plan and has no barriers to adherence or understanding.  Obtained signed consent from patient.  Discussed symptoms including 1.  Low blood counts including red blood cells, white blood cells and platelets. 2. Infection including to avoid large crowds, wash hands frequently, and stay away from people who were sick.  If fever develops of 100.4 or higher, call our office. 3.  Mucositis-given instructions on mouth rinse (baking soda and salt mixture).  Keep mouth clean.  Use soft bristle toothbrush.  If mouth sores develop, call our clinic. 4.  Nausea/vomiting-gave prescriptions for ondansetron 4 mg every 4 hours as needed for nausea, may take around the clock if persistent.  Compazine 10 mg every 6 hours, may take around the clock if persistent. 5.  Diarrhea-use over-the-counter Imodium.  Call clinic if not  controlled. 6.  Constipation-use senna, 1 to 2 tablets twice a day.  If no BM in 2 to 3 days call the clinic. 7.  Loss of appetite-try to eat small meals every 2-3 hours.  Call clinic if not eating. 8.  Taste changes-zinc 500 mg daily.  If becomes severe call clinic. 9.  Alcoholic beverages. 10.  Drink 2 to 3 quarts of water per day. 11.  Peripheral neuropathy-patient to call if numbness or tingling in hands or feet is persistent  Neulasta-will be given 24 to 48 hours after chemotherapy.  Gave information sheet on bone and joint pain.  Use Claritin or Pepcid.  May use ibuprofen or Aleve.  Call if symptoms persist or are unbearable.  Gave information on the supportive care team and how to contact them regarding services.  Discussed advanced directives.  The patient does not have their advanced directives but will look at the copy provided in their notebook and will call with any questions. [employer] Social worker Advanced directives  Answered questions to patient satisfaction.  Patient is to call with any further questions or concerns.   Ricardo Scrape, FNP- [facility]

## 2021-07-29 NOTE — Addendum Note (Signed)
Addended byGeorgette Shell on: 07/29/2021 12:24 PM   Modules accepted: Orders

## 2021-07-29 NOTE — Telephone Encounter (Signed)
Called to complete scheduling of Lab - Infusion Appt's for Aug/Sept

## 2021-07-31 ENCOUNTER — Telehealth: Payer: Self-pay | Admitting: Oncology

## 2021-07-31 DIAGNOSIS — C01 Malignant neoplasm of base of tongue: Secondary | ICD-10-CM | POA: Diagnosis not present

## 2021-07-31 NOTE — Telephone Encounter (Signed)
Called patient to make sure he knew about his Appt's on 8/15 - 8/16 (he was aware)

## 2021-08-01 DIAGNOSIS — C01 Malignant neoplasm of base of tongue: Secondary | ICD-10-CM | POA: Diagnosis not present

## 2021-08-01 DIAGNOSIS — Z510 Encounter for antineoplastic radiation therapy: Secondary | ICD-10-CM | POA: Diagnosis not present

## 2021-08-01 DIAGNOSIS — E041 Nontoxic single thyroid nodule: Secondary | ICD-10-CM | POA: Diagnosis not present

## 2021-08-04 ENCOUNTER — Encounter: Payer: Self-pay | Admitting: Oncology

## 2021-08-04 ENCOUNTER — Encounter: Payer: Self-pay | Admitting: Hematology and Oncology

## 2021-08-04 ENCOUNTER — Inpatient Hospital Stay: Payer: BC Managed Care – PPO

## 2021-08-04 DIAGNOSIS — C01 Malignant neoplasm of base of tongue: Secondary | ICD-10-CM | POA: Diagnosis not present

## 2021-08-04 DIAGNOSIS — Z510 Encounter for antineoplastic radiation therapy: Secondary | ICD-10-CM | POA: Diagnosis not present

## 2021-08-04 LAB — HEPATIC FUNCTION PANEL
ALT: 18 (ref 10–40)
AST: 33 (ref 14–40)
Alkaline Phosphatase: 59 (ref 25–125)
Bilirubin, Total: 0.3

## 2021-08-04 LAB — COMPREHENSIVE METABOLIC PANEL
Albumin: 4.1 (ref 3.5–5.0)
Calcium: 9.1 (ref 8.7–10.7)

## 2021-08-04 LAB — BASIC METABOLIC PANEL
BUN: 22 — AB (ref 4–21)
CO2: 20 (ref 13–22)
Chloride: 108 (ref 99–108)
Creatinine: 0.8 (ref 0.6–1.3)
Glucose: 147
Potassium: 3.9 (ref 3.4–5.3)
Sodium: 138 (ref 137–147)

## 2021-08-04 LAB — CBC: RBC: 4.35 (ref 3.87–5.11)

## 2021-08-04 LAB — CBC AND DIFFERENTIAL
HCT: 39 — AB (ref 41–53)
Hemoglobin: 13.5 (ref 13.5–17.5)
Neutrophils Absolute: 1.85
Platelets: 250 (ref 150–399)
WBC: 4.4

## 2021-08-04 MED FILL — Cisplatin Inj 100 MG/100ML (1 MG/ML): INTRAVENOUS | Qty: 221 | Status: AC

## 2021-08-04 MED FILL — Fosaprepitant Dimeglumine For IV Infusion 150 MG (Base Eq): INTRAVENOUS | Qty: 5 | Status: AC

## 2021-08-04 MED FILL — Dexamethasone Sodium Phosphate Inj 100 MG/10ML: INTRAMUSCULAR | Qty: 1 | Status: AC

## 2021-08-04 NOTE — Progress Notes (Signed)
..  Pharmacist Chemotherapy Monitoring - Initial Assessment    Anticipated start date: 08/05/21  The following has been reviewed per standard work regarding the patient's treatment regimen: The patient's diagnosis, treatment plan and drug doses, and organ/hematologic function Lab orders and baseline tests specific to treatment regimen  The treatment plan start date, drug sequencing, and pre-medications Prior authorization status  Patient's documented medication list, including drug-drug interaction screen and prescriptions for anti-emetics and supportive care specific to the treatment regimen The drug concentrations, fluid compatibility, administration routes, and timing of the medications to be used The patient's access for treatment and lifetime cumulative dose history, if applicable  The patient's medication allergies and previous infusion related reactions, if applicable   Changes made to treatment plan:  Changed IVFs  Follow up needed:  N/A   Juanetta Beets, [name], 08/04/2021  2:07 PM

## 2021-08-05 ENCOUNTER — Other Ambulatory Visit: Payer: Self-pay

## 2021-08-05 ENCOUNTER — Other Ambulatory Visit: Payer: Self-pay | Admitting: Hematology and Oncology

## 2021-08-05 ENCOUNTER — Inpatient Hospital Stay: Payer: BC Managed Care – PPO

## 2021-08-05 ENCOUNTER — Other Ambulatory Visit (HOSPITAL_COMMUNITY): Payer: Self-pay

## 2021-08-05 VITALS — BP 126/72 | HR 87 | Temp 98.2°F | Resp 18 | Ht 70.0 in | Wt 217.0 lb

## 2021-08-05 DIAGNOSIS — C01 Malignant neoplasm of base of tongue: Secondary | ICD-10-CM | POA: Diagnosis not present

## 2021-08-05 DIAGNOSIS — Z510 Encounter for antineoplastic radiation therapy: Secondary | ICD-10-CM | POA: Diagnosis not present

## 2021-08-05 DIAGNOSIS — Z5111 Encounter for antineoplastic chemotherapy: Secondary | ICD-10-CM | POA: Diagnosis not present

## 2021-08-05 MED ORDER — SODIUM CHLORIDE 0.9% FLUSH
10.0000 mL | INTRAVENOUS | Status: DC | PRN
  Administered 2021-08-05: 10 mL

## 2021-08-05 MED ORDER — ACETAMINOPHEN 325 MG PO TABS
ORAL_TABLET | ORAL | Status: AC
  Filled 2021-08-05: qty 2

## 2021-08-05 MED ORDER — SODIUM CHLORIDE 0.9 % IV SOLN: Freq: Once | INTRAVENOUS | Status: AC

## 2021-08-05 MED ORDER — POTASSIUM CHLORIDE IN NACL 20-0.9 MEQ/L-% IV SOLN
Freq: Once | INTRAVENOUS | Status: AC
  Filled 2021-08-05: qty 1000

## 2021-08-05 MED ORDER — SODIUM CHLORIDE 0.9 % IV SOLN
150.0000 mg | Freq: Once | INTRAVENOUS | Status: AC
  Administered 2021-08-05: 150 mg via INTRAVENOUS
  Filled 2021-08-05: qty 150

## 2021-08-05 MED ORDER — PALONOSETRON HCL INJECTION 0.25 MG/5ML
0.2500 mg | Freq: Once | INTRAVENOUS | Status: AC
  Administered 2021-08-05: 0.25 mg via INTRAVENOUS
  Filled 2021-08-05: qty 5

## 2021-08-05 MED ORDER — HEPARIN SOD (PORK) LOCK FLUSH 100 UNIT/ML IV SOLN
500.0000 [IU] | Freq: Once | INTRAVENOUS | Status: AC | PRN
  Administered 2021-08-05: 500 [IU]

## 2021-08-05 MED ORDER — ACETAMINOPHEN 325 MG PO TABS
650.0000 mg | ORAL_TABLET | Freq: Four times a day (QID) | ORAL | Status: DC | PRN
  Administered 2021-08-05: 650 mg via ORAL

## 2021-08-05 MED ORDER — SODIUM CHLORIDE 0.9 % IV SOLN
10.0000 mg | Freq: Once | INTRAVENOUS | Status: AC
  Administered 2021-08-05: 10 mg via INTRAVENOUS
  Filled 2021-08-05: qty 10

## 2021-08-05 MED ORDER — MAGNESIUM SULFATE 2 GM/50ML IV SOLN
2.0000 g | Freq: Once | INTRAVENOUS | Status: AC
  Administered 2021-08-05: 2 g via INTRAVENOUS
  Filled 2021-08-05: qty 50

## 2021-08-05 MED ORDER — SODIUM CHLORIDE 0.9 % IV SOLN
100.0000 mg/m2 | Freq: Once | INTRAVENOUS | Status: AC
  Administered 2021-08-05: 221 mg via INTRAVENOUS
  Filled 2021-08-05: qty 200

## 2021-08-05 NOTE — Patient Instructions (Signed)
Cisplatin injection What is this medication? CISPLATIN (SIS pla tin) is a chemotherapy drug. It targets fast dividing cells, like cancer cells, and causes these cells to die. This medicine is used totreat many types of cancer like bladder, ovarian, and testicular cancers. This medicine may be used for other purposes; ask your health care provider orpharmacist if you have questions. COMMON BRAND NAME(S): Platinol, Platinol -AQ What should I tell my care team before I take this medication? They need to know if you have any of these conditions: eye disease, vision problems hearing problems kidney disease low blood counts, like white cells, platelets, or red blood cells tingling of the fingers or toes, or other nerve disorder an unusual or allergic reaction to cisplatin, carboplatin, oxaliplatin, other medicines, foods, dyes, or preservatives pregnant or trying to get pregnant breast-feeding How should I use this medication? This drug is given as an infusion into a vein. It is administered in a hospitalor clinic by a specially trained health care professional. Talk to your pediatrician regarding the use of this medicine in children.Special care may be needed. Overdosage: If you think you have taken too much of this medicine contact apoison control center or emergency room at once. NOTE: This medicine is only for you. Do not share this medicine with others. What if I miss a dose? It is important not to miss a dose. Call your doctor or health careprofessional if you are unable to keep an appointment. What may interact with this medication? This medicine may interact with the following medications: foscarnet certain antibiotics like amikacin, gentamicin, neomycin, polymyxin B, streptomycin, tobramycin, vancomycin This list may not describe all possible interactions. Give your health care provider a list of all the medicines, herbs, non-prescription drugs, or dietary supplements you use. Also tell  them if you smoke, drink alcohol, or use illegaldrugs. Some items may interact with your medicine. What should I watch for while using this medication? Your condition will be monitored carefully while you are receiving this medicine. You will need important blood work done while you are taking thismedicine. This drug may make you feel generally unwell. This is not uncommon, as chemotherapy can affect healthy cells as well as cancer cells. Report any side effects. Continue your course of treatment even though you feel ill unless yourdoctor tells you to stop. This medicine may increase your risk of getting an infection. Call your healthcare professional for advice if you get a fever, chills, or sore throat, or other symptoms of a cold or flu. Do not treat yourself. Try to avoid beingaround people who are sick. Avoid taking medicines that contain aspirin, acetaminophen, ibuprofen, naproxen, or ketoprofen unless instructed by your healthcare professional.These medicines may hide a fever. This medicine may increase your risk to bruise or bleed. Call your doctor orhealth care professional if you notice any unusual bleeding. Be careful brushing and flossing your teeth or using a toothpick because you may get an infection or bleed more easily. If you have any dental work done,tell your dentist you are receiving this medicine. Do not become pregnant while taking this medicine or for 14 months after stopping it. Women should inform their healthcare professional if they wish to become pregnant or think they might be pregnant. Men should not father a child while taking this medicine and for 11 months after stopping it. There is potential for serious side effects to an unborn child. Talk to your healthcareprofessional for more information. Do not breast-feed an infant while taking this medicine. This  medicine has caused ovarian failure in some women. This medicine may make it more difficult to get pregnant. Talk to  your healthcare professional if [place] concerned about your fertility. This medicine has caused decreased sperm counts in some men. This may make it more difficult to father a child. Talk to your healthcare professional if [place] concerned about your fertility. Drink fluids as directed while you are taking this medicine. This will helpprotect your kidneys. Call your doctor or health care professional if you get diarrhea. Do not treatyourself. What side effects may I notice from receiving this medication? Side effects that you should report to your doctor or health care professionalas soon as possible: allergic reactions like skin rash, itching or hives, swelling of the face, lips, or tongue blurred vision changes in vision decreased hearing or ringing of the ears nausea, vomiting pain, redness, or irritation at site where injected pain, tingling, numbness in the hands or feet signs and symptoms of bleeding such as bloody or black, tarry stools; red or dark brown urine; spitting up blood or brown material that looks like coffee grounds; red spots on the skin; unusual bruising or bleeding from the eyes, gums, or nose signs and symptoms of infection like fever; chills; cough; sore throat; pain or trouble passing urine signs and symptoms of kidney injury like trouble passing urine or change in the amount of urine signs and symptoms of low red blood cells or anemia such as unusually weak or tired; feeling faint or lightheaded; falls; breathing problems Side effects that usually do not require medical attention (report to yourdoctor or health care professional if they continue or are bothersome): loss of appetite mouth sores muscle cramps This list may not describe all possible side effects. Call your doctor for medical advice about side effects. You may report side effects to FDA at1-800-FDA-1088. Where should I keep my medication? This drug is given in a hospital or clinic and will not be stored  at home. NOTE: This sheet is a summary. It may not cover all possible information. If you have questions about this medicine, talk to your doctor, pharmacist, orhealth care provider.  2022 Elsevier/Gold Standard (2018-12-02 15:59:17)

## 2021-08-06 ENCOUNTER — Ambulatory Visit: Payer: Self-pay

## 2021-08-06 ENCOUNTER — Telehealth: Payer: Self-pay

## 2021-08-06 ENCOUNTER — Other Ambulatory Visit: Payer: Self-pay | Admitting: Infectious Disease

## 2021-08-06 ENCOUNTER — Other Ambulatory Visit (HOSPITAL_COMMUNITY): Payer: Self-pay

## 2021-08-06 DIAGNOSIS — C01 Malignant neoplasm of base of tongue: Secondary | ICD-10-CM | POA: Diagnosis not present

## 2021-08-06 DIAGNOSIS — E781 Pure hyperglyceridemia: Secondary | ICD-10-CM

## 2021-08-06 DIAGNOSIS — Z510 Encounter for antineoplastic radiation therapy: Secondary | ICD-10-CM | POA: Diagnosis not present

## 2021-08-06 DIAGNOSIS — I10 Essential (primary) hypertension: Secondary | ICD-10-CM

## 2021-08-06 NOTE — Telephone Encounter (Signed)
I spoke with pt. He states he is doing good so far. He admits to a little nausea, but resolved with 1 dose of compazine. No emesis. Afebrile. No skin reactions/rashes, & no diarrhea. I reminded pt that after Friday, he cant alternate the nausea medications if needed. I also reminded pt of the importance of calling us with temp of 100.4 or higher DAY OR NIGHT. Pt verbalized understanding. Pt mentioned that wearing the mask for radiation is tough. I told him to make the radiation staff aware if it gets to the point where he may need something for anxiety. I told him if he does require taking something for anxiety, he will need a driver.

## 2021-08-07 ENCOUNTER — Other Ambulatory Visit (HOSPITAL_COMMUNITY): Payer: Self-pay

## 2021-08-07 DIAGNOSIS — C01 Malignant neoplasm of base of tongue: Secondary | ICD-10-CM | POA: Diagnosis not present

## 2021-08-07 DIAGNOSIS — Z510 Encounter for antineoplastic radiation therapy: Secondary | ICD-10-CM | POA: Diagnosis not present

## 2021-08-07 MED ORDER — METOPROLOL SUCCINATE ER 100 MG PO TB24
ORAL_TABLET | ORAL | 4 refills | Status: DC
Start: 2021-08-07 — End: 2022-05-06
  Filled 2021-08-07: qty 30, 30d supply, fill #0

## 2021-08-07 MED ORDER — ROSUVASTATIN CALCIUM 20 MG PO TABS
ORAL_TABLET | Freq: Every day | ORAL | 4 refills | Status: DC
Start: 2021-08-07 — End: 2021-09-02
  Filled 2021-08-07: qty 30, 30d supply, fill #0

## 2021-08-07 MED ORDER — AMITRIPTYLINE HCL 25 MG PO TABS
ORAL_TABLET | Freq: Every day | ORAL | 4 refills | Status: DC
  Filled 2021-08-07: qty 30, 30d supply, fill #0

## 2021-08-07 NOTE — Telephone Encounter (Signed)
Sent message to front desk asking they reach out to patient for scheduling.   Beryle Flock, RN

## 2021-08-08 ENCOUNTER — Encounter: Payer: Self-pay | Admitting: Oncology

## 2021-08-08 ENCOUNTER — Telehealth: Payer: Self-pay

## 2021-08-08 DIAGNOSIS — C01 Malignant neoplasm of base of tongue: Secondary | ICD-10-CM | POA: Diagnosis not present

## 2021-08-08 DIAGNOSIS — Z510 Encounter for antineoplastic radiation therapy: Secondary | ICD-10-CM | POA: Diagnosis not present

## 2021-08-08 NOTE — Telephone Encounter (Signed)
Patient states he had fever of 100.1F last night but no other. No other symptoms noted. Vital Signs stable. Dayton Scrape, NP made aware. Patient informed if another fever to call and notify us or if he has any signs of infection. VS: 98.4, PR 95, RR 16 and BP 138/74.

## 2021-08-11 ENCOUNTER — Other Ambulatory Visit (HOSPITAL_COMMUNITY): Payer: Self-pay

## 2021-08-11 DIAGNOSIS — C01 Malignant neoplasm of base of tongue: Secondary | ICD-10-CM | POA: Diagnosis not present

## 2021-08-11 DIAGNOSIS — Z510 Encounter for antineoplastic radiation therapy: Secondary | ICD-10-CM | POA: Diagnosis not present

## 2021-08-12 ENCOUNTER — Encounter: Payer: Self-pay | Admitting: Oncology

## 2021-08-12 DIAGNOSIS — Z510 Encounter for antineoplastic radiation therapy: Secondary | ICD-10-CM | POA: Diagnosis not present

## 2021-08-12 DIAGNOSIS — C01 Malignant neoplasm of base of tongue: Secondary | ICD-10-CM | POA: Diagnosis not present

## 2021-08-13 DIAGNOSIS — Z510 Encounter for antineoplastic radiation therapy: Secondary | ICD-10-CM | POA: Diagnosis not present

## 2021-08-13 DIAGNOSIS — C01 Malignant neoplasm of base of tongue: Secondary | ICD-10-CM | POA: Diagnosis not present

## 2021-08-14 ENCOUNTER — Other Ambulatory Visit: Payer: Self-pay

## 2021-08-14 ENCOUNTER — Other Ambulatory Visit (HOSPITAL_COMMUNITY): Payer: Self-pay

## 2021-08-14 ENCOUNTER — Ambulatory Visit (INDEPENDENT_AMBULATORY_CARE_PROVIDER_SITE_OTHER): Payer: BC Managed Care – PPO | Admitting: Infectious Disease

## 2021-08-14 ENCOUNTER — Other Ambulatory Visit (HOSPITAL_COMMUNITY)
Admission: RE | Admit: 2021-08-14 | Discharge: 2021-08-14 | Disposition: A | Discharge status: Home / Self Care | Payer: BC Managed Care – PPO | Source: Ambulatory Visit | Attending: Infectious Disease | Admitting: Infectious Disease

## 2021-08-14 ENCOUNTER — Encounter: Payer: Self-pay | Admitting: Infectious Disease

## 2021-08-14 VITALS — BP 113/75 | HR 78 | Temp 98.8°F | Wt 218.0 lb

## 2021-08-14 DIAGNOSIS — B20 Human immunodeficiency virus [HIV] disease: Secondary | ICD-10-CM | POA: Diagnosis not present

## 2021-08-14 DIAGNOSIS — E781 Pure hyperglyceridemia: Secondary | ICD-10-CM

## 2021-08-14 DIAGNOSIS — I10 Essential (primary) hypertension: Secondary | ICD-10-CM

## 2021-08-14 DIAGNOSIS — Z510 Encounter for antineoplastic radiation therapy: Secondary | ICD-10-CM | POA: Diagnosis not present

## 2021-08-14 DIAGNOSIS — E1169 Type 2 diabetes mellitus with other specified complication: Secondary | ICD-10-CM

## 2021-08-14 DIAGNOSIS — C01 Malignant neoplasm of base of tongue: Secondary | ICD-10-CM | POA: Diagnosis not present

## 2021-08-14 DIAGNOSIS — I517 Cardiomegaly: Secondary | ICD-10-CM

## 2021-08-14 DIAGNOSIS — E079 Disorder of thyroid, unspecified: Secondary | ICD-10-CM

## 2021-08-14 DIAGNOSIS — Z7185 Encounter for immunization safety counseling: Secondary | ICD-10-CM

## 2021-08-14 DIAGNOSIS — Z79899 Other long term (current) drug therapy: Secondary | ICD-10-CM | POA: Diagnosis not present

## 2021-08-14 HISTORY — DX: Disorder of thyroid, unspecified: E07.9

## 2021-08-14 MED ORDER — SULFAMETHOXAZOLE-TRIMETHOPRIM 200-40 MG/5ML PO SUSP
20.0000 mL | Freq: Every day | ORAL | 6 refills | Status: DC
  Filled 2021-08-14: qty 100, 5d supply, fill #0
  Filled 2021-09-24: qty 100, 5d supply, fill #1
  Filled 2021-10-10: qty 100, 5d supply, fill #2
  Filled 2021-10-16: qty 100, 5d supply, fill #3
  Filled 2021-10-27: qty 100, 5d supply, fill #4
  Filled 2021-10-31: qty 100, 5d supply, fill #5
  Filled 2021-11-06: qty 100, 5d supply, fill #6

## 2021-08-14 NOTE — Progress Notes (Signed)
Reviewed appropriate enteral administration of etravirine and Descovy with patient and family. Once patient is utilizing tube feeds regularly for nutrition, patient should hold tube feeds 30 minutes prior to administering his ART. He should crush each  medication individually and disperse each in 30 mL of water. After his tube feeds are held, he should flush his PEG tube with at least 30 mL of water then administer each drug individually. Once administered, flush with another 30 mL and hold tube feeds for an additional 30 minutes. After this 30-minute interval, his tube feeds may be resumed. Will continue monitoring his HIV RNA to assess appropriate absorption.   Alfonse Spruce, PharmD, CPP Clinical Pharmacist Practitioner [facility]

## 2021-08-14 NOTE — Progress Notes (Signed)
Subjective:  Complaint dysphagia related to his tongue cancer  Patient ID: Ricardo [name], male    DOB: 10-28-1961, 60 y.o.   MRN: [id number]  HPI  Ricardo [name] is a 60 year old Caucasian man living with HIV that is typically been well controlled more recently on a regimen of 2 tablets of intolerance once daily with DESCOVY.  Roughly 4 months ago he began having dysphagia.  This progressed he developed left neck pain and swelling.  He was in [place] where he had CT scans that showed a tongue mass and level 2 lymphadenopathy.  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  He also was found to have a hypermetabolic area in the thyroid gland on PET scan.  Attempts were made to do a fine-needle aspirate but ultrasound did not reveal a target for fine-needle aspirate.  Has now started cisplatin of the direction of oncology and is also seen radiation oncology in our system.  He is concerned about his CD4 cell likely having dropped.  He received 1 COVID-19 vaccine but not any further due to his distrust "of the Democrats, and Fauci, beating drums of fear."  I would like him to get Evushield and he is agreeable to this.  Has he is lost roughly 25 pounds but still on several medications for diabetes has not seen primary care yet.  Is also on metoprolol and lisinopril for hypertension.  He has a PEG tube in place as well as a port he says that he has been told that soon his dysphagia will get so bad he will no longer be able to eat food and will have to use the PEG tube for tube feeds.  He is currently still taking intolerance which is dissolving and liquid and taking once a day along with DESCOVY which he currently cannot swallow.       Past Medical History:  Diagnosis Date   Abnormal liver function tests 11/16/2016   Diabetes mellitus type 2 in obese ([place]) 11/20/2015   Diabetes  mellitus without complication ([facility])    Diabetes type 2, uncontrolled ([place])    HIV (human immunodeficiency virus infection) ([place])    Hyperlipidemia    LVH (left ventricular hypertrophy)    Obesity 05/08/2019   Palpitations    Rash and nonspecific skin eruption 09/15/2016    No past surgical history on file.  Family History  Problem Relation Age of Onset   Coronary artery disease Father 7      Social History   Socioeconomic History   Marital status: Married    Spouse name: Not on file   Number of children: Not on file   Years of education: Not on file   Highest education level: Not on file  Occupational History   Not on file  Tobacco Use   Smoking status: Never   Smokeless tobacco: Never  Substance and Sexual Activity   Alcohol use: No   Drug use: No   Sexual activity: Yes    Partners: Female    Comment: pt. declined condoms  Other Topics Concern   Not on file  Social History Narrative   Not on file   Social Determinants of Health   Financial Resource Strain: Not on file  Food Insecurity: Not on file  Transportation Needs: Not on file  Physical Activity: Not on file  Stress: Not on file  Social Connections: Not on file  Allergies  Allergen Reactions   Amoxicillin-Pot Clavulanate      Current Outpatient Medications:    amitriptyline (ELAVIL) 25 MG tablet, TAKE 1 TABLET BY MOUTH ONCE DAILY AT BEDTIME, Disp: 30 tablet, Rfl: 4   Blood Glucose Monitoring Suppl (ONE TOUCH ULTRA 2) W/DEVICE KIT, Use to check blood sugars 1/day., Disp: 1 each, Rfl: 0   diclofenac (VOLTAREN) 75 MG EC tablet, Take 75 mg by mouth 2 (two) times daily., Disp: , Rfl:    Dulaglutide (TRULICITY) 4.5 DJ/5.7SV SOPN, Inject 1 pen under the skin once weekly as directed, Disp: 2 mL, Rfl: 3   emtricitabine-tenofovir AF (DESCOVY) 200-25 MG tablet, TAKE 1 TABLET BY MOUTH DAILY., Disp: 30 tablet, Rfl: 4   Etravirine 200 MG TABS, TAKE 2 TABLETS (400 MG TOTAL) BY MOUTH DAILY., Disp: 60 tablet,  Rfl: 4   FARXIGA 10 MG TABS tablet, Take 10 mg by mouth daily., Disp: , Rfl:    fenofibrate 160 MG tablet, TAKE 1 TABLET BY MOUTH ONCE A DAY, Disp: 90 tablet, Rfl: 1   fenofibrate 160 MG tablet, Take 1 tablet by mouth daily, Disp: 90 tablet, Rfl: 1   glipiZIDE (GLUCOTROL XL) 5 MG 24 hr tablet, Take 2 tablets by mouth daily, Disp: 180 tablet, Rfl: 0   glucose blood (ONE TOUCH ULTRA TEST) test strip, Use to check blood sugars three times daily., Disp: 100 each, Rfl: 11   lisinopril (ZESTRIL) 2.5 MG tablet, Take 1 tablet by mouth daily for kidney protection (DM), Disp: 90 tablet, Rfl: 1   lisinopril (ZESTRIL) 2.5 MG tablet, Take 1 (one) Tablet daily, for kidney protection (DM), Disp: 90 tablet, Rfl: 1   metoprolol succinate (TOPROL-XL) 100 MG 24 hr tablet, TAKE 1 TABLET BY MOUTH ONCE A DAY WITH OR IMMEDIATELY FOLLOWING A MEAL (NEEDS APPT), Disp: 30 tablet, Rfl: 4   ondansetron (ZOFRAN) 4 MG tablet, Take 1 tablet (4 mg total) by mouth every 4 (four) hours as needed for nausea., Disp: 90 tablet, Rfl: 3   oxyCODONE (OXY IR/ROXICODONE) 5 MG immediate release tablet, Take 1 tablet (5 mg total) by mouth every 4 (four) hours as needed for severe pain., Disp: 30 tablet, Rfl: 0   prochlorperazine (COMPAZINE) 10 MG tablet, Take 1 tablet (10 mg total) by mouth every 6 (six) hours as needed for nausea or vomiting., Disp: 90 tablet, Rfl: 3   rosuvastatin (CRESTOR) 20 MG tablet, TAKE 1 TABLET BY MOUTH ONCE A DAY, Disp: 30 tablet, Rfl: 4   Review of Systems  Constitutional:  Positive for fatigue and unexpected weight change. Negative for activity change, appetite change, chills, diaphoresis and fever.  HENT:  Positive for sore throat and trouble swallowing. Negative for congestion, rhinorrhea, sinus pressure and sneezing.   Eyes:  Negative for photophobia and visual disturbance.  Respiratory:  Negative for cough, chest tightness, shortness of breath, wheezing and stridor.   Cardiovascular:  Negative for chest  pain, palpitations and leg swelling.  Gastrointestinal:  Negative for abdominal distention, abdominal pain, anal bleeding, blood in stool, constipation, diarrhea, nausea and vomiting.  Genitourinary:  Negative for difficulty urinating, dysuria, flank pain and hematuria.  Musculoskeletal:  Negative for arthralgias, back pain, gait problem, joint swelling and myalgias.  Skin:  Negative for color change, pallor, rash and wound.  Neurological:  Negative for dizziness, tremors, weakness and light-headedness.  Hematological:  Negative for adenopathy. Does not bruise/bleed easily.  Psychiatric/Behavioral:  Negative for agitation, behavioral problems, confusion, decreased concentration, dysphoric mood and sleep disturbance.  Objective:   Physical Exam Constitutional:      Appearance: He is well-developed.  HENT:     Head: Normocephalic and atraumatic.  Eyes:     Conjunctiva/sclera: Conjunctivae normal.  Cardiovascular:     Rate and Rhythm: Normal rate and regular rhythm.  Pulmonary:     Effort: Pulmonary effort is normal. No respiratory distress.     Breath sounds: No wheezing.  Abdominal:     General: There is no distension.     Palpations: Abdomen is soft.  Musculoskeletal:        General: No tenderness. Normal range of motion.     Cervical back: Normal range of motion and neck supple.  Skin:    General: Skin is warm and dry.     Coloration: Skin is not pale.     Findings: No erythema or rash.  Neurological:     General: No focal deficit present.     Mental Status: He is alert and oriented to person, place, and time.  Psychiatric:        Mood and Affect: Mood normal.        Behavior: Behavior normal.        Thought Content: Thought content normal.        Judgment: Judgment normal.   Port-A-Cath in place PEG tube in place.       Assessment & Plan:  HIV disease:  Discussed case with Alfonse Spruce from [facility] and it appears will be able to give his current regimen  per Feeding tube though when he takes Tube Feeds he will need  To have him hold tube feeds for 30 minutes prior to missing his antiretroviral therapies.  He should then flush his PEG tube with 30 mL of water should crush all the medicines individually dispensed each with 30 mL of water.  After this is done he should hold tube feeds for an additional 30 minutes and then resume his tube feeds.  I will therefore continue intolerance and DESCOVY for now.  In the past I tried to use TIVICAY and DESCOVY and he thought the TIVICAY was responsible for his diabetes worsening.  It did happen during that time.  Where he had actually stopped taking his metformin which probably was a real reason for his diabetes worsening though he could have had weight gain with TIVICAY certainly as well.  We will check an HIV viral load today as well as CD4 CBC with differential and CMP with GFR.  I am also going to start liquid Bactrim the equivalent of a double strength tablet daily to prevent PCP as I suspect his CD4 count is going to be low if it is not already low.  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  Diabetes mellitus: I have concerns with his 20 pound weight got lost that he may be on too many medications for his diabetes  I am checking an A1c.  He needs to get plugged in with his primary care physician I will send her a note.  Left ventricle hypertrophy he is on metoprolol.  Hypertension on low-dose lisinopril again I have concerns with his large amount of weight loss whether his beta-blocker and ACE inhibitor will be well-tolerated if he continues  to lose weight.  I have counseled him to get a blood pressure cuff to monitor blood pressure and to monitor for symptoms with  dizziness or lightheadedness that might occur if he receiving too much in the way of antihypertensive medications in the context of weight loss from his chemo and radiation.   COVID prevention: would like him to get Evushield  I spent 42  minutes with the patient including face to face counseling of the patient regarding his HIV disease and different therapies that of been used, how to fit this current therapy with his current multiple medical problems including his squamous cell carcinoma of the tongue with chemotherapy radiation his diabetes mellitus hypertension left ventricular hypertrophy in [facility] [place] as well as his oncologist and [facility] and radiation oncologist review of medical records before and during the visit and in coordination of his care with [facility].

## 2021-08-14 NOTE — Progress Notes (Signed)
[facility]  [address] [place],  [place]  [address] [phone]  Clinic Day:  08/22/2021  Referring physician: Rush Farmer, MD  This document serves as a record of services personally performed by Dequincy Macarthur Critchley, MD. It was created on their behalf by [facility], a trained medical scribe. The creation of this record is based on the scribe's personal observations and the provider's statements to them.  HISTORY OF PRESENT ILLNESS:  The patient is a 60 y.o. 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  He is receiving chemoradiation with the chemotherapy portion consisting of 3 cycles of cisplatin.  He comes in today prior to a 2nd cycle of chemotherapy.  Since his last visit, the patient has been doing okay.  Odynophagia and left neck pain remain issues, but they have been manageable.  He does believe his cervical lymphadenopathy is decreasing.    PHYSICAL EXAM:  Blood pressure 134/75, pulse 91, temperature 98.3 F (36.8 C), temperature source Oral, resp. rate 18, height '5\' 10"'$  (1.778 m), weight 224 lb 3.2 oz (101.7 kg), SpO2 97 %. Wt Readings from Last 3 Encounters:  07/04/21 224 lb 3.2 oz (101.7 kg)  05/08/19 250 lb (113.4 kg)  08/24/18 232 lb (105.2 kg)   Body mass index is 32.17 kg/m. Performance status (ECOG): 1 - Symptomatic but completely ambulatory  Physical Exam Constitutional:      Appearance: Normal appearance. He is not ill-appearing.  HENT:     Mouth/Throat:     Mouth: Mucous membranes are moist.     Pharynx: Oropharynx is clear. No oropharyngeal exudate or posterior oropharyngeal erythema.  Cardiovascular:     Rate and Rhythm: Normal rate and regular rhythm.     Heart sounds: No murmur heard.   No friction rub. No gallop.  Pulmonary:     Effort: Pulmonary effort is normal. No respiratory distress.     Breath sounds: Normal breath sounds. No wheezing, rhonchi or rales.  Chest:  Breasts:     Right: No axillary adenopathy or supraclavicular adenopathy.     Left: No axillary adenopathy or supraclavicular adenopathy.  Abdominal:     General: Bowel sounds are normal. There is no distension.     Palpations: Abdomen is soft. There is no mass.     Tenderness: no abdominal tenderness  Musculoskeletal:        General: No swelling.     Right lower leg: No edema.     Left lower leg: No edema.  Lymphadenopathy:     Cervical: Cervical adenopathy (level 2 node palpated, but smaller at 2 cm) present.     Right cervical: No superficial cervical adenopathy.    Left cervical: Superficial cervical adenopathy (level 2 lymph node palpated, but smaller at 2 cm) present.     Upper Body:     Right upper body: No supraclavicular or axillary adenopathy.     Left upper body: No supraclavicular or axillary adenopathy.     Lower Body: No right inguinal adenopathy. No left inguinal adenopathy.  Skin:    General: Skin is warm.     Coloration: Skin is not jaundiced.     Findings: No lesion or rash.  Neurological:     General: No focal deficit present.     Mental Status: He is alert and oriented to person, place, and time. Mental status is at baseline.     Cranial Nerves: Cranial nerves are intact.  Psychiatric:  Mood and Affect: Mood normal.        Behavior: Behavior normal.        Thought Content: Thought content normal.   LABS:  Ref. Range 08/22/2021 00:00  Sodium Latest Ref Range: 137 - 147  135 (A)  Potassium Latest Ref Range: 3.4 - 5.3  3.9  Chloride Latest Ref Range: 99 - 108  102  CO2 Latest Ref Range: 13 - 22  20  Glucose Unknown 225  BUN Latest Ref Range: 4 - 21  14  Creatinine Latest Ref Range: 0.6 - 1.3  0.8  Calcium Latest Ref Range: 8.7 - 10.7  9.3  Magnesium Unknown 1.9  Alkaline Phosphatase Latest Ref Range: 25 - 125  68  Albumin Latest Ref Range: 3.5 - 5.0  4.3  AST Latest Ref Range: 14 - 40  46 (A)  ALT Latest Ref Range: 10 - 40  29  Bilirubin, Total Unknown 0.4  WBC  Unknown 3.8  RBC Latest Ref Range: 3.87 - 5.11  4.18  Hemoglobin Latest Ref Range: 13.5 - 17.5  13.1 (A)  HCT Latest Ref Range: 41 - 53  38 (A)  Platelets Latest Ref Range: 150 - 399  231  NEUT# Unknown 2.28    ASSESSMENT & PLAN:  A 60 y.o. 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  He will proceed with his 2nd cycle of cisplatin next week, as part of his definitive chemoradiation.  Clinically, he appears to be doing well.  Of note, there was a questionable area of hypermetabolic activity with his thyroid isthmus.  However, a thyroid ultrasound showed a heterogenous thyroid gland with no discrete nodule.  Based upon this, a thyroid biopsy does not appear to be necessary.  Otherwise, I will see him back in 3 weeks before he heads into his 3rd and final cycle of cisplatin.  The patient understands all the plans discussed today and is in agreement with them.  I, Rita Ohara, am acting as scribe for Marice Potter, MD    I have reviewed this report as typed by the medical scribe, and it is complete and accurate.  Dequincy Macarthur Critchley, MD

## 2021-08-15 DIAGNOSIS — C01 Malignant neoplasm of base of tongue: Secondary | ICD-10-CM | POA: Diagnosis not present

## 2021-08-15 DIAGNOSIS — Z510 Encounter for antineoplastic radiation therapy: Secondary | ICD-10-CM | POA: Diagnosis not present

## 2021-08-15 LAB — URINE CYTOLOGY ANCILLARY ONLY
Chlamydia: NEGATIVE
Comment: NEGATIVE
Comment: NORMAL
Neisseria Gonorrhea: NEGATIVE

## 2021-08-17 ENCOUNTER — Encounter: Payer: Self-pay | Admitting: Oncology

## 2021-08-17 LAB — COMPLETE METABOLIC PANEL WITH GFR
AG Ratio: 1.3 (calc) (ref 1.0–2.5)
ALT: 29 U/L (ref 9–46)
AST: 35 U/L (ref 10–35)
Albumin: 4.5 g/dL (ref 3.6–5.1)
Alkaline phosphatase (APISO): 60 U/L (ref 35–144)
BUN: 23 mg/dL (ref 7–25)
CO2: 25 mmol/L (ref 20–32)
Calcium: 9.5 mg/dL (ref 8.6–10.3)
Chloride: 100 mmol/L (ref 98–110)
Creat: 1 mg/dL (ref 0.70–1.35)
Globulin: 3.4 g/dL (calc) (ref 1.9–3.7)
Glucose, Bld: 102 mg/dL — ABNORMAL HIGH (ref 65–99)
Potassium: 4.1 mmol/L (ref 3.5–5.3)
Sodium: 135 mmol/L (ref 135–146)
Total Bilirubin: 0.4 mg/dL (ref 0.2–1.2)
Total Protein: 7.9 g/dL (ref 6.1–8.1)
eGFR: 86 mL/min/{1.73_m2} (ref >=60)

## 2021-08-17 LAB — LIPID PANEL
Cholesterol: 140 mg/dL (ref <200)
HDL: 31 mg/dL — ABNORMAL LOW (ref >=40)
Non-HDL Cholesterol (Calc): 109 mg/dL (calc) (ref <130)
Total CHOL/HDL Ratio: 4.5 (calc) (ref <5.0)
Triglycerides: 474 mg/dL — ABNORMAL HIGH (ref <150)

## 2021-08-17 LAB — CBC WITH DIFFERENTIAL/PLATELET
Absolute Monocytes: 447 cells/uL (ref 200–950)
Basophils Absolute: 61 cells/uL (ref 0–200)
Basophils Relative: 1.3 %
Eosinophils Absolute: 61 cells/uL (ref 15–500)
Eosinophils Relative: 1.3 %
HCT: 41.2 % (ref 38.5–50.0)
Hemoglobin: 13.7 g/dL (ref 13.2–17.1)
Lymphs Abs: 1622 cells/uL (ref 850–3900)
MCH: 30.4 pg (ref 27.0–33.0)
MCHC: 33.3 g/dL (ref 32.0–36.0)
MCV: 91.6 fL (ref 80.0–100.0)
MPV: 9.2 fL (ref 7.5–12.5)
Monocytes Relative: 9.5 %
Neutro Abs: 2510 cells/uL (ref 1500–7800)
Neutrophils Relative %: 53.4 %
Platelets: 244 10*3/uL (ref 140–400)
RBC: 4.5 10*6/uL (ref 4.20–5.80)
RDW: 13.1 % (ref 11.0–15.0)
Total Lymphocyte: 34.5 %
WBC: 4.7 10*3/uL (ref 3.8–10.8)

## 2021-08-17 LAB — HIV-1 RNA QUANT-NO REFLEX-BLD
HIV 1 RNA Quant: NOT DETECTED Copies/mL
HIV-1 RNA Quant, Log: NOT DETECTED Log cps/mL

## 2021-08-17 LAB — HEMOGLOBIN A1C
Hgb A1c MFr Bld: 8.9 % of total Hgb — ABNORMAL HIGH (ref <5.7)
Mean Plasma Glucose: 209 mg/dL
eAG (mmol/L): 11.6 mmol/L

## 2021-08-17 LAB — RPR: RPR Ser Ql: NONREACTIVE

## 2021-08-18 DIAGNOSIS — Z510 Encounter for antineoplastic radiation therapy: Secondary | ICD-10-CM | POA: Diagnosis not present

## 2021-08-18 DIAGNOSIS — C01 Malignant neoplasm of base of tongue: Secondary | ICD-10-CM | POA: Diagnosis not present

## 2021-08-18 LAB — T-HELPER CELL (CD4) - (RCID CLINIC ONLY)
CD4 % Helper T Cell: 31 % — ABNORMAL LOW (ref 33–65)
CD4 T Cell Abs: 525 /uL (ref 400–1790)

## 2021-08-19 ENCOUNTER — Other Ambulatory Visit (HOSPITAL_COMMUNITY): Payer: Self-pay

## 2021-08-19 ENCOUNTER — Other Ambulatory Visit: Payer: Self-pay | Admitting: Infectious Disease

## 2021-08-19 DIAGNOSIS — Z510 Encounter for antineoplastic radiation therapy: Secondary | ICD-10-CM | POA: Diagnosis not present

## 2021-08-19 DIAGNOSIS — C01 Malignant neoplasm of base of tongue: Secondary | ICD-10-CM | POA: Diagnosis not present

## 2021-08-19 MED ORDER — NYSTATIN 100000 UNIT/ML MT SUSP
OROMUCOSAL | 2 refills | Status: DC
  Filled 2021-08-19: qty 240, 12d supply, fill #0
  Filled 2021-09-12: qty 240, 12d supply, fill #1
  Filled 2021-09-21: qty 240, 12d supply, fill #2

## 2021-08-19 NOTE — Progress Notes (Signed)
Trying to order evushield

## 2021-08-20 ENCOUNTER — Other Ambulatory Visit: Payer: Self-pay | Admitting: Infectious Disease

## 2021-08-20 DIAGNOSIS — C01 Malignant neoplasm of base of tongue: Secondary | ICD-10-CM | POA: Diagnosis not present

## 2021-08-20 DIAGNOSIS — Z510 Encounter for antineoplastic radiation therapy: Secondary | ICD-10-CM | POA: Diagnosis not present

## 2021-08-21 DIAGNOSIS — C61 Malignant neoplasm of prostate: Secondary | ICD-10-CM | POA: Diagnosis not present

## 2021-08-21 DIAGNOSIS — Z510 Encounter for antineoplastic radiation therapy: Secondary | ICD-10-CM | POA: Diagnosis not present

## 2021-08-21 DIAGNOSIS — C01 Malignant neoplasm of base of tongue: Secondary | ICD-10-CM | POA: Diagnosis not present

## 2021-08-22 ENCOUNTER — Other Ambulatory Visit: Payer: Self-pay | Admitting: Oncology

## 2021-08-22 ENCOUNTER — Other Ambulatory Visit: Payer: Self-pay | Admitting: Hematology and Oncology

## 2021-08-22 ENCOUNTER — Other Ambulatory Visit: Payer: Self-pay | Admitting: Infectious Disease

## 2021-08-22 ENCOUNTER — Inpatient Hospital Stay: Payer: BC Managed Care – PPO | Attending: Oncology

## 2021-08-22 ENCOUNTER — Inpatient Hospital Stay (INDEPENDENT_AMBULATORY_CARE_PROVIDER_SITE_OTHER): Payer: BC Managed Care – PPO | Admitting: Oncology

## 2021-08-22 ENCOUNTER — Encounter: Payer: Self-pay | Admitting: Oncology

## 2021-08-22 VITALS — BP 129/79 | HR 101 | Temp 98.9°F | Resp 18 | Wt 212.8 lb

## 2021-08-22 DIAGNOSIS — C01 Malignant neoplasm of base of tongue: Secondary | ICD-10-CM | POA: Diagnosis not present

## 2021-08-22 DIAGNOSIS — Z510 Encounter for antineoplastic radiation therapy: Secondary | ICD-10-CM | POA: Diagnosis not present

## 2021-08-22 DIAGNOSIS — C61 Malignant neoplasm of prostate: Secondary | ICD-10-CM | POA: Diagnosis not present

## 2021-08-22 DIAGNOSIS — R110 Nausea: Secondary | ICD-10-CM | POA: Insufficient documentation

## 2021-08-22 DIAGNOSIS — E119 Type 2 diabetes mellitus without complications: Secondary | ICD-10-CM | POA: Insufficient documentation

## 2021-08-22 DIAGNOSIS — Z79899 Other long term (current) drug therapy: Secondary | ICD-10-CM | POA: Insufficient documentation

## 2021-08-22 DIAGNOSIS — Z5111 Encounter for antineoplastic chemotherapy: Secondary | ICD-10-CM | POA: Insufficient documentation

## 2021-08-22 LAB — CBC AND DIFFERENTIAL
HCT: 38 — AB (ref 41–53)
Hemoglobin: 13.1 — AB (ref 13.5–17.5)
Neutrophils Absolute: 2.28
Platelets: 231 (ref 150–399)
WBC: 3.8

## 2021-08-22 LAB — BASIC METABOLIC PANEL
BUN: 14 (ref 4–21)
CO2: 20 (ref 13–22)
Chloride: 102 (ref 99–108)
Creatinine: 0.8 (ref 0.6–1.3)
Glucose: 225
Potassium: 3.9 (ref 3.4–5.3)
Sodium: 135 — AB (ref 137–147)

## 2021-08-22 LAB — HEPATIC FUNCTION PANEL
ALT: 29 (ref 10–40)
AST: 46 — AB (ref 14–40)
Alkaline Phosphatase: 68 (ref 25–125)
Bilirubin, Total: 0.4

## 2021-08-22 LAB — COMPREHENSIVE METABOLIC PANEL
Albumin: 4.3 (ref 3.5–5.0)
Calcium: 9.3 (ref 8.7–10.7)

## 2021-08-22 LAB — CBC: RBC: 4.18 (ref 3.87–5.11)

## 2021-08-22 LAB — MAGNESIUM: Magnesium: 1.9

## 2021-08-22 MED FILL — Fosaprepitant Dimeglumine For IV Infusion 150 MG (Base Eq): INTRAVENOUS | Qty: 5 | Status: AC

## 2021-08-22 MED FILL — Cisplatin Inj 100 MG/100ML (1 MG/ML): INTRAVENOUS | Qty: 221 | Status: AC

## 2021-08-22 MED FILL — Dexamethasone Sodium Phosphate Inj 100 MG/10ML: INTRAMUSCULAR | Qty: 1 | Status: AC

## 2021-08-26 ENCOUNTER — Encounter: Payer: Self-pay | Admitting: Hematology and Oncology

## 2021-08-26 ENCOUNTER — Other Ambulatory Visit (HOSPITAL_COMMUNITY): Payer: Self-pay

## 2021-08-26 ENCOUNTER — Telehealth: Payer: Self-pay

## 2021-08-26 ENCOUNTER — Telehealth: Payer: Self-pay | Admitting: Oncology

## 2021-08-26 ENCOUNTER — Encounter: Payer: Self-pay | Admitting: Oncology

## 2021-08-26 ENCOUNTER — Other Ambulatory Visit: Payer: Self-pay

## 2021-08-26 ENCOUNTER — Inpatient Hospital Stay (INDEPENDENT_AMBULATORY_CARE_PROVIDER_SITE_OTHER): Payer: BC Managed Care – PPO

## 2021-08-26 VITALS — BP 135/78 | HR 80 | Temp 97.7°F | Resp 18 | Ht 70.0 in | Wt 210.0 lb

## 2021-08-26 DIAGNOSIS — Z79899 Other long term (current) drug therapy: Secondary | ICD-10-CM | POA: Diagnosis not present

## 2021-08-26 DIAGNOSIS — E119 Type 2 diabetes mellitus without complications: Secondary | ICD-10-CM | POA: Diagnosis not present

## 2021-08-26 DIAGNOSIS — C01 Malignant neoplasm of base of tongue: Secondary | ICD-10-CM

## 2021-08-26 DIAGNOSIS — R110 Nausea: Secondary | ICD-10-CM | POA: Diagnosis not present

## 2021-08-26 DIAGNOSIS — Z5111 Encounter for antineoplastic chemotherapy: Secondary | ICD-10-CM | POA: Diagnosis not present

## 2021-08-26 MED ORDER — SODIUM CHLORIDE 0.9 % IV SOLN
10.0000 mg | Freq: Once | INTRAVENOUS | Status: AC
  Administered 2021-08-26: 10 mg via INTRAVENOUS
  Filled 2021-08-26: qty 10

## 2021-08-26 MED ORDER — POTASSIUM CHLORIDE IN NACL 20-0.9 MEQ/L-% IV SOLN
Freq: Once | INTRAVENOUS | Status: AC
  Filled 2021-08-26: qty 1000

## 2021-08-26 MED ORDER — CISPLATIN CHEMO INJECTION 100MG/100ML
100.0000 mg/m2 | Freq: Once | INTRAVENOUS | Status: AC
  Administered 2021-08-26: 221 mg via INTRAVENOUS
  Filled 2021-08-26: qty 200

## 2021-08-26 MED ORDER — OXYCODONE-ACETAMINOPHEN 5-325 MG/5ML PO SOLN
5.0000 mL | ORAL | 0 refills | Status: DC | PRN
  Filled 2021-08-26: qty 140, 7d supply, fill #0

## 2021-08-26 MED ORDER — PALONOSETRON HCL INJECTION 0.25 MG/5ML
0.2500 mg | Freq: Once | INTRAVENOUS | Status: AC
  Administered 2021-08-26: 0.25 mg via INTRAVENOUS
  Filled 2021-08-26: qty 5

## 2021-08-26 MED ORDER — SODIUM CHLORIDE 0.9 % IV SOLN
150.0000 mg | Freq: Once | INTRAVENOUS | Status: AC
  Administered 2021-08-26: 150 mg via INTRAVENOUS
  Filled 2021-08-26: qty 5

## 2021-08-26 MED ORDER — SODIUM CHLORIDE 0.9% FLUSH
10.0000 mL | INTRAVENOUS | Status: DC | PRN
  Administered 2021-08-26: 10 mL

## 2021-08-26 MED ORDER — HYDROMORPHONE HCL 1 MG/ML IJ SOLN
1.0000 mg | Freq: Once | INTRAMUSCULAR | Status: AC
Start: 2021-08-26 — End: 2021-08-26
  Administered 2021-08-26: 1 mg via INTRAVENOUS

## 2021-08-26 MED ORDER — HEPARIN SOD (PORK) LOCK FLUSH 100 UNIT/ML IV SOLN
500.0000 [IU] | Freq: Once | INTRAVENOUS | Status: AC | PRN
Start: 2021-08-26 — End: 2021-08-26
  Administered 2021-08-26: 500 [IU]

## 2021-08-26 MED ORDER — SODIUM CHLORIDE 0.9 % IV SOLN: Freq: Once | INTRAVENOUS | Status: AC

## 2021-08-26 MED ORDER — MAGNESIUM SULFATE 2 GM/50ML IV SOLN
2.0000 g | Freq: Once | INTRAVENOUS | Status: AC
  Administered 2021-08-26: 2 g via INTRAVENOUS
  Filled 2021-08-26: qty 50

## 2021-08-26 NOTE — Progress Notes (Signed)
The patient initially cancelled infusion and radiation due to worsening sore throat from radiation. We urged him to come in for IV fluids and he wishes to proceed with chemotherapy today as well. He denies fevers, chills, sinus congestion, headache or cough. He denies nausea. He states he is using a higher calorie supplement to better maintain his calorie intake. He is sipping water, but not adding to PEG. He states that he is taking oxycodone '5mg'$  every 4 hours as needed for pain and using magic mouthwash with some relief. VSS, afebrile. Oral mucosa is mildly dry. There is mucositis of the posterior mouth and throat without evidence of candidiasis. I will change his oxycodone to liquid with APAP. Advised him to increase fluids via PEG. Will give dilaudid '1mg'$  IV now.

## 2021-08-26 NOTE — Telephone Encounter (Signed)
Patient's spouse called to let us know patient is nauseated/weak.  Could not get out of bed.  Called spoke w/Tanya concerning patient  Ricardo [name] will call patient

## 2021-08-26 NOTE — Patient Instructions (Signed)
Cisplatin injection What is this medication? CISPLATIN (SIS pla tin) is a chemotherapy drug. It targets fast dividing cells, like cancer cells, and causes these cells to die. This medicine is used to treat many types of cancer like bladder, ovarian, and testicular cancers. This medicine may be used for other purposes; ask your health care provider or pharmacist if you have questions. COMMON BRAND NAME(S): Platinol, Platinol -AQ What should I tell my care team before I take this medication? They need to know if you have any of these conditions: eye disease, vision problems hearing problems kidney disease low blood counts, like white cells, platelets, or red blood cells tingling of the fingers or toes, or other nerve disorder an unusual or allergic reaction to cisplatin, carboplatin, oxaliplatin, other medicines, foods, dyes, or preservatives pregnant or trying to get pregnant breast-feeding How should I use this medication? This drug is given as an infusion into a vein. It is administered in a hospital or clinic by a specially trained health care professional. Talk to your pediatrician regarding the use of this medicine in children. Special care may be needed. Overdosage: If you think you have taken too much of this medicine contact a poison control center or emergency room at once. NOTE: This medicine is only for you. Do not share this medicine with others. What if I miss a dose? It is important not to miss a dose. Call your doctor or health care professional if you are unable to keep an appointment. What may interact with this medication? This medicine may interact with the following medications: foscarnet certain antibiotics like amikacin, gentamicin, neomycin, polymyxin B, streptomycin, tobramycin, vancomycin This list may not describe all possible interactions. Give your health care provider a list of all the medicines, herbs, non-prescription drugs, or dietary supplements you use. Also  tell them if you smoke, drink alcohol, or use illegal drugs. Some items may interact with your medicine. What should I watch for while using this medication? Your condition will be monitored carefully while you are receiving this medicine. You will need important blood work done while you are taking this medicine. This drug may make you feel generally unwell. This is not uncommon, as chemotherapy can affect healthy cells as well as cancer cells. Report any side effects. Continue your course of treatment even though you feel ill unless your doctor tells you to stop. This medicine may increase your risk of getting an infection. Call your healthcare professional for advice if you get a fever, chills, or sore throat, or other symptoms of a cold or flu. Do not treat yourself. Try to avoid being around people who are sick. Avoid taking medicines that contain aspirin, acetaminophen, ibuprofen, naproxen, or ketoprofen unless instructed by your healthcare professional. These medicines may hide a fever. This medicine may increase your risk to bruise or bleed. Call your doctor or health care professional if you notice any unusual bleeding. Be careful brushing and flossing your teeth or using a toothpick because you may get an infection or bleed more easily. If you have any dental work done, tell your dentist you are receiving this medicine. Do not become pregnant while taking this medicine or for 14 months after stopping it. Women should inform their healthcare professional if they wish to become pregnant or think they might be pregnant. Men should not father a child while taking this medicine and for 11 months after stopping it. There is potential for serious side effects to an unborn child. Talk to your healthcare  professional for more information. Do not breast-feed an infant while taking this medicine. This medicine has caused ovarian failure in some women. This medicine may make it more difficult to get  pregnant. Talk to your healthcare professional if you are concerned about your fertility. This medicine has caused decreased sperm counts in some men. This may make it more difficult to father a child. Talk to your healthcare professional if you are concerned about your fertility. Drink fluids as directed while you are taking this medicine. This will help protect your kidneys. Call your doctor or health care professional if you get diarrhea. Do not treat yourself. What side effects may I notice from receiving this medication? Side effects that you should report to your doctor or health care professional as soon as possible: allergic reactions like skin rash, itching or hives, swelling of the face, lips, or tongue blurred vision changes in vision decreased hearing or ringing of the ears nausea, vomiting pain, redness, or irritation at site where injected pain, tingling, numbness in the hands or feet signs and symptoms of bleeding such as bloody or black, tarry stools; red or dark brown urine; spitting up blood or brown material that looks like coffee grounds; red spots on the skin; unusual bruising or bleeding from the eyes, gums, or nose signs and symptoms of infection like fever; chills; cough; sore throat; pain or trouble passing urine signs and symptoms of kidney injury like trouble passing urine or change in the amount of urine signs and symptoms of low red blood cells or anemia such as unusually weak or tired; feeling faint or lightheaded; falls; breathing problems Side effects that usually do not require medical attention (report to your doctor or health care professional if they continue or are bothersome): loss of appetite mouth sores muscle cramps This list may not describe all possible side effects. Call your doctor for medical advice about side effects. You may report side effects to FDA at 1-800-FDA-1088. Where should I keep my medication? This drug is given in a hospital or clinic  and will not be stored at home. NOTE: This sheet is a summary. It may not cover all possible information. If you have questions about this medicine, talk to your doctor, pharmacist, or health care provider.  2022 Elsevier/Gold Standard (2018-12-02 15:59:17)

## 2021-08-26 NOTE — Telephone Encounter (Signed)
Tried calling patient. No answer. Asked patient to call me back for possible IV Fluids. The patient called out today. Rosanne Sack, [facility] aware of the above and agrees that the patient may need IV Fluids. I will try again later.

## 2021-08-27 ENCOUNTER — Other Ambulatory Visit (HOSPITAL_COMMUNITY): Payer: Self-pay

## 2021-08-27 DIAGNOSIS — C61 Malignant neoplasm of prostate: Secondary | ICD-10-CM | POA: Diagnosis not present

## 2021-08-27 DIAGNOSIS — C01 Malignant neoplasm of base of tongue: Secondary | ICD-10-CM | POA: Diagnosis not present

## 2021-08-27 DIAGNOSIS — Z510 Encounter for antineoplastic radiation therapy: Secondary | ICD-10-CM | POA: Diagnosis not present

## 2021-08-28 DIAGNOSIS — C01 Malignant neoplasm of base of tongue: Secondary | ICD-10-CM | POA: Diagnosis not present

## 2021-08-28 DIAGNOSIS — Z510 Encounter for antineoplastic radiation therapy: Secondary | ICD-10-CM | POA: Diagnosis not present

## 2021-08-28 DIAGNOSIS — C61 Malignant neoplasm of prostate: Secondary | ICD-10-CM | POA: Diagnosis not present

## 2021-08-29 DIAGNOSIS — C61 Malignant neoplasm of prostate: Secondary | ICD-10-CM | POA: Diagnosis not present

## 2021-08-29 DIAGNOSIS — Z510 Encounter for antineoplastic radiation therapy: Secondary | ICD-10-CM | POA: Diagnosis not present

## 2021-08-29 DIAGNOSIS — C01 Malignant neoplasm of base of tongue: Secondary | ICD-10-CM | POA: Diagnosis not present

## 2021-09-01 ENCOUNTER — Other Ambulatory Visit: Payer: Self-pay

## 2021-09-01 ENCOUNTER — Inpatient Hospital Stay (INDEPENDENT_AMBULATORY_CARE_PROVIDER_SITE_OTHER): Payer: BC Managed Care – PPO | Admitting: Hematology and Oncology

## 2021-09-01 ENCOUNTER — Inpatient Hospital Stay: Payer: BC Managed Care – PPO

## 2021-09-01 ENCOUNTER — Other Ambulatory Visit: Payer: Self-pay | Admitting: Hematology and Oncology

## 2021-09-01 ENCOUNTER — Encounter: Payer: Self-pay | Admitting: Hematology and Oncology

## 2021-09-01 ENCOUNTER — Other Ambulatory Visit (HOSPITAL_COMMUNITY): Payer: Self-pay

## 2021-09-01 VITALS — BP 102/68 | HR 94 | Temp 98.0°F | Resp 18 | Ht 70.0 in | Wt 205.2 lb

## 2021-09-01 DIAGNOSIS — C01 Malignant neoplasm of base of tongue: Secondary | ICD-10-CM

## 2021-09-01 DIAGNOSIS — R110 Nausea: Secondary | ICD-10-CM | POA: Diagnosis not present

## 2021-09-01 DIAGNOSIS — E1169 Type 2 diabetes mellitus with other specified complication: Secondary | ICD-10-CM | POA: Diagnosis not present

## 2021-09-01 DIAGNOSIS — Z5111 Encounter for antineoplastic chemotherapy: Secondary | ICD-10-CM | POA: Diagnosis not present

## 2021-09-01 DIAGNOSIS — B20 Human immunodeficiency virus [HIV] disease: Secondary | ICD-10-CM | POA: Diagnosis not present

## 2021-09-01 DIAGNOSIS — Z79899 Other long term (current) drug therapy: Secondary | ICD-10-CM | POA: Diagnosis not present

## 2021-09-01 DIAGNOSIS — C61 Malignant neoplasm of prostate: Secondary | ICD-10-CM | POA: Diagnosis not present

## 2021-09-01 DIAGNOSIS — C029 Malignant neoplasm of tongue, unspecified: Secondary | ICD-10-CM | POA: Diagnosis not present

## 2021-09-01 DIAGNOSIS — I10 Essential (primary) hypertension: Secondary | ICD-10-CM | POA: Diagnosis not present

## 2021-09-01 DIAGNOSIS — Z510 Encounter for antineoplastic radiation therapy: Secondary | ICD-10-CM | POA: Diagnosis not present

## 2021-09-01 DIAGNOSIS — E119 Type 2 diabetes mellitus without complications: Secondary | ICD-10-CM | POA: Diagnosis not present

## 2021-09-01 MED ORDER — SODIUM CHLORIDE 0.9% FLUSH
10.0000 mL | Freq: Once | INTRAVENOUS | Status: AC | PRN
  Administered 2021-09-01: 10 mL

## 2021-09-01 MED ORDER — HEPARIN SOD (PORK) LOCK FLUSH 100 UNIT/ML IV SOLN
500.0000 [IU] | Freq: Once | INTRAVENOUS | Status: AC | PRN
  Administered 2021-09-01: 500 [IU]

## 2021-09-01 MED ORDER — SODIUM CHLORIDE 0.9 % IV SOLN: Freq: Once | INTRAVENOUS | Status: AC

## 2021-09-01 MED ORDER — ONDANSETRON HCL 4 MG/2ML IJ SOLN
8.0000 mg | Freq: Once | INTRAMUSCULAR | Status: AC
Start: 2021-09-01 — End: 2021-09-01
  Administered 2021-09-01: 8 mg via INTRAVENOUS
  Filled 2021-09-01: qty 4

## 2021-09-01 MED ORDER — ONDANSETRON 4 MG PO TBDP
4.0000 mg | ORAL_TABLET | ORAL | 0 refills | Status: DC | PRN
  Filled 2021-09-01: qty 90, 23d supply, fill #0

## 2021-09-01 NOTE — Assessment & Plan Note (Signed)
He is experiencing increasing nausea without vomiting. He is having difficulty swallowing his nausea meds, so we will order dissolvable ondansetron. He is also orthostatic today. He will go for 1 liter of normal saline with zofran IV in the infusion center. He knows to continue with daily radiation and follow up with Dr. Bobby Rumpf as scheduled.

## 2021-09-01 NOTE — Progress Notes (Signed)
[facility]  [address] [place],  [place]  [address] [phone]  Clinic Day:  09/01/2021  Referring physician: Jeanie Sewer, NP  ASSESSMENT & PLAN:   Assessment & Plan: Malignant neoplasm of base of tongue [facility]) He is experiencing increasing nausea without vomiting. He is having difficulty swallowing his nausea meds, so we will order dissolvable ondansetron. He is also orthostatic today. He will go for 1 liter of normal saline with zofran IV in the infusion center. He knows to continue with daily radiation and follow up with Dr. Bobby Rumpf as scheduled.    The patient understands the plans discussed today and is in agreement with them.  He knows to contact our office if he develops concerns prior to his next appointment.   Melodye Ped, NP  [facility] [address] [place] [address] Dept: [phone] Dept Fax: [phone]   No orders of the defined types were placed in this encounter.     CHIEF COMPLAINT:  CC: A 60 year old male with history of malignancy of base of tongue currently undergoing concurrent chemo/radiation here for symptom management  Current Treatment:  Cisplatin/ radiation therapy   HISTORY OF PRESENT ILLNESS:   Oncology History  Malignant neoplasm of base of tongue ([place])  07/04/2021 Initial Diagnosis   Malignant neoplasm of base of tongue ([place])   07/28/2021 Cancer Staging   Staging form: Pharynx - HPV-Mediated Oropharynx, AJCC 8th Edition - Clinical stage from 07/28/2021: Stage I (cT2, cN1, cM0, p16+) - Signed by Marice Potter, MD on 07/28/2021 Histopathologic type: Squamous cell carcinoma, NOS Stage prefix: Initial diagnosis   08/05/2021 -  Chemotherapy    Patient is on Treatment Plan: HEAD/NECK CISPLATIN + XRT Q21D          INTERVAL HISTORY:  Ricardo [name] is here today for symptom management. He has had ongoing nausea without relief  over the weekend. He has not experienced any vomiting, but is fearful of that. He is having difficulty swallowing his nausea meds. He has been using his feeding tube for supplement. He denies fever or chills. He denies shortness of breath, chest pain or cough. He denies issue with bowel or bladder.   REVIEW OF SYSTEMS:  Review of Systems  Constitutional:  Positive for fatigue. Negative for appetite change, chills, diaphoresis, fever and unexpected weight change.  HENT:   Negative for hearing loss, lump/mass, mouth sores, nosebleeds, sore throat, tinnitus, trouble swallowing and voice change.   Eyes:  Negative for eye problems and icterus.  Respiratory:  Negative for chest tightness, cough, hemoptysis, shortness of breath and wheezing.   Cardiovascular:  Negative for chest pain, leg swelling and palpitations.  Gastrointestinal:  Positive for nausea. Negative for abdominal distention, abdominal pain, blood in stool, constipation, diarrhea, rectal pain and vomiting.  Endocrine: Negative for hot flashes.  Genitourinary:  Negative for bladder incontinence, difficulty urinating, dyspareunia, dysuria, frequency, hematuria and nocturia.   Musculoskeletal:  Negative for arthralgias, back pain, flank pain, gait problem, myalgias, neck pain and neck stiffness.  Skin:  Negative for itching, rash and wound.  Neurological:  Negative for dizziness, extremity weakness, gait problem, headaches, light-headedness, numbness, seizures and speech difficulty.  Hematological:  Negative for adenopathy. Does not bruise/bleed easily.  Psychiatric/Behavioral:  Negative for confusion, decreased concentration, depression, sleep disturbance and suicidal ideas. The patient is not nervous/anxious.     VITALS:  Blood pressure 130/79, pulse 88, temperature 98.9 F (37.2 C), temperature  source Oral.  Wt Readings from Last 3 Encounters:  09/01/21 205 lb 4 oz (93.1 kg)  08/26/21 210 lb (95.3 kg)  08/22/21 212 lb 12.8 oz (96.5 kg)     There is no height or weight on file to calculate BMI.  Performance status (ECOG): 1 - Symptomatic but completely ambulatory  PHYSICAL EXAM:  Physical Exam Constitutional:      General: He is not in acute distress.    Appearance: Normal appearance. He is normal weight. He is not ill-appearing, toxic-appearing or diaphoretic.  HENT:     Head: Normocephalic and atraumatic.     Nose: Nose normal. No congestion or rhinorrhea.     Mouth/Throat:     Pharynx: Oropharynx is clear. No oropharyngeal exudate or posterior oropharyngeal erythema.  Eyes:     General: No scleral icterus.       Right eye: No discharge.        Left eye: No discharge.     Extraocular Movements: Extraocular movements intact.     Conjunctiva/sclera: Conjunctivae normal.     Pupils: Pupils are equal, round, and reactive to light.  Neck:     Vascular: No carotid bruit.  Cardiovascular:     Rate and Rhythm: Normal rate and regular rhythm.     Heart sounds: No murmur heard.   No friction rub. No gallop.  Pulmonary:     Effort: Pulmonary effort is normal. No respiratory distress.     Breath sounds: Normal breath sounds. No stridor. No wheezing, rhonchi or rales.  Chest:     Chest wall: No tenderness.  Abdominal:     General: Abdomen is flat. Bowel sounds are normal. There is no distension.     Palpations: There is no mass.     Tenderness: There is no abdominal tenderness. There is no right CVA tenderness, left CVA tenderness, guarding or rebound.     Hernia: No hernia is present.  Musculoskeletal:        General: No swelling, tenderness, deformity or signs of injury. Normal range of motion.     Cervical back: Normal range of motion and neck supple. No rigidity or tenderness.     Right lower leg: No edema.     Left lower leg: No edema.  Lymphadenopathy:     Cervical: No cervical adenopathy.  Skin:    General: Skin is warm and dry.     Capillary Refill: Capillary refill takes less than 2 seconds.      Coloration: Skin is not jaundiced or pale.     Findings: No bruising, erythema, lesion or rash.  Neurological:     General: No focal deficit present.     Mental Status: He is alert and oriented to person, place, and time. Mental status is at baseline.     Cranial Nerves: No cranial nerve deficit.     Sensory: No sensory deficit.     Motor: No weakness.     Coordination: Coordination normal.     Gait: Gait normal.     Deep Tendon Reflexes: Reflexes normal.  Psychiatric:        Mood and Affect: Mood normal.        Behavior: Behavior normal.        Thought Content: Thought content normal.        Judgment: Judgment normal.   LABS:   CBC Latest Ref Rng & Units 08/22/2021 08/14/2021 08/04/2021  WBC - 3.8 4.7 4.4  Hemoglobin 13.5 - 17.5 13.1(A) 13.7 13.5  Hematocrit 41 - 53 38(A) 41.2 39(A)  Platelets 150 - 399 231 244 250   CMP Latest Ref Rng & Units 08/22/2021 08/14/2021 08/04/2021  Glucose 65 - 99 mg/dL - 102(H) -  BUN 4 - _0 22(A)  Creatinine 0.6 - 1.3 0.8 1.00 0.8  Sodium 137 - 147 135(A) 135 138  Potassium 3.4 - 5.3 3.9 4.1 3.9  Chloride 99 - 108 102 100 108  CO2 13 - _1 Calcium 8.7 - 10.7 9.3 9.5 9.1  Total Protein 6.1 - 8.1 g/dL - 7.9 -  Total Bilirubin 0.2 - 1.2 mg/dL - 0.4 -  Alkaline Phos 25 - 125 68 - 59  AST 14 - 40 46(A) 35 33  ALT 10 - 40 _2 No results found for: CEA1 / No results found for: CEA1 No results found for: PSA1 No results found for: UOH729 No results found for: MSX115  No results found for: TOTALPROTELP, ALBUMINELP, A1GS, A2GS, BETS, BETA2SER, GAMS, MSPIKE, SPEI No results found for: TIBC, FERRITIN, IRONPCTSAT No results found for: LDH  STUDIES:  No results found.    HISTORY:   Past Medical History:  Diagnosis Date   Abnormal liver function tests 11/16/2016   Diabetes mellitus type 2 in obese ([place]) 11/20/2015   Diabetes mellitus without complication ([facility])    Diabetes type 2, uncontrolled ([place])    HIV (human  immunodeficiency virus infection) ([place])    Hyperlipidemia    LVH (left ventricular hypertrophy)    Obesity 05/08/2019   Palpitations    Rash and nonspecific skin eruption 09/15/2016   Thyroid mass 08/14/2021    No past surgical history on file.  Family History  Problem Relation Age of Onset   Coronary artery disease Father 62    Social History:  reports that he has never smoked. He has never used smokeless tobacco. He reports that he does not drink alcohol and does not use drugs.The patient is alone  today.  Allergies:  Allergies  Allergen Reactions   Amoxicillin-Pot Clavulanate    Amoxicillin-Pot Clavulanate     Current Medications: Current Outpatient Medications  Medication Sig Dispense Refill   amitriptyline (ELAVIL) 25 MG tablet TAKE 1 TABLET BY MOUTH ONCE DAILY AT BEDTIME 30 tablet 4   amitriptyline (ELAVIL) 25 MG tablet Take 1 tablet by mouth at bedtime.     Blood Glucose Monitoring Suppl (ONE TOUCH ULTRA 2) W/DEVICE KIT Use to check blood sugars 1/day. 1 each 0   diclofenac (VOLTAREN) 75 MG EC tablet Take 75 mg by mouth 2 (two) times daily.     Dulaglutide (TRULICITY) 4.5 ZM/0.8YE SOPN Inject 1 pen under the skin once weekly as directed 2 mL 3   emtricitabine-tenofovir AF (DESCOVY) 200-25 MG tablet TAKE 1 TABLET BY MOUTH DAILY. 30 tablet 4   Etravirine 200 MG TABS TAKE 2 TABLETS (400 MG TOTAL) BY MOUTH DAILY. 60 tablet 4   FARXIGA 10 MG TABS tablet Take 10 mg by mouth daily.     fenofibrate 160 MG tablet TAKE 1 TABLET BY MOUTH ONCE A DAY 90 tablet 1   fenofibrate 160 MG tablet Take 1 tablet by mouth daily 90 tablet 1   fenofibrate 160 MG tablet Take 1 tablet by mouth daily.     glipiZIDE (GLUCOTROL XL) 5 MG 24 hr tablet Take 2 tablets by mouth daily 180 tablet 0   glucose blood (ONE TOUCH ULTRA TEST) test strip Use to check blood  sugars three times daily. 100 each 11   lisinopril (ZESTRIL) 2.5 MG tablet Take 1 tablet by mouth daily for kidney protection (DM) 90 tablet 1    lisinopril (ZESTRIL) 2.5 MG tablet Take 1 (one) Tablet daily, for kidney protection (DM) 90 tablet 1   magic mouthwash (nystatin, lidocaine, diphenhydrAMINE, alum & mag hydroxide) suspension Swish and swallow 5 mls by mouth 4 times daily as needed for pain 240 mL 2   metoprolol succinate (TOPROL-XL) 100 MG 24 hr tablet TAKE 1 TABLET BY MOUTH ONCE A DAY WITH OR IMMEDIATELY FOLLOWING A MEAL (NEEDS APPT) 30 tablet 4   nystatin (MYCOSTATIN) 100000 UNIT/ML suspension Take 5 mLs by mouth 4 (four) times daily.     ondansetron (ZOFRAN) 4 MG tablet Take 1 tablet (4 mg total) by mouth every 4 (four) hours as needed for nausea. 90 tablet 3   ondansetron (ZOFRAN-ODT) 4 MG disintegrating tablet Dissolve 1 tablet in mouth every 4 hours as needed for nausea or vomiting. 90 tablet 0   oxyCODONE-acetaminophen (ROXICET) 5-325 MG/5ML solution Take 5 mLs by mouth every 4 (four) hours as needed for severe pain. 140 mL 0   prochlorperazine (COMPAZINE) 10 MG tablet Take 1 tablet (10 mg total) by mouth every 6 (six) hours as needed for nausea or vomiting. 90 tablet 3   rosuvastatin (CRESTOR) 20 MG tablet TAKE 1 TABLET BY MOUTH ONCE A DAY 30 tablet 4   rosuvastatin (CRESTOR) 20 MG tablet Take 1 tablet by mouth daily.     sulfamethoxazole-trimethoprim (BACTRIM) 200-40 MG/5ML suspension Place 20 mLs into feeding tube daily. 100 mL 6   No current facility-administered medications for this visit.   Facility-Administered Medications Ordered in Other Visits  Medication Dose Route Frequency Provider Last Rate Last Admin   0.9 %  sodium chloride infusion   Intravenous Once [place] Scrape A, NP 999 mL/hr at 09/01/21 0950 New Bag at 09/01/21 0950   heparin lock flush 100 unit/mL  500 Units Intracatheter Once PRN [place] Scrape A, NP       sodium chloride flush (NS) 0.9 % injection 10 mL  10 mL Intracatheter Once PRN Melodye Ped, NP

## 2021-09-01 NOTE — Patient Instructions (Signed)

## 2021-09-01 NOTE — Addendum Note (Signed)
Addended by: Juanetta Beets on: 09/01/2021 09:34 AM   Modules accepted: Orders

## 2021-09-02 ENCOUNTER — Other Ambulatory Visit: Payer: Self-pay

## 2021-09-02 ENCOUNTER — Encounter: Payer: Self-pay | Admitting: Oncology

## 2021-09-02 DIAGNOSIS — C61 Malignant neoplasm of prostate: Secondary | ICD-10-CM | POA: Diagnosis not present

## 2021-09-02 DIAGNOSIS — Z510 Encounter for antineoplastic radiation therapy: Secondary | ICD-10-CM | POA: Diagnosis not present

## 2021-09-03 ENCOUNTER — Other Ambulatory Visit (HOSPITAL_COMMUNITY): Payer: Self-pay

## 2021-09-03 DIAGNOSIS — C61 Malignant neoplasm of prostate: Secondary | ICD-10-CM | POA: Diagnosis not present

## 2021-09-03 DIAGNOSIS — C01 Malignant neoplasm of base of tongue: Secondary | ICD-10-CM | POA: Diagnosis not present

## 2021-09-03 DIAGNOSIS — Z510 Encounter for antineoplastic radiation therapy: Secondary | ICD-10-CM | POA: Diagnosis not present

## 2021-09-04 ENCOUNTER — Telehealth: Payer: Self-pay | Admitting: Hematology and Oncology

## 2021-09-04 DIAGNOSIS — C61 Malignant neoplasm of prostate: Secondary | ICD-10-CM | POA: Diagnosis not present

## 2021-09-04 DIAGNOSIS — C01 Malignant neoplasm of base of tongue: Secondary | ICD-10-CM | POA: Diagnosis not present

## 2021-09-04 DIAGNOSIS — Z510 Encounter for antineoplastic radiation therapy: Secondary | ICD-10-CM | POA: Diagnosis not present

## 2021-09-04 NOTE — Telephone Encounter (Signed)
NO LOS Entered

## 2021-09-05 DIAGNOSIS — C61 Malignant neoplasm of prostate: Secondary | ICD-10-CM | POA: Diagnosis not present

## 2021-09-05 DIAGNOSIS — C01 Malignant neoplasm of base of tongue: Secondary | ICD-10-CM | POA: Diagnosis not present

## 2021-09-05 DIAGNOSIS — Z510 Encounter for antineoplastic radiation therapy: Secondary | ICD-10-CM | POA: Diagnosis not present

## 2021-09-06 ENCOUNTER — Encounter: Payer: Self-pay | Admitting: Hematology and Oncology

## 2021-09-06 ENCOUNTER — Encounter: Payer: Self-pay | Admitting: Oncology

## 2021-09-08 ENCOUNTER — Inpatient Hospital Stay: Payer: BC Managed Care – PPO

## 2021-09-08 ENCOUNTER — Other Ambulatory Visit (HOSPITAL_COMMUNITY): Payer: Self-pay

## 2021-09-08 ENCOUNTER — Other Ambulatory Visit: Payer: Self-pay | Admitting: Pharmacist

## 2021-09-08 ENCOUNTER — Inpatient Hospital Stay (INDEPENDENT_AMBULATORY_CARE_PROVIDER_SITE_OTHER): Payer: BC Managed Care – PPO | Admitting: Hematology and Oncology

## 2021-09-08 ENCOUNTER — Other Ambulatory Visit: Payer: Self-pay

## 2021-09-08 VITALS — BP 116/71 | HR 73 | Resp 18 | Ht 70.0 in | Wt 191.8 lb

## 2021-09-08 DIAGNOSIS — C01 Malignant neoplasm of base of tongue: Secondary | ICD-10-CM | POA: Diagnosis not present

## 2021-09-08 DIAGNOSIS — E119 Type 2 diabetes mellitus without complications: Secondary | ICD-10-CM | POA: Diagnosis not present

## 2021-09-08 DIAGNOSIS — R110 Nausea: Secondary | ICD-10-CM | POA: Diagnosis not present

## 2021-09-08 DIAGNOSIS — C61 Malignant neoplasm of prostate: Secondary | ICD-10-CM | POA: Diagnosis not present

## 2021-09-08 DIAGNOSIS — Z510 Encounter for antineoplastic radiation therapy: Secondary | ICD-10-CM | POA: Diagnosis not present

## 2021-09-08 DIAGNOSIS — Z5111 Encounter for antineoplastic chemotherapy: Secondary | ICD-10-CM | POA: Diagnosis not present

## 2021-09-08 DIAGNOSIS — Z79899 Other long term (current) drug therapy: Secondary | ICD-10-CM | POA: Diagnosis not present

## 2021-09-08 MED ORDER — ONDANSETRON HCL 4 MG/2ML IJ SOLN
8.0000 mg | Freq: Once | INTRAMUSCULAR | Status: AC
  Administered 2021-09-08: 8 mg via INTRAVENOUS
  Filled 2021-09-08: qty 4

## 2021-09-08 MED ORDER — SODIUM CHLORIDE 0.9% FLUSH: 10.0000 mL | Freq: Once | INTRAVENOUS | Status: DC | PRN

## 2021-09-08 MED ORDER — SODIUM CHLORIDE 0.9 % IV SOLN: Freq: Once | INTRAVENOUS | Status: AC

## 2021-09-08 MED ORDER — HEPARIN SOD (PORK) LOCK FLUSH 100 UNIT/ML IV SOLN
500.0000 [IU] | Freq: Once | INTRAVENOUS | Status: AC | PRN
  Administered 2021-09-08: 500 [IU]

## 2021-09-08 MED ORDER — OMEPRAZOLE 2 MG/ML ORAL SUSPENSION
20.0000 mg | Freq: Every day | ORAL | 1 refills | Status: DC
  Filled 2021-09-08: qty 300, 30d supply, fill #0

## 2021-09-08 MED ORDER — SODIUM CHLORIDE 0.9 % IV SOLN: Freq: Once | INTRAVENOUS | Status: DC

## 2021-09-08 NOTE — Progress Notes (Signed)
[facility]  [address] [place],  [place]  [address] [phone]  Clinic Day:  09/15/2021  Referring physician: Rush Farmer, MD  This document serves as a record of services personally performed by Shykeem Resurreccion Macarthur Critchley, MD. It was created on their behalf by [facility] E, a trained medical scribe. The creation of this record is based on the scribe's personal observations and the provider's statements to them.  HISTORY OF PRESENT ILLNESS:  The patient is a 60 y.o. 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  He is receiving chemoradiation with the chemotherapy portion consisting of 3 cycles of cisplatin.  He comes in today prior to a 3rd and final cycle of chemotherapy.  Since his last visit, the patient has been doing okay.  Odynophagia and left neck pain remain issues, for which he requests some form of local therapy to ameliorate his discomfort.  On a positive note, he clearly believes his cervical lymphadenopathy has decreased.      PHYSICAL EXAM:  Blood pressure 134/75, pulse 91, temperature 98.3 F (36.8 C), temperature source Oral, resp. rate 18, height '5\' 10"'$  (1.778 m), weight 224 lb 3.2 oz (101.7 kg), SpO2 97 %. Wt Readings from Last 3 Encounters:  07/04/21 224 lb 3.2 oz (101.7 kg)  05/08/19 250 lb (113.4 kg)  08/24/18 232 lb (105.2 kg)   Body mass index is 32.17 kg/m. Performance status (ECOG): 1 - Symptomatic but completely ambulatory  Physical Exam Constitutional:      Appearance: Normal appearance. He is not ill-appearing.  HENT:     Mouth/Throat:     Mouth: Mucous membranes are moist.     Pharynx: Oropharynx is clear. No oropharyngeal exudate or posterior oropharyngeal erythema.  Cardiovascular:     Rate and Rhythm: Normal rate and regular rhythm.     Heart sounds: No murmur heard.   No friction rub. No gallop.  Pulmonary:     Effort: Pulmonary effort is normal. No respiratory distress.     Breath  sounds: Normal breath sounds. No wheezing, rhonchi or rales.  Chest:  Breasts:    Right: No axillary adenopathy or supraclavicular adenopathy.     Left: No axillary adenopathy or supraclavicular adenopathy.  Abdominal:     General: Bowel sounds are normal. There is no distension.     Palpations: Abdomen is soft. There is no mass.     Tenderness: no abdominal tenderness  Musculoskeletal:        General: No swelling.     Right lower leg: No edema.     Left lower leg: No edema.  Lymphadenopathy:     Cervical: Cervical adenopathy (level 2 node palpated, but smaller at 2 cm) present.     Right cervical: No superficial cervical adenopathy.    Left cervical: Superficial cervical adenopathy (level 2 lymph node palpated, but smaller at 2 cm) present.     Upper Body:     Right upper body: No supraclavicular or axillary adenopathy.     Left upper body: No supraclavicular or axillary adenopathy.     Lower Body: No right inguinal adenopathy. No left inguinal adenopathy.  Skin:    General: Skin is warm.     Coloration: Skin is not jaundiced.     Findings: No lesion or rash.  Neurological:     General: No focal deficit present.     Mental Status: He is alert and oriented to person, place, and time. Mental status is  at baseline.     Cranial Nerves: Cranial nerves are intact.  Psychiatric:        Mood and Affect: Mood normal.        Behavior: Behavior normal.        Thought Content: Thought content normal.   LABS:  Ref. Range 09/15/2021 00:00  Sodium Latest Ref Range: 137 - 147  137  Potassium Latest Ref Range: 3.4 - 5.3  3.9  Chloride Latest Ref Range: 99 - 108  97 (A)  CO2 Latest Ref Range: 13 - 22  31 (A)  Glucose Unknown 136  BUN Latest Ref Range: 4 - 21  20  Creatinine Latest Ref Range: 0.6 - 1.3  1.1  Calcium Latest Ref Range: 8.7 - 10.7  9.6  Magnesium Unknown 2.2  Alkaline Phosphatase Latest Ref Range: 25 - 125  83  Albumin Latest Ref Range: 3.5 - 5.0  4.2  AST Latest Ref Range:  14 - 40  29  ALT Latest Ref Range: 10 - 40  14  Bilirubin, Total Unknown 0.5  WBC Unknown 1.4  RBC Latest Ref Range: 3.87 - 5.11  3.93  Hemoglobin Latest Ref Range: 13.5 - 17.5  12.1 (A)  HCT Latest Ref Range: 41 - 53  35 (A)  Platelets Latest Ref Range: 150 - 399  324  NEUT# Unknown 0.56    ASSESSMENT & PLAN:  A 60 y.o. 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  Due to his low white count, his 3rd and final cycle of cisplatin will be delayed for one week.  He will continue to receive his daily radiation.  Clinically, he appears to be doing okay.  We will check for a flavored lidocaine formulation to help with his mouth pain, which is what he requests.   Otherwise, I will see this gentleman back in early November to see how he is doing 6 weeks out from his chemoradiation.  He also understands a PET scan will be ordered 3 months after the completion of his chemoradiation.  The patient understands all the plans discussed today and is in agreement with them.    I, Rita Ohara, am acting as scribe for Marice Potter, MD    I have reviewed this report as typed by the medical scribe, and it is complete and accurate.  Karam Dunson Macarthur Critchley, MD

## 2021-09-08 NOTE — Progress Notes (Signed)
[place]  [address] [place],  [place]  [address] [phone]  Clinic Day:  09/10/2021  Referring physician: Jeanie Sewer, NP  ASSESSMENT & PLAN:   Assessment & Plan: Malignant neoplasm of base of tongue ([place]) Sore mouth and throat secondary to radiation. Difficulty using PEG due to acid reflux despite trying both Ensure and Boost. Maalox temporarily effective. Rx Prilosec susp 20 mg daily. Gave samples of Glucerna supplement to see if better tolerated. Mildly orthostatic, will give IV fluids today. We will see him back as scheduled on September 26th for repeat clinical assessment prior to his 3rd and final cycle of cisplatin.   The patient understands the plans discussed today and is in agreement with them.  He knows to contact our office if he develops concerns prior to his next appointment.     Marvia Pickles, PA-C  [facility] [address] [place] [address] Dept: [phone] Dept Fax: [phone]   No orders of the defined types were placed in this encounter.     CHIEF COMPLAINT:  CC:  Feeling dehydrated, acid reflux  Current Treatment:   Concurrent radiation and chemotherapy with cisplatin every 3 weeks   HISTORY OF PRESENT ILLNESS:   Oncology History  Malignant neoplasm of base of tongue ([place])  07/04/2021 Initial Diagnosis   Malignant neoplasm of base of tongue ([place])   07/28/2021 Cancer Staging   Staging form: Pharynx - HPV-Mediated Oropharynx, AJCC 8th Edition - Clinical stage from 07/28/2021: Stage I (cT2, cN1, cM0, p16+) - Signed by Marice Potter, MD on 07/28/2021 Histopathologic type: Squamous cell carcinoma, NOS Stage prefix: Initial diagnosis   08/05/2021 -  Chemotherapy    Patient is on Treatment Plan: HEAD/NECK CISPLATIN + XRT Q21D          INTERVAL HISTORY:  [place] is added to the schedule today as he feels he needs IV fluids.   He reports severe heartburn, so has only taking 3 cans of formula per day.  He is using Maalox with temporary relief.  He has tried both Ensure and Boost.  He takes water through his feeding tube, but cannot really say how much. He denies fevers or chills. He denies pain. His appetite is good. His weight has decreased 7 pounds over last 1 week .  REVIEW OF SYSTEMS:  Review of Systems  Constitutional:  Negative for appetite change, chills, fatigue, fever and unexpected weight change.  HENT:   Positive for mouth sores, sore throat and trouble swallowing. Negative for lump/mass.   Respiratory:  Negative for cough and shortness of breath.   Cardiovascular:  Negative for chest pain and leg swelling.  Gastrointestinal:  Positive for nausea (mild). Negative for abdominal pain, constipation, diarrhea and vomiting.  Genitourinary:  Negative for difficulty urinating, dysuria, frequency and hematuria.   Musculoskeletal:  Negative for arthralgias, back pain and myalgias.  Skin:  Negative for itching, rash and wound.  Neurological:  Negative for dizziness, extremity weakness, headaches, light-headedness and numbness.  Hematological:  Negative for adenopathy.  Psychiatric/Behavioral:  Negative for depression and sleep disturbance. The patient is not nervous/anxious.     VITALS:  Blood pressure 135/74, pulse 83, temperature 98.7 F (37.1 C), temperature source Oral, resp. rate 18, height 5' 10"  (1.778 m), weight 196 lb 12.8 oz (89.3 kg), SpO2 98 %.  Wt Readings from Last 3 Encounters:  09/08/21 191 lb 12.8 oz (87 kg)  09/08/21 196 lb  12.8 oz (89.3 kg)  09/01/21 205 lb 4 oz (93.1 kg)    Body mass index is 28.24 kg/m.  Performance status (ECOG): 2 - Symptomatic, <50% confined to bed  PHYSICAL EXAM:  Physical Exam Vitals and nursing note reviewed.  Constitutional:      General: He is not in acute distress.    Appearance: Normal appearance.  HENT:     Mouth/Throat:     Mouth: Mucous membranes are  dry.     Pharynx: Posterior oropharyngeal erythema (moderate to severe mucositis of the post pharynx and left palate) present.  Cardiovascular:     Rate and Rhythm: Normal rate and regular rhythm.     Heart sounds: Normal heart sounds. No murmur heard.   No friction rub. No gallop.  Pulmonary:     Breath sounds: Normal breath sounds. No wheezing, rhonchi or rales.  Musculoskeletal:     Cervical back: Normal range of motion and neck supple.     Right lower leg: No edema.     Left lower leg: No edema.  Lymphadenopathy:     Cervical: No cervical adenopathy.  Skin:    General: Skin is warm and dry.     Coloration: Skin is not jaundiced.  Neurological:     Mental Status: He is alert and oriented to person, place, and time.    LABS:   CBC Latest Ref Rng & Units 08/22/2021 08/14/2021 08/04/2021  WBC - 3.8 4.7 4.4  Hemoglobin 13.5 - 17.5 13.1(A) 13.7 13.5  Hematocrit 41 - 53 38(A) 41.2 39(A)  Platelets 150 - 399 231 244 250   CMP Latest Ref Rng & Units 08/22/2021 08/14/2021 08/04/2021  Glucose 65 - 99 mg/dL - 102(H) -  BUN 4 - 21 14 23  22(A)  Creatinine 0.6 - 1.3 0.8 1.00 0.8  Sodium 137 - 147 135(A) 135 138  Potassium 3.4 - 5.3 3.9 4.1 3.9  Chloride 99 - 108 102 100 108  CO2 13 - 22 20 25 20   Calcium 8.7 - 10.7 9.3 9.5 9.1  Total Protein 6.1 - 8.1 g/dL - 7.9 -  Total Bilirubin 0.2 - 1.2 mg/dL - 0.4 -  Alkaline Phos 25 - 125 68 - 59  AST 14 - 40 46(A) 35 33  ALT 10 - 40 29 29 18      No results found for: CEA1 / No results found for: CEA1 No results found for: PSA1 No results found for: ERD408 No results found for: XKG818  No results found for: TOTALPROTELP, ALBUMINELP, A1GS, A2GS, BETS, BETA2SER, GAMS, MSPIKE, SPEI No results found for: TIBC, FERRITIN, IRONPCTSAT No results found for: LDH  STUDIES:  No results found.    HISTORY:   Past Medical History:  Diagnosis Date   Abnormal liver function tests 11/16/2016   Diabetes mellitus type 2 in obese ([place]) 11/20/2015    Diabetes mellitus without complication (H[facility])    Diabetes type 2, uncontrolled ([place])    HIV (human immunodeficiency virus infection) ([place])    Hyperlipidemia    LVH (left ventricular hypertrophy)    Obesity 05/08/2019   Palpitations    Rash and nonspecific skin eruption 09/15/2016   Thyroid mass 08/14/2021    History reviewed. No pertinent surgical history.  Family History  Problem Relation Age of Onset   Coronary artery disease Father 70    Social History:  reports that he has never smoked. He has never used smokeless tobacco. He reports that he does not drink alcohol and does not use  drugs.The patient is alone today.  Allergies:  Allergies  Allergen Reactions   Amoxicillin-Pot Clavulanate    Amoxicillin-Pot Clavulanate     Current Medications: Current Outpatient Medications  Medication Sig Dispense Refill   omeprazole (FIRST-OMEPRAZOLE) 2 mg/mL SUSP oral suspension Place 10 mLs (20 mg total) into feeding tube daily. 300 mL 1   amitriptyline (ELAVIL) 25 MG tablet Take 1 tablet by mouth at bedtime.     Blood Glucose Monitoring Suppl (ONE TOUCH ULTRA 2) W/DEVICE KIT Use to check blood sugars 1/day. 1 each 0   diclofenac (VOLTAREN) 75 MG EC tablet Take 75 mg by mouth 2 (two) times daily.     Dulaglutide (TRULICITY) 4.5 GD/9.2EQ SOPN Inject 1 pen under the skin once weekly as directed 2 mL 3   emtricitabine-tenofovir AF (DESCOVY) 200-25 MG tablet TAKE 1 TABLET BY MOUTH DAILY. 30 tablet 4   Etravirine 200 MG TABS TAKE 2 TABLETS (400 MG TOTAL) BY MOUTH DAILY. 60 tablet 4   FARXIGA 10 MG TABS tablet Take 10 mg by mouth daily.     fenofibrate 160 MG tablet Take 1 tablet by mouth daily.     glipiZIDE (GLUCOTROL XL) 5 MG 24 hr tablet Take 2 tablets by mouth daily 180 tablet 0   glucose blood (ONE TOUCH ULTRA TEST) test strip Use to check blood sugars three times daily. 100 each 11   lidocaine (XYLOCAINE) 2 % solution Use as directed 15 mLs in the mouth or throat every 4 (four) hours as  needed for mouth pain. Swallow 56m qid prn mouth pain     lidocaine (XYLOCAINE) 2 % solution Take 15 MLs (1 tablespoonfull) by mouth 4 times a day (swallow) 300 mL 3   lisinopril (ZESTRIL) 2.5 MG tablet Take 1 (one) Tablet daily, for kidney protection (DM) 90 tablet 1   magic mouthwash (nystatin, lidocaine, diphenhydrAMINE, alum & mag hydroxide) suspension Swish and swallow 5 mls by mouth 4 times daily as needed for pain 240 mL 2   metoprolol succinate (TOPROL-XL) 100 MG 24 hr tablet TAKE 1 TABLET BY MOUTH ONCE A DAY WITH OR IMMEDIATELY FOLLOWING A MEAL (NEEDS APPT) 30 tablet 4   nystatin (MYCOSTATIN) 100000 UNIT/ML suspension Take 5 mLs by mouth 4 (four) times daily.     ondansetron (ZOFRAN-ODT) 4 MG disintegrating tablet Dissolve 1 tablet in mouth every 4 hours as needed for nausea or vomiting. 90 tablet 0   oxyCODONE-acetaminophen (ROXICET) 5-325 MG/5ML solution Take 5 mLs by mouth every 4 (four) hours as needed for severe pain. 140 mL 0   prochlorperazine (COMPAZINE) 10 MG tablet Take 1 tablet (10 mg total) by mouth every 6 (six) hours as needed for nausea or vomiting. 90 tablet 3   rosuvastatin (CRESTOR) 20 MG tablet Take 1 tablet by mouth daily.     sulfamethoxazole-trimethoprim (BACTRIM) 200-40 MG/5ML suspension Place 20 mLs into feeding tube daily. 100 mL 6   No current facility-administered medications for this visit.

## 2021-09-08 NOTE — Assessment & Plan Note (Signed)
Sore mouth and throat secondary to radiation. Difficulty using PEG due to acid reflux despite trying both Ensure and Boost. Maalox temporarily effective. Rx Prilosec susp 20 mg daily. Gave samples of Glucerna supplement to see if better tolerated. Mildly orthostatic, will give IV fluids today. We will see him back as scheduled on September 26th for repeat clinical assessment prior to his 3rd and final cycle of cisplatin.

## 2021-09-08 NOTE — Progress Notes (Signed)
1230:PT STABLE AT TIME OF DISCHARGE ?

## 2021-09-08 NOTE — Patient Instructions (Signed)

## 2021-09-08 NOTE — Progress Notes (Signed)
NA

## 2021-09-09 ENCOUNTER — Encounter: Payer: Self-pay | Admitting: Oncology

## 2021-09-09 ENCOUNTER — Other Ambulatory Visit (HOSPITAL_COMMUNITY): Payer: Self-pay

## 2021-09-09 ENCOUNTER — Encounter: Payer: Self-pay | Admitting: Hematology and Oncology

## 2021-09-09 DIAGNOSIS — C61 Malignant neoplasm of prostate: Secondary | ICD-10-CM | POA: Diagnosis not present

## 2021-09-09 DIAGNOSIS — Z510 Encounter for antineoplastic radiation therapy: Secondary | ICD-10-CM | POA: Diagnosis not present

## 2021-09-09 DIAGNOSIS — C01 Malignant neoplasm of base of tongue: Secondary | ICD-10-CM | POA: Diagnosis not present

## 2021-09-09 MED ORDER — LIDOCAINE VISCOUS HCL 2 % MT SOLN
OROMUCOSAL | 3 refills | Status: DC
  Filled 2021-09-09: qty 100, 2d supply, fill #0

## 2021-09-10 ENCOUNTER — Encounter: Payer: Self-pay | Admitting: Oncology

## 2021-09-10 ENCOUNTER — Encounter: Payer: Self-pay | Admitting: Hematology and Oncology

## 2021-09-10 DIAGNOSIS — C61 Malignant neoplasm of prostate: Secondary | ICD-10-CM | POA: Diagnosis not present

## 2021-09-10 DIAGNOSIS — C01 Malignant neoplasm of base of tongue: Secondary | ICD-10-CM | POA: Diagnosis not present

## 2021-09-10 DIAGNOSIS — Z510 Encounter for antineoplastic radiation therapy: Secondary | ICD-10-CM | POA: Diagnosis not present

## 2021-09-11 DIAGNOSIS — C61 Malignant neoplasm of prostate: Secondary | ICD-10-CM | POA: Diagnosis not present

## 2021-09-11 DIAGNOSIS — C01 Malignant neoplasm of base of tongue: Secondary | ICD-10-CM | POA: Diagnosis not present

## 2021-09-11 DIAGNOSIS — Z510 Encounter for antineoplastic radiation therapy: Secondary | ICD-10-CM | POA: Diagnosis not present

## 2021-09-12 ENCOUNTER — Other Ambulatory Visit (HOSPITAL_COMMUNITY): Payer: Self-pay

## 2021-09-12 DIAGNOSIS — C61 Malignant neoplasm of prostate: Secondary | ICD-10-CM | POA: Diagnosis not present

## 2021-09-12 DIAGNOSIS — Z510 Encounter for antineoplastic radiation therapy: Secondary | ICD-10-CM | POA: Diagnosis not present

## 2021-09-12 DIAGNOSIS — C01 Malignant neoplasm of base of tongue: Secondary | ICD-10-CM | POA: Diagnosis not present

## 2021-09-13 ENCOUNTER — Other Ambulatory Visit (HOSPITAL_COMMUNITY): Payer: Self-pay

## 2021-09-15 ENCOUNTER — Encounter: Payer: Self-pay | Admitting: Oncology

## 2021-09-15 ENCOUNTER — Inpatient Hospital Stay (INDEPENDENT_AMBULATORY_CARE_PROVIDER_SITE_OTHER): Payer: BC Managed Care – PPO | Admitting: Oncology

## 2021-09-15 ENCOUNTER — Other Ambulatory Visit (HOSPITAL_COMMUNITY): Payer: Self-pay

## 2021-09-15 ENCOUNTER — Inpatient Hospital Stay: Payer: BC Managed Care – PPO

## 2021-09-15 ENCOUNTER — Other Ambulatory Visit: Payer: Self-pay | Admitting: Hematology and Oncology

## 2021-09-15 VITALS — BP 115/69 | HR 86 | Temp 99.1°F | Resp 16 | Ht 70.0 in | Wt 195.9 lb

## 2021-09-15 DIAGNOSIS — C01 Malignant neoplasm of base of tongue: Secondary | ICD-10-CM

## 2021-09-15 DIAGNOSIS — Z510 Encounter for antineoplastic radiation therapy: Secondary | ICD-10-CM | POA: Diagnosis not present

## 2021-09-15 DIAGNOSIS — C61 Malignant neoplasm of prostate: Secondary | ICD-10-CM | POA: Diagnosis not present

## 2021-09-15 LAB — CBC: RBC: 3.93 (ref 3.87–5.11)

## 2021-09-15 LAB — BASIC METABOLIC PANEL
BUN: 20 (ref 4–21)
CO2: 31 — AB (ref 13–22)
Chloride: 97 — AB (ref 99–108)
Creatinine: 1.1 (ref 0.6–1.3)
Glucose: 136
Potassium: 3.9 (ref 3.4–5.3)
Sodium: 137 (ref 137–147)

## 2021-09-15 LAB — CBC AND DIFFERENTIAL
HCT: 35 — AB (ref 41–53)
Hemoglobin: 12.1 — AB (ref 13.5–17.5)
Neutrophils Absolute: 0.56
Platelets: 324 (ref 150–399)
WBC: 1.4

## 2021-09-15 LAB — MAGNESIUM: Magnesium, Serum: 2.2

## 2021-09-15 LAB — HEPATIC FUNCTION PANEL
ALT: 14 (ref 10–40)
AST: 29 (ref 14–40)
Alkaline Phosphatase: 83 (ref 25–125)
Bilirubin, Total: 0.5

## 2021-09-15 LAB — COMPREHENSIVE METABOLIC PANEL
Albumin: 4.2 (ref 3.5–5.0)
Calcium: 9.6 (ref 8.7–10.7)

## 2021-09-15 MED FILL — Dexamethasone Sodium Phosphate Inj 100 MG/10ML: INTRAMUSCULAR | Qty: 1 | Status: AC

## 2021-09-15 MED FILL — Fosaprepitant Dimeglumine For IV Infusion 150 MG (Base Eq): INTRAVENOUS | Qty: 5 | Status: AC

## 2021-09-16 ENCOUNTER — Ambulatory Visit: Payer: BC Managed Care – PPO

## 2021-09-16 DIAGNOSIS — C01 Malignant neoplasm of base of tongue: Secondary | ICD-10-CM | POA: Diagnosis not present

## 2021-09-16 DIAGNOSIS — Z510 Encounter for antineoplastic radiation therapy: Secondary | ICD-10-CM | POA: Diagnosis not present

## 2021-09-16 DIAGNOSIS — C61 Malignant neoplasm of prostate: Secondary | ICD-10-CM | POA: Diagnosis not present

## 2021-09-17 ENCOUNTER — Telehealth: Payer: Self-pay

## 2021-09-17 ENCOUNTER — Other Ambulatory Visit (HOSPITAL_COMMUNITY): Payer: Self-pay

## 2021-09-17 ENCOUNTER — Ambulatory Visit: Payer: BC Managed Care – PPO | Admitting: Infectious Disease

## 2021-09-17 ENCOUNTER — Ambulatory Visit: Payer: BC Managed Care – PPO

## 2021-09-17 ENCOUNTER — Other Ambulatory Visit: Payer: Self-pay | Admitting: Hematology and Oncology

## 2021-09-17 DIAGNOSIS — C01 Malignant neoplasm of base of tongue: Secondary | ICD-10-CM | POA: Diagnosis not present

## 2021-09-17 DIAGNOSIS — M542 Cervicalgia: Secondary | ICD-10-CM

## 2021-09-17 DIAGNOSIS — C61 Malignant neoplasm of prostate: Secondary | ICD-10-CM | POA: Diagnosis not present

## 2021-09-17 DIAGNOSIS — Z510 Encounter for antineoplastic radiation therapy: Secondary | ICD-10-CM | POA: Diagnosis not present

## 2021-09-17 DIAGNOSIS — R590 Localized enlarged lymph nodes: Secondary | ICD-10-CM

## 2021-09-17 MED ORDER — OXYCODONE-ACETAMINOPHEN 5-325 MG/5ML PO SOLN
5.0000 mL | ORAL | 0 refills | Status: DC | PRN
  Filled 2021-09-17: qty 140, 5d supply, fill #0

## 2021-09-17 NOTE — Telephone Encounter (Signed)
Patient has not showed for his 11:15am appointment today. I called, no answer, left a voicemail for patient to return call.

## 2021-09-18 DIAGNOSIS — C61 Malignant neoplasm of prostate: Secondary | ICD-10-CM | POA: Diagnosis not present

## 2021-09-18 DIAGNOSIS — Z510 Encounter for antineoplastic radiation therapy: Secondary | ICD-10-CM | POA: Diagnosis not present

## 2021-09-18 DIAGNOSIS — C01 Malignant neoplasm of base of tongue: Secondary | ICD-10-CM | POA: Diagnosis not present

## 2021-09-19 DIAGNOSIS — Z510 Encounter for antineoplastic radiation therapy: Secondary | ICD-10-CM | POA: Diagnosis not present

## 2021-09-19 DIAGNOSIS — C01 Malignant neoplasm of base of tongue: Secondary | ICD-10-CM | POA: Diagnosis not present

## 2021-09-19 DIAGNOSIS — C61 Malignant neoplasm of prostate: Secondary | ICD-10-CM | POA: Diagnosis not present

## 2021-09-22 ENCOUNTER — Other Ambulatory Visit (HOSPITAL_COMMUNITY): Payer: Self-pay

## 2021-09-22 ENCOUNTER — Other Ambulatory Visit: Payer: Self-pay | Admitting: Pharmacist

## 2021-09-22 ENCOUNTER — Other Ambulatory Visit: Payer: Self-pay | Admitting: Hematology and Oncology

## 2021-09-22 ENCOUNTER — Inpatient Hospital Stay: Payer: BC Managed Care – PPO | Attending: Oncology

## 2021-09-22 ENCOUNTER — Telehealth: Payer: Self-pay

## 2021-09-22 DIAGNOSIS — Z923 Personal history of irradiation: Secondary | ICD-10-CM | POA: Diagnosis not present

## 2021-09-22 DIAGNOSIS — C01 Malignant neoplasm of base of tongue: Secondary | ICD-10-CM | POA: Diagnosis not present

## 2021-09-22 DIAGNOSIS — Z510 Encounter for antineoplastic radiation therapy: Secondary | ICD-10-CM | POA: Diagnosis not present

## 2021-09-22 DIAGNOSIS — Z5111 Encounter for antineoplastic chemotherapy: Secondary | ICD-10-CM | POA: Insufficient documentation

## 2021-09-22 LAB — CBC
MCV: 92 (ref 80–94)
RBC: 3.91 (ref 3.87–5.11)

## 2021-09-22 LAB — BASIC METABOLIC PANEL WITH GFR
BUN: 20 (ref 4–21)
CO2: 30 — AB (ref 13–22)
Chloride: 94 — AB (ref 99–108)
Creatinine: 1.1 (ref 0.6–1.3)
Glucose: 241
Potassium: 3.7 (ref 3.4–5.3)
Sodium: 131 — AB (ref 137–147)

## 2021-09-22 LAB — HEPATIC FUNCTION PANEL
ALT: 14 (ref 10–40)
AST: 31 (ref 14–40)
Alkaline Phosphatase: 84 (ref 25–125)
Bilirubin, Total: 0.4

## 2021-09-22 LAB — COMPREHENSIVE METABOLIC PANEL WITH GFR
Albumin: 4 (ref 3.5–5.0)
Calcium: 9.2 (ref 8.7–10.7)

## 2021-09-22 LAB — CBC AND DIFFERENTIAL
HCT: 36 — AB (ref 41–53)
Hemoglobin: 12.3 — AB (ref 13.5–17.5)
Neutrophils Absolute: 2.21
Platelets: 285 (ref 150–399)
WBC: 3.5

## 2021-09-22 LAB — MAGNESIUM: Magnesium: 2.4 — AB (ref 1.6–2.3)

## 2021-09-22 MED FILL — Dexamethasone Sodium Phosphate Inj 100 MG/10ML: INTRAMUSCULAR | Qty: 1 | Status: AC

## 2021-09-22 MED FILL — Cisplatin Inj 100 MG/100ML (1 MG/ML): INTRAVENOUS | Qty: 221 | Status: AC

## 2021-09-22 MED FILL — Fosaprepitant Dimeglumine For IV Infusion 150 MG (Base Eq): INTRAVENOUS | Qty: 5 | Status: AC

## 2021-09-22 NOTE — Telephone Encounter (Signed)
Dr Bobby Rumpf reviewed pts Ricardo [name], which is 2.21. He states, "he is good to take treatment tomorrow".

## 2021-09-23 ENCOUNTER — Inpatient Hospital Stay: Payer: BC Managed Care – PPO

## 2021-09-23 ENCOUNTER — Other Ambulatory Visit: Payer: Self-pay

## 2021-09-23 ENCOUNTER — Other Ambulatory Visit: Payer: Self-pay | Admitting: Hematology and Oncology

## 2021-09-23 DIAGNOSIS — Z510 Encounter for antineoplastic radiation therapy: Secondary | ICD-10-CM | POA: Diagnosis not present

## 2021-09-23 DIAGNOSIS — Z923 Personal history of irradiation: Secondary | ICD-10-CM | POA: Diagnosis not present

## 2021-09-23 DIAGNOSIS — Z5111 Encounter for antineoplastic chemotherapy: Secondary | ICD-10-CM | POA: Diagnosis not present

## 2021-09-23 DIAGNOSIS — C01 Malignant neoplasm of base of tongue: Secondary | ICD-10-CM

## 2021-09-23 MED ORDER — HEPARIN SOD (PORK) LOCK FLUSH 100 UNIT/ML IV SOLN
500.0000 [IU] | Freq: Once | INTRAVENOUS | Status: AC | PRN
  Administered 2021-09-23: 500 [IU]

## 2021-09-23 MED ORDER — SODIUM CHLORIDE 0.9 % IV SOLN
100.0000 mg/m2 | Freq: Once | INTRAVENOUS | Status: AC
  Administered 2021-09-23: 221 mg via INTRAVENOUS
  Filled 2021-09-23: qty 200

## 2021-09-23 MED ORDER — PALONOSETRON HCL INJECTION 0.25 MG/5ML
0.2500 mg | Freq: Once | INTRAVENOUS | Status: AC
  Administered 2021-09-23: 0.25 mg via INTRAVENOUS
  Filled 2021-09-23: qty 5

## 2021-09-23 MED ORDER — HYDROMORPHONE HCL 1 MG/ML IJ SOLN
1.0000 mg | Freq: Once | INTRAMUSCULAR | Status: AC
  Administered 2021-09-23: 1 mg via INTRAVENOUS
  Filled 2021-09-23: qty 1

## 2021-09-23 MED ORDER — SODIUM CHLORIDE 0.9 % IV SOLN
10.0000 mg | Freq: Once | INTRAVENOUS | Status: AC
  Administered 2021-09-23: 10 mg via INTRAVENOUS
  Filled 2021-09-23: qty 10

## 2021-09-23 MED ORDER — POTASSIUM CHLORIDE IN NACL 20-0.9 MEQ/L-% IV SOLN
Freq: Once | INTRAVENOUS | Status: AC
  Filled 2021-09-23: qty 1000

## 2021-09-23 MED ORDER — SODIUM CHLORIDE 0.9% FLUSH
10.0000 mL | INTRAVENOUS | Status: DC | PRN
  Administered 2021-09-23: 10 mL

## 2021-09-23 MED ORDER — SODIUM CHLORIDE 0.9 % IV SOLN: Freq: Once | INTRAVENOUS | Status: AC

## 2021-09-23 MED ORDER — SODIUM CHLORIDE 0.9 % IV SOLN
150.0000 mg | Freq: Once | INTRAVENOUS | Status: AC
  Administered 2021-09-23: 150 mg via INTRAVENOUS
  Filled 2021-09-23: qty 150

## 2021-09-23 NOTE — Patient Instructions (Signed)
Cisplatin injection What is this medication? CISPLATIN (SIS pla tin) is a chemotherapy drug. It targets fast dividing cells, like cancer cells, and causes these cells to die. This medicine is used to treat many types of cancer like bladder, ovarian, and testicular cancers. This medicine may be used for other purposes; ask your health care provider or pharmacist if you have questions. COMMON BRAND NAME(S): Platinol, Platinol -AQ What should I tell my care team before I take this medication? They need to know if you have any of these conditions: eye disease, vision problems hearing problems kidney disease low blood counts, like white cells, platelets, or red blood cells tingling of the fingers or toes, or other nerve disorder an unusual or allergic reaction to cisplatin, carboplatin, oxaliplatin, other medicines, foods, dyes, or preservatives pregnant or trying to get pregnant breast-feeding How should I use this medication? This drug is given as an infusion into a vein. It is administered in a hospital or clinic by a specially trained health care professional. Talk to your pediatrician regarding the use of this medicine in children. Special care may be needed. Overdosage: If you think you have taken too much of this medicine contact a poison control center or emergency room at once. NOTE: This medicine is only for you. Do not share this medicine with others. What if I miss a dose? It is important not to miss a dose. Call your doctor or health care professional if you are unable to keep an appointment. What may interact with this medication? This medicine may interact with the following medications: foscarnet certain antibiotics like amikacin, gentamicin, neomycin, polymyxin B, streptomycin, tobramycin, vancomycin This list may not describe all possible interactions. Give your health care provider a list of all the medicines, herbs, non-prescription drugs, or dietary supplements you use. Also  tell them if you smoke, drink alcohol, or use illegal drugs. Some items may interact with your medicine. What should I watch for while using this medication? Your condition will be monitored carefully while you are receiving this medicine. You will need important blood work done while you are taking this medicine. This drug may make you feel generally unwell. This is not uncommon, as chemotherapy can affect healthy cells as well as cancer cells. Report any side effects. Continue your course of treatment even though you feel ill unless your doctor tells you to stop. This medicine may increase your risk of getting an infection. Call your healthcare professional for advice if you get a fever, chills, or sore throat, or other symptoms of a cold or flu. Do not treat yourself. Try to avoid being around people who are sick. Avoid taking medicines that contain aspirin, acetaminophen, ibuprofen, naproxen, or ketoprofen unless instructed by your healthcare professional. These medicines may hide a fever. This medicine may increase your risk to bruise or bleed. Call your doctor or health care professional if you notice any unusual bleeding. Be careful brushing and flossing your teeth or using a toothpick because you may get an infection or bleed more easily. If you have any dental work done, tell your dentist you are receiving this medicine. Do not become pregnant while taking this medicine or for 14 months after stopping it. Women should inform their healthcare professional if they wish to become pregnant or think they might be pregnant. Men should not father a child while taking this medicine and for 11 months after stopping it. There is potential for serious side effects to an unborn child. Talk to your healthcare  professional for more information. Do not breast-feed an infant while taking this medicine. This medicine has caused ovarian failure in some women. This medicine may make it more difficult to get  pregnant. Talk to your healthcare professional if you are concerned about your fertility. This medicine has caused decreased sperm counts in some men. This may make it more difficult to father a child. Talk to your healthcare professional if you are concerned about your fertility. Drink fluids as directed while you are taking this medicine. This will help protect your kidneys. Call your doctor or health care professional if you get diarrhea. Do not treat yourself. What side effects may I notice from receiving this medication? Side effects that you should report to your doctor or health care professional as soon as possible: allergic reactions like skin rash, itching or hives, swelling of the face, lips, or tongue blurred vision changes in vision decreased hearing or ringing of the ears nausea, vomiting pain, redness, or irritation at site where injected pain, tingling, numbness in the hands or feet signs and symptoms of bleeding such as bloody or black, tarry stools; red or dark brown urine; spitting up blood or brown material that looks like coffee grounds; red spots on the skin; unusual bruising or bleeding from the eyes, gums, or nose signs and symptoms of infection like fever; chills; cough; sore throat; pain or trouble passing urine signs and symptoms of kidney injury like trouble passing urine or change in the amount of urine signs and symptoms of low red blood cells or anemia such as unusually weak or tired; feeling faint or lightheaded; falls; breathing problems Side effects that usually do not require medical attention (report to your doctor or health care professional if they continue or are bothersome): loss of appetite mouth sores muscle cramps This list may not describe all possible side effects. Call your doctor for medical advice about side effects. You may report side effects to FDA at 1-800-FDA-1088. Where should I keep my medication? This drug is given in a hospital or clinic  and will not be stored at home. NOTE: This sheet is a summary. It may not cover all possible information. If you have questions about this medicine, talk to your doctor, pharmacist, or health care provider.  2022 Elsevier/Gold Standard (2018-12-02 15:59:17)

## 2021-09-24 ENCOUNTER — Other Ambulatory Visit (HOSPITAL_COMMUNITY): Payer: Self-pay

## 2021-09-24 DIAGNOSIS — Z510 Encounter for antineoplastic radiation therapy: Secondary | ICD-10-CM | POA: Diagnosis not present

## 2021-09-24 DIAGNOSIS — C01 Malignant neoplasm of base of tongue: Secondary | ICD-10-CM | POA: Diagnosis not present

## 2021-09-28 DIAGNOSIS — Z880 Allergy status to penicillin: Secondary | ICD-10-CM | POA: Diagnosis not present

## 2021-09-28 DIAGNOSIS — R531 Weakness: Secondary | ICD-10-CM | POA: Diagnosis not present

## 2021-09-28 DIAGNOSIS — E7800 Pure hypercholesterolemia, unspecified: Secondary | ICD-10-CM | POA: Diagnosis not present

## 2021-09-28 DIAGNOSIS — R911 Solitary pulmonary nodule: Secondary | ICD-10-CM | POA: Diagnosis not present

## 2021-09-28 DIAGNOSIS — M1990 Unspecified osteoarthritis, unspecified site: Secondary | ICD-10-CM | POA: Diagnosis not present

## 2021-09-28 DIAGNOSIS — Z21 Asymptomatic human immunodeficiency virus [HIV] infection status: Secondary | ICD-10-CM | POA: Diagnosis not present

## 2021-09-28 DIAGNOSIS — E119 Type 2 diabetes mellitus without complications: Secondary | ICD-10-CM | POA: Diagnosis not present

## 2021-09-28 DIAGNOSIS — N400 Enlarged prostate without lower urinary tract symptoms: Secondary | ICD-10-CM | POA: Diagnosis not present

## 2021-09-28 DIAGNOSIS — E43 Unspecified severe protein-calorie malnutrition: Secondary | ICD-10-CM | POA: Diagnosis not present

## 2021-09-28 DIAGNOSIS — E860 Dehydration: Secondary | ICD-10-CM | POA: Diagnosis not present

## 2021-09-28 DIAGNOSIS — Z79899 Other long term (current) drug therapy: Secondary | ICD-10-CM | POA: Diagnosis not present

## 2021-09-28 DIAGNOSIS — N179 Acute kidney failure, unspecified: Secondary | ICD-10-CM | POA: Diagnosis not present

## 2021-09-28 DIAGNOSIS — Z20822 Contact with and (suspected) exposure to covid-19: Secondary | ICD-10-CM | POA: Diagnosis not present

## 2021-09-28 DIAGNOSIS — C01 Malignant neoplasm of base of tongue: Secondary | ICD-10-CM | POA: Diagnosis not present

## 2021-09-28 DIAGNOSIS — R42 Dizziness and giddiness: Secondary | ICD-10-CM | POA: Diagnosis not present

## 2021-09-28 DIAGNOSIS — R112 Nausea with vomiting, unspecified: Secondary | ICD-10-CM | POA: Diagnosis not present

## 2021-09-28 DIAGNOSIS — E876 Hypokalemia: Secondary | ICD-10-CM | POA: Diagnosis not present

## 2021-09-28 DIAGNOSIS — I10 Essential (primary) hypertension: Secondary | ICD-10-CM | POA: Diagnosis not present

## 2021-09-28 DIAGNOSIS — Z6825 Body mass index (BMI) 25.0-25.9, adult: Secondary | ICD-10-CM | POA: Diagnosis not present

## 2021-09-28 DIAGNOSIS — D61810 Antineoplastic chemotherapy induced pancytopenia: Secondary | ICD-10-CM | POA: Diagnosis not present

## 2021-10-01 ENCOUNTER — Other Ambulatory Visit (HOSPITAL_COMMUNITY): Payer: Self-pay

## 2021-10-01 ENCOUNTER — Ambulatory Visit: Payer: BC Managed Care – PPO | Admitting: Infectious Disease

## 2021-10-06 ENCOUNTER — Other Ambulatory Visit (HOSPITAL_COMMUNITY): Payer: Self-pay

## 2021-10-06 MED ORDER — TWOCAL HN PO LIQD: ORAL | 0 refills | Status: DC

## 2021-10-06 MED ORDER — POTASSIUM CHLORIDE 20 MEQ/15ML (10%) PO SOLN
ORAL | 0 refills | Status: DC
  Filled 2021-10-06: qty 900, 30d supply, fill #0

## 2021-10-06 MED ORDER — MAGNESIUM OXIDE -MG SUPPLEMENT 400 (240 MG) MG PO TABS
ORAL_TABLET | ORAL | 0 refills | Status: DC
  Filled 2021-10-06: qty 60, 30d supply, fill #0

## 2021-10-06 MED ORDER — FLUCONAZOLE 200 MG PO TABS
ORAL_TABLET | ORAL | 0 refills | Status: DC
  Filled 2021-10-06: qty 5, 5d supply, fill #0

## 2021-10-06 MED ORDER — PROMETHAZINE HCL 6.25 MG/5ML PO SYRP
ORAL_SOLUTION | ORAL | 1 refills | Status: DC
  Filled 2021-10-06: qty 200, 7d supply, fill #0
  Filled 2021-10-23: qty 200, 7d supply, fill #1

## 2021-10-07 ENCOUNTER — Encounter: Payer: Self-pay | Admitting: Hematology and Oncology

## 2021-10-07 ENCOUNTER — Encounter: Payer: Self-pay | Admitting: Oncology

## 2021-10-07 ENCOUNTER — Telehealth: Payer: Self-pay

## 2021-10-07 ENCOUNTER — Other Ambulatory Visit (HOSPITAL_COMMUNITY): Payer: Self-pay

## 2021-10-07 NOTE — Telephone Encounter (Signed)
[facility] Patient Advocate Encounter   Received notification from [facility] that prior authorization for Descovy is required.   PA submitted on 10/07/21 Key [id number] Status is pending    [facility] will continue to follow.   Ileene Patrick, [facility] Patient [facility] for [facility] Phone: [phone] Fax:  [phone]

## 2021-10-08 ENCOUNTER — Other Ambulatory Visit: Payer: Self-pay | Admitting: Oncology

## 2021-10-08 ENCOUNTER — Other Ambulatory Visit: Payer: Self-pay | Admitting: Hematology and Oncology

## 2021-10-08 ENCOUNTER — Inpatient Hospital Stay (INDEPENDENT_AMBULATORY_CARE_PROVIDER_SITE_OTHER): Payer: BC Managed Care – PPO | Admitting: Oncology

## 2021-10-08 ENCOUNTER — Other Ambulatory Visit (HOSPITAL_COMMUNITY): Payer: Self-pay

## 2021-10-08 ENCOUNTER — Inpatient Hospital Stay: Payer: BC Managed Care – PPO

## 2021-10-08 ENCOUNTER — Telehealth: Payer: Self-pay | Admitting: Oncology

## 2021-10-08 VITALS — BP 100/69 | HR 105 | Temp 98.3°F | Resp 18 | Ht 70.0 in | Wt 181.4 lb

## 2021-10-08 DIAGNOSIS — C01 Malignant neoplasm of base of tongue: Secondary | ICD-10-CM

## 2021-10-08 DIAGNOSIS — Z923 Personal history of irradiation: Secondary | ICD-10-CM | POA: Diagnosis not present

## 2021-10-08 DIAGNOSIS — Z5111 Encounter for antineoplastic chemotherapy: Secondary | ICD-10-CM | POA: Diagnosis not present

## 2021-10-08 LAB — BASIC METABOLIC PANEL
BUN: 18 (ref 4–21)
CO2: 32 — AB (ref 13–22)
Chloride: 97 — AB (ref 99–108)
Creatinine: 1.3 (ref 0.6–1.3)
Glucose: 221
Potassium: 3.2 — AB (ref 3.4–5.3)
Sodium: 137 (ref 137–147)

## 2021-10-08 LAB — MAGNESIUM: Magnesium: 1.8

## 2021-10-08 LAB — HEPATIC FUNCTION PANEL
ALT: 18 (ref 10–40)
AST: 38 (ref 14–40)
Alkaline Phosphatase: 101 (ref 25–125)
Bilirubin, Total: 0.4

## 2021-10-08 LAB — CBC AND DIFFERENTIAL
HCT: 31 — AB (ref 41–53)
Hemoglobin: 10.5 — AB (ref 13.5–17.5)
Neutrophils Absolute: 1.54
Platelets: 281 (ref 150–399)
WBC: 2.4

## 2021-10-08 LAB — CBC: RBC: 3.41 — AB (ref 3.87–5.11)

## 2021-10-08 LAB — COMPREHENSIVE METABOLIC PANEL
Albumin: 4 (ref 3.5–5.0)
Calcium: 9.1 (ref 8.7–10.7)

## 2021-10-08 MED ORDER — OMEPRAZOLE 2 MG/ML ORAL SUSPENSION
20.0000 mg | Freq: Every day | ORAL | 1 refills | Status: DC
  Filled 2021-10-08: qty 300, 30d supply, fill #0

## 2021-10-08 MED ORDER — PANTOPRAZOLE SODIUM 40 MG PO PACK: 40.0000 mg | PACK | Freq: Every day | ORAL | 3 refills | Status: DC

## 2021-10-08 MED ORDER — OMEPRAZOLE 2 MG/ML ORAL SUSPENSION
20.0000 mg | Freq: Every day | ORAL | 0 refills | Status: DC
  Filled 2021-10-31: qty 300, 30d supply, fill #0

## 2021-10-08 NOTE — Telephone Encounter (Signed)
No LOS Entered today 10/19 bhr

## 2021-10-08 NOTE — Progress Notes (Signed)
[facility]  [address] [place],  [place]  [address] [phone]  Clinic Day:  10/08/2021  Referring physician: Rush Farmer, MD  This document serves as a record of services personally performed by Darrnell Mangiaracina Macarthur Critchley, MD. It was created on their behalf by [facility] E, a trained medical scribe. The creation of this record is based on the scribe's personal observations and the provider's statements to them.  HISTORY OF PRESENT ILLNESS:  The patient is a 60 y.o. 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  He just completed all of definitve chemoradiation, which consisted of 3 cycles of cisplatin.  However, since his last visit, the patient was hospitalized for severe mucositis, which led to malnutrition and dehydration.  Although he is doing better, his mucositis remains significant.  He has been using his feeding tube to maintain his caloric intake.  He remains frustrated from his radiatio, with the hope his mucositis improves quickly.    PHYSICAL EXAM:  Blood pressure 134/75, pulse 91, temperature 98.3 F (36.8 C), temperature source Oral, resp. rate 18, height 5\' 10"  (1.778 m), weight 224 lb 3.2 oz (101.7 kg), SpO2 97 %. Wt Readings from Last 3 Encounters:  07/04/21 224 lb 3.2 oz (101.7 kg)  05/08/19 250 lb (113.4 kg)  08/24/18 232 lb (105.2 kg)   Body mass index is 32.17 kg/m. Performance status (ECOG): 1 - Symptomatic but completely ambulatory  Physical Exam Constitutional:      Appearance: Normal appearance. He is not ill-appearing.  HENT:     Mouth/Throat:     Mouth: Mucous membranes are moist.     Pharynx: Oropharynx is clear. No oropharyngeal exudate or posterior oropharyngeal erythema.  Cardiovascular:     Rate and Rhythm: Normal rate and regular rhythm.     Heart sounds: No murmur heard.   No friction rub. No gallop.  Pulmonary:     Effort: Pulmonary effort is normal. No respiratory distress.      Breath sounds: Normal breath sounds. No wheezing, rhonchi or rales.  Chest:  Breasts:    Right: No axillary adenopathy or supraclavicular adenopathy.     Left: No axillary adenopathy or supraclavicular adenopathy.  Abdominal:     General: Bowel sounds are normal. There is no distension.     Palpations: Abdomen is soft. There is no mass.     Tenderness: no abdominal tenderness  Musculoskeletal:        General: No swelling.     Right lower leg: No edema.     Left lower leg: No edema.  Lymphadenopathy:     Right cervical: No superficial cervical adenopathy.    Left cervical: Superficial cervical adenopathy has resolved.     Upper Body:     Right upper body: No supraclavicular or axillary adenopathy.     Left upper body: No supraclavicular or axillary adenopathy.     Lower Body: No right inguinal adenopathy. No left inguinal adenopathy.  Skin:    General: Skin is warm.     Coloration: Skin is not jaundiced.     Findings: No lesion or rash.  Neurological:     General: No focal deficit present.     Mental Status: He is alert and oriented to person, place, and time. Mental status is at baseline.     Cranial Nerves: Cranial nerves are intact.  Psychiatric:        Mood and Affect: Mood normal.  Behavior: Behavior normal.        Thought Content: Thought content normal.   LABS:  Ref. Range 09/15/2021 00:00  Sodium Latest Ref Range: 137 - 147  137  Potassium Latest Ref Range: 3.4 - 5.3  3.9  Chloride Latest Ref Range: 99 - 108  97 (A)  CO2 Latest Ref Range: 13 - 22  31 (A)  Glucose Unknown 136  BUN Latest Ref Range: 4 - 21  20  Creatinine Latest Ref Range: 0.6 - 1.3  1.1  Calcium Latest Ref Range: 8.7 - 10.7  9.6  Magnesium Unknown 2.2  Alkaline Phosphatase Latest Ref Range: 25 - 125  83  Albumin Latest Ref Range: 3.5 - 5.0  4.2  AST Latest Ref Range: 14 - 40  29  ALT Latest Ref Range: 10 - 40  14  Bilirubin, Total Unknown 0.5  WBC Unknown 1.4  RBC Latest Ref Range: 3.87 -  5.11  3.93  Hemoglobin Latest Ref Range: 13.5 - 17.5  12.1 (A)  HCT Latest Ref Range: 41 - 53  35 (A)  Platelets Latest Ref Range: 150 - 399  324  NEUT# Unknown 0.56    ASSESSMENT & PLAN:  A 60 y.o. 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  Much of this visit was spent reassuring this gentleman that his current clinical picture will improve.  For now, he knows to use his feeding tube to maintain caloric intake until his mucositis improves to where he can begin taking food back in orally.  It appears he has had a good response to his chemoradiation.  I will see this gentleman back again in early November to see how he is doing 6 weeks out from his chemoradiation.  He also understands a PET scan will be ordered 3 months after the completion of his chemoradiation.  The patient understands all the plans discussed today and is in agreement with them.    I, Rita Ohara, am acting as scribe for Marice Potter, MD    I have reviewed this report as typed by the medical scribe, and it is complete and accurate.  Jacilyn Sanpedro Macarthur Critchley, MD

## 2021-10-09 ENCOUNTER — Other Ambulatory Visit (HOSPITAL_COMMUNITY): Payer: Self-pay

## 2021-10-09 ENCOUNTER — Encounter: Payer: Self-pay | Admitting: Hematology and Oncology

## 2021-10-09 ENCOUNTER — Other Ambulatory Visit: Payer: BC Managed Care – PPO

## 2021-10-09 LAB — T-HELPER CELLS (CD4) COUNT (NOT AT ARMC)
CD4 % Helper T Cell: 29 % — ABNORMAL LOW (ref 33–65)
CD4 T Cell Abs: 171 /uL — ABNORMAL LOW (ref 400–1790)

## 2021-10-10 ENCOUNTER — Other Ambulatory Visit (HOSPITAL_COMMUNITY): Payer: Self-pay

## 2021-10-13 ENCOUNTER — Other Ambulatory Visit (HOSPITAL_COMMUNITY): Payer: Self-pay

## 2021-10-14 ENCOUNTER — Encounter: Payer: Self-pay | Admitting: Oncology

## 2021-10-14 ENCOUNTER — Other Ambulatory Visit (HOSPITAL_COMMUNITY): Payer: Self-pay

## 2021-10-15 ENCOUNTER — Other Ambulatory Visit: Payer: BC Managed Care – PPO

## 2021-10-16 ENCOUNTER — Other Ambulatory Visit (HOSPITAL_COMMUNITY): Payer: Self-pay

## 2021-10-17 ENCOUNTER — Other Ambulatory Visit (HOSPITAL_COMMUNITY): Payer: Self-pay

## 2021-10-21 ENCOUNTER — Other Ambulatory Visit: Payer: Self-pay

## 2021-10-21 ENCOUNTER — Other Ambulatory Visit: Payer: BC Managed Care – PPO

## 2021-10-21 ENCOUNTER — Other Ambulatory Visit (HOSPITAL_COMMUNITY): Payer: Self-pay

## 2021-10-21 DIAGNOSIS — B20 Human immunodeficiency virus [HIV] disease: Secondary | ICD-10-CM

## 2021-10-22 ENCOUNTER — Encounter: Payer: Self-pay | Admitting: Hematology and Oncology

## 2021-10-22 ENCOUNTER — Other Ambulatory Visit (HOSPITAL_COMMUNITY): Payer: Self-pay

## 2021-10-22 ENCOUNTER — Telehealth: Payer: Self-pay

## 2021-10-22 ENCOUNTER — Encounter: Payer: Self-pay | Admitting: Oncology

## 2021-10-22 LAB — T-HELPER CELL (CD4) - (RCID CLINIC ONLY)
CD4 % Helper T Cell: 29 % — ABNORMAL LOW (ref 33–65)
CD4 T Cell Abs: 240 /uL — ABNORMAL LOW (ref 400–1790)

## 2021-10-22 MED ORDER — EMTRICITABINE-TENOFOVIR AF 200-25 MG PO TABS
1.0000 | ORAL_TABLET | ORAL | 0 refills | Status: DC
  Filled 2021-10-22: qty 30, 30d supply, fill #0

## 2021-10-22 NOTE — Telephone Encounter (Signed)
[facility] Patient Advocate Encounter  Completed and sent [employer] Advancing Access application for Descovy for this patient who is uninsured.    Patient is approved 10/22/21 through 12/20/21.     Ileene Patrick, [facility] Patient [facility] for [facility] Phone: [phone] Fax:  [phone]

## 2021-10-23 ENCOUNTER — Encounter: Payer: Self-pay | Admitting: Hematology and Oncology

## 2021-10-23 ENCOUNTER — Telehealth: Payer: Self-pay

## 2021-10-23 ENCOUNTER — Other Ambulatory Visit (HOSPITAL_COMMUNITY): Payer: Self-pay

## 2021-10-23 ENCOUNTER — Encounter: Payer: Self-pay | Admitting: Oncology

## 2021-10-23 DIAGNOSIS — R1310 Dysphagia, unspecified: Secondary | ICD-10-CM | POA: Diagnosis not present

## 2021-10-23 NOTE — Telephone Encounter (Signed)
Relayed test results to patient; instructed him to try and hydrate as he stated he has struggled with that recently. Did a swallow evaluation today and passed so he said he'll be able to eat solid foods. RN instructed patient to follow up with his PCP or his oncology team. He informed RN that his PCP has left the practice she was at; instructed him to try and find another one in meantime. Patient verbalized that he would reach out to oncology team.   Also wanted provider to know that "he's the man!"  Carlean Purl, RN

## 2021-10-23 NOTE — Telephone Encounter (Signed)
-----   Message from Truman Hayward, MD sent at 10/23/2021  3:07 PM EDT ----- Regarding: Kidney function is much worse this will not be due to his antivirals but needs to be addressed by PCP and/or hematology oncology  ----- Message ----- From: Cheyenne Adas Lab Results In Sent: 10/21/2021  11:07 PM EDT To: Truman Hayward, MD

## 2021-10-24 ENCOUNTER — Encounter: Payer: Self-pay | Admitting: Oncology

## 2021-10-24 LAB — COMPLETE METABOLIC PANEL WITH GFR
AG Ratio: 1.3 (calc) (ref 1.0–2.5)
ALT: 15 U/L (ref 9–46)
AST: 30 U/L (ref 10–35)
Albumin: 3.9 g/dL (ref 3.6–5.1)
Alkaline phosphatase (APISO): 77 U/L (ref 35–144)
BUN/Creatinine Ratio: 16 (calc) (ref 6–22)
BUN: 33 mg/dL — ABNORMAL HIGH (ref 7–25)
CO2: 28 mmol/L (ref 20–32)
Calcium: 9.3 mg/dL (ref 8.6–10.3)
Chloride: 90 mmol/L — ABNORMAL LOW (ref 98–110)
Creat: 2.06 mg/dL — ABNORMAL HIGH (ref 0.70–1.35)
Globulin: 2.9 g/dL (calc) (ref 1.9–3.7)
Glucose, Bld: 237 mg/dL — ABNORMAL HIGH (ref 65–99)
Potassium: 4 mmol/L (ref 3.5–5.3)
Sodium: 128 mmol/L — ABNORMAL LOW (ref 135–146)
Total Bilirubin: 0.4 mg/dL (ref 0.2–1.2)
Total Protein: 6.8 g/dL (ref 6.1–8.1)
eGFR: 36 mL/min/{1.73_m2} — ABNORMAL LOW (ref >=60)

## 2021-10-24 LAB — CBC WITH DIFFERENTIAL/PLATELET
Absolute Monocytes: 296 cells/uL (ref 200–950)
Basophils Absolute: 20 cells/uL (ref 0–200)
Basophils Relative: 0.6 %
Eosinophils Absolute: 41 cells/uL (ref 15–500)
Eosinophils Relative: 1.2 %
HCT: 28.3 % — ABNORMAL LOW (ref 38.5–50.0)
Hemoglobin: 10 g/dL — ABNORMAL LOW (ref 13.2–17.1)
Lymphs Abs: 792 cells/uL — ABNORMAL LOW (ref 850–3900)
MCH: 33.6 pg — ABNORMAL HIGH (ref 27.0–33.0)
MCHC: 35.3 g/dL (ref 32.0–36.0)
MCV: 95 fL (ref 80.0–100.0)
MPV: 8.6 fL (ref 7.5–12.5)
Monocytes Relative: 8.7 %
Neutro Abs: 2251 cells/uL (ref 1500–7800)
Neutrophils Relative %: 66.2 %
Platelets: 232 10*3/uL (ref 140–400)
RBC: 2.98 10*6/uL — ABNORMAL LOW (ref 4.20–5.80)
RDW: 16.9 % — ABNORMAL HIGH (ref 11.0–15.0)
Total Lymphocyte: 23.3 %
WBC: 3.4 10*3/uL — ABNORMAL LOW (ref 3.8–10.8)

## 2021-10-24 LAB — HIV-1 RNA QUANT-NO REFLEX-BLD
HIV 1 RNA Quant: NOT DETECTED Copies/mL
HIV-1 RNA Quant, Log: NOT DETECTED Log cps/mL

## 2021-10-27 ENCOUNTER — Other Ambulatory Visit (HOSPITAL_COMMUNITY): Payer: Self-pay

## 2021-10-27 ENCOUNTER — Encounter: Payer: Self-pay | Admitting: Hematology and Oncology

## 2021-10-27 ENCOUNTER — Encounter: Payer: Self-pay | Admitting: Oncology

## 2021-10-31 ENCOUNTER — Encounter: Payer: Self-pay | Admitting: Hematology and Oncology

## 2021-10-31 ENCOUNTER — Other Ambulatory Visit (HOSPITAL_COMMUNITY): Payer: Self-pay

## 2021-10-31 ENCOUNTER — Encounter: Payer: Self-pay | Admitting: Oncology

## 2021-11-04 ENCOUNTER — Encounter: Payer: Self-pay | Admitting: Oncology

## 2021-11-04 ENCOUNTER — Encounter: Payer: Self-pay | Admitting: Hematology and Oncology

## 2021-11-04 ENCOUNTER — Ambulatory Visit: Payer: BC Managed Care – PPO | Admitting: Oncology

## 2021-11-04 ENCOUNTER — Other Ambulatory Visit: Payer: BC Managed Care – PPO

## 2021-11-04 NOTE — Progress Notes (Signed)
[facility]  [address] [place],  [place]  [address] [phone]  Clinic Day:  11/11/2021  Referring physician: Rush Farmer, MD  This document serves as a record of services personally performed by Regino Fournet Macarthur Critchley, MD. It was created on their behalf by [facility] E, a trained medical scribe. The creation of this record is based on the scribe's personal observations and the provider's statements to them.  HISTORY OF PRESENT ILLNESS:  The patient is a 60 y.o. 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 He completed all of definitve chemoradiation in October 2022, which consisted of 3 cycles of cisplatin. He comes in today for routine follow up.  Since his last visit, the patient has felt much better.  He is now back to tolerating all food orally.  His previous mucositis has essentially dissipated.  He denies having any new symptoms or findings concerning for early disease recurrence.    PHYSICAL EXAM:  Blood pressure 134/75, pulse 91, temperature 98.3 F (36.8 C), temperature source Oral, resp. rate 18, height 5\' 10"  (1.778 m), weight 224 lb 3.2 oz (101.7 kg), SpO2 97 %. Wt Readings from Last 3 Encounters:  07/04/21 224 lb 3.2 oz (101.7 kg)  05/08/19 250 lb (113.4 kg)  08/24/18 232 lb (105.2 kg)   Body mass index is 32.17 kg/m. Performance status (ECOG): 1 - Symptomatic but completely ambulatory  Physical Exam Constitutional:      Appearance: Normal appearance. He is not ill-appearing.  HENT:     Mouth/Throat:     Mouth: Mucous membranes are moist.     Pharynx: Oropharynx is clear. No oropharyngeal exudate or posterior oropharyngeal erythema.  Cardiovascular:     Rate and Rhythm: Normal rate and regular rhythm.     Heart sounds: No murmur heard.   No friction rub. No gallop.  Pulmonary:     Effort: Pulmonary effort is normal. No respiratory distress.     Breath sounds: Normal breath sounds. No wheezing,  rhonchi or rales.  Chest:  Breasts:    Right: No axillary adenopathy or supraclavicular adenopathy.     Left: No axillary adenopathy or supraclavicular adenopathy.  Abdominal:     General: Bowel sounds are normal. There is no distension.     Palpations: Abdomen is soft. There is no mass.     Tenderness: no abdominal tenderness  Musculoskeletal:        General: No swelling.     Right lower leg: No edema.     Left lower leg: No edema.  Lymphadenopathy:     Right cervical: No superficial cervical adenopathy.    Left cervical: Superficial cervical adenopathy has resolved.     Upper Body:     Right upper body: No supraclavicular or axillary adenopathy.     Left upper body: No supraclavicular or axillary adenopathy.     Lower Body: No right inguinal adenopathy. No left inguinal adenopathy.  Skin:    General: Skin is warm.     Coloration: Skin is not jaundiced.     Findings: No lesion or rash.  Neurological:     General: No focal deficit present.     Mental Status: He is alert and oriented to person, place, and time. Mental status is at baseline.     Cranial Nerves: Cranial nerves are intact.  Psychiatric:        Mood and Affect: Mood normal.        Behavior: Behavior normal.  Thought Content: Thought content normal.   LABS:  Latest Reference Range & Units 11/11/21 00:00  Sodium 137 - 147  136 !  Potassium 3.4 - 5.3  3.8  Chloride 99 - 108  101  CO2 13 - 22  25 !  Glucose  147  BUN 4 - 21  33 !  Creatinine 0.6 - 1.3  1.6 !  Calcium 8.7 - 10.7  9.1  Alkaline Phosphatase 25 - 125  75  Albumin 3.5 - 5.0  3.9  AST 14 - 40  38  ALT 10 - 40  19  Bilirubin, Total  0.3  WBC  3.9  RBC 3.87 - 5.11  2.89 !  Hemoglobin 13.5 - 17.5  9.8 !  HCT 41 - 53  28 !  Platelets 150 - 399  269  NEUT#  2.69    ASSESSMENT & PLAN:  A 60 y.o. 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  Clinically, the patient is  doing much better.  There is nothing per his physical exam to suggest persistent disease.  I will see this patient back in early January 2023 for repeat clinical assessment.  A PET scan will be done a day before his next visit to radiographically ensure that he has had a complete response to his definitive chemoradiation.  The patient understands all the plans discussed today and is in agreement with them.    I, Rita Ohara, am acting as scribe for Marice Potter, MD    I have reviewed this report as typed by the medical scribe, and it is complete and accurate.  Chrys Landgrebe Macarthur Critchley, MD

## 2021-11-05 ENCOUNTER — Other Ambulatory Visit: Payer: Self-pay

## 2021-11-05 ENCOUNTER — Other Ambulatory Visit: Payer: Self-pay | Admitting: Hematology and Oncology

## 2021-11-05 MED ORDER — FLUCONAZOLE 200 MG PO TABS: ORAL_TABLET | ORAL | 0 refills | Status: DC

## 2021-11-06 ENCOUNTER — Encounter: Payer: Self-pay | Admitting: Hematology and Oncology

## 2021-11-06 ENCOUNTER — Encounter: Payer: Self-pay | Admitting: Oncology

## 2021-11-06 ENCOUNTER — Other Ambulatory Visit (HOSPITAL_COMMUNITY): Payer: Self-pay

## 2021-11-11 ENCOUNTER — Other Ambulatory Visit: Payer: Self-pay | Admitting: Oncology

## 2021-11-11 ENCOUNTER — Inpatient Hospital Stay: Payer: Self-pay | Attending: Oncology

## 2021-11-11 ENCOUNTER — Encounter: Payer: Self-pay | Admitting: Oncology

## 2021-11-11 ENCOUNTER — Other Ambulatory Visit: Payer: Self-pay

## 2021-11-11 ENCOUNTER — Inpatient Hospital Stay (HOSPITAL_BASED_OUTPATIENT_CLINIC_OR_DEPARTMENT_OTHER): Payer: Self-pay | Admitting: Oncology

## 2021-11-11 VITALS — BP 113/60 | HR 98 | Temp 98.5°F | Resp 16 | Ht 70.0 in | Wt 172.9 lb

## 2021-11-11 DIAGNOSIS — C01 Malignant neoplasm of base of tongue: Secondary | ICD-10-CM

## 2021-11-11 LAB — COMPREHENSIVE METABOLIC PANEL
Albumin: 3.9 (ref 3.5–5.0)
Calcium: 9.1 (ref 8.7–10.7)

## 2021-11-11 LAB — HEPATIC FUNCTION PANEL
ALT: 19 (ref 10–40)
AST: 38 (ref 14–40)
Alkaline Phosphatase: 75 (ref 25–125)
Bilirubin, Total: 0.3

## 2021-11-11 LAB — BASIC METABOLIC PANEL
BUN: 33 — AB (ref 4–21)
CO2: 25 — AB (ref 13–22)
Chloride: 101 (ref 99–108)
Creatinine: 1.6 — AB (ref 0.6–1.3)
Glucose: 147
Potassium: 3.8 (ref 3.4–5.3)
Sodium: 136 — AB (ref 137–147)

## 2021-11-11 LAB — CBC AND DIFFERENTIAL
HCT: 28 — AB (ref 41–53)
Hemoglobin: 9.8 — AB (ref 13.5–17.5)
Neutrophils Absolute: 2.69
Platelets: 269 (ref 150–399)
WBC: 3.9

## 2021-11-11 LAB — CBC: RBC: 2.89 — AB (ref 3.87–5.11)

## 2021-11-17 ENCOUNTER — Encounter: Payer: Self-pay | Admitting: Oncology

## 2021-11-18 ENCOUNTER — Other Ambulatory Visit (HOSPITAL_COMMUNITY): Payer: Self-pay

## 2021-11-19 ENCOUNTER — Other Ambulatory Visit (HOSPITAL_COMMUNITY): Payer: Self-pay

## 2021-11-19 ENCOUNTER — Encounter: Payer: Self-pay | Admitting: Hematology and Oncology

## 2021-11-19 ENCOUNTER — Encounter: Payer: Self-pay | Admitting: Oncology

## 2021-11-21 ENCOUNTER — Encounter: Payer: Self-pay | Admitting: Oncology

## 2021-11-25 ENCOUNTER — Other Ambulatory Visit (HOSPITAL_COMMUNITY): Payer: Self-pay

## 2021-12-02 ENCOUNTER — Other Ambulatory Visit (HOSPITAL_COMMUNITY): Payer: Self-pay

## 2021-12-03 ENCOUNTER — Encounter: Payer: Self-pay | Admitting: Oncology

## 2021-12-03 ENCOUNTER — Telehealth: Payer: Self-pay

## 2021-12-03 ENCOUNTER — Other Ambulatory Visit (HOSPITAL_COMMUNITY): Payer: Self-pay

## 2021-12-03 ENCOUNTER — Other Ambulatory Visit: Payer: Self-pay | Admitting: Pharmacist

## 2021-12-03 ENCOUNTER — Encounter: Payer: Self-pay | Admitting: Hematology and Oncology

## 2021-12-03 DIAGNOSIS — B20 Human immunodeficiency virus [HIV] disease: Secondary | ICD-10-CM

## 2021-12-03 MED ORDER — INTELENCE 200 MG PO TABS
400.0000 mg | ORAL_TABLET | Freq: Every day | ORAL | 11 refills | Status: DC
  Filled 2021-12-03 – 2021-12-04 (×2): qty 60, 30d supply, fill #0
  Filled 2021-12-24: qty 60, 30d supply, fill #1
  Filled 2022-01-23: qty 60, 30d supply, fill #2
  Filled 2022-02-18: qty 60, 30d supply, fill #3
  Filled 2022-03-13: qty 60, 30d supply, fill #4
  Filled 2022-04-07: qty 60, 30d supply, fill #5
  Filled 2022-05-04: qty 60, 30d supply, fill #6

## 2021-12-03 NOTE — Telephone Encounter (Signed)
[facility] Patient Advocate Encounter  Completed and sent [place] Patient Assistance application for [employer] for this patient who is uninsured.    Patient is approved 12/03/21 through 12/03/22.  BIN      [id number] PCN     GRP    [id number] ID        [id number]  Brand Name Only    Ileene Patrick, [address] Patient [facility] for Infectious Disease Phone: [phone] Fax:  [phone]

## 2021-12-04 ENCOUNTER — Other Ambulatory Visit (HOSPITAL_COMMUNITY): Payer: Self-pay

## 2021-12-05 ENCOUNTER — Encounter: Payer: Self-pay | Admitting: Hematology and Oncology

## 2021-12-05 ENCOUNTER — Other Ambulatory Visit (HOSPITAL_COMMUNITY): Payer: Self-pay

## 2021-12-05 ENCOUNTER — Encounter: Payer: Self-pay | Admitting: Oncology

## 2021-12-09 DIAGNOSIS — C01 Malignant neoplasm of base of tongue: Secondary | ICD-10-CM | POA: Diagnosis not present

## 2021-12-23 ENCOUNTER — Other Ambulatory Visit (HOSPITAL_COMMUNITY): Payer: Self-pay

## 2021-12-24 ENCOUNTER — Other Ambulatory Visit (HOSPITAL_COMMUNITY): Payer: Self-pay

## 2021-12-24 ENCOUNTER — Other Ambulatory Visit: Payer: Self-pay | Admitting: Infectious Disease

## 2021-12-24 DIAGNOSIS — B20 Human immunodeficiency virus [HIV] disease: Secondary | ICD-10-CM

## 2021-12-25 ENCOUNTER — Other Ambulatory Visit: Payer: Self-pay

## 2021-12-25 ENCOUNTER — Other Ambulatory Visit (HOSPITAL_COMMUNITY): Payer: Self-pay

## 2021-12-25 MED ORDER — DESCOVY 200-25 MG PO TABS
1.0000 | ORAL_TABLET | Freq: Every day | ORAL | 1 refills | Status: DC
  Filled 2021-12-25: qty 30, 30d supply, fill #0
  Filled 2022-01-23: qty 30, 30d supply, fill #1

## 2021-12-26 ENCOUNTER — Other Ambulatory Visit (HOSPITAL_COMMUNITY): Payer: Self-pay

## 2021-12-30 ENCOUNTER — Ambulatory Visit: Payer: Self-pay | Admitting: Oncology

## 2021-12-30 ENCOUNTER — Other Ambulatory Visit (HOSPITAL_COMMUNITY): Payer: Self-pay

## 2021-12-31 ENCOUNTER — Telehealth: Payer: Self-pay

## 2021-12-31 ENCOUNTER — Other Ambulatory Visit (HOSPITAL_COMMUNITY): Payer: Self-pay

## 2021-12-31 NOTE — Telephone Encounter (Signed)
[facility] Patient Advocate Encounter °  °Was successful in obtaining a [employer] copay card for Descovy.  This copay card will make the patients copay 0.00. ° °I have spoken with the patient.   ° °The billing information is as follows and has been shared with [facility]. ° ° ° ° ° ° °Beverley Sherrard, CPhT °Specialty Pharmacy Patient Advocate °[facility] °Phone: [phone] °Fax:  [phone]  °

## 2022-01-07 NOTE — Progress Notes (Signed)
[facility]  [address] [place],  [place]  [address] [phone]  Clinic Day:  01/13/2022  Referring physician: Rush Farmer, MD  This document serves as a record of services personally performed by Dequincy Macarthur Critchley, MD. It was created on their behalf by [facility], a trained medical scribe. The creation of this record is based on the scribe's personal observations and the provider's statements to them.  HISTORY OF PRESENT ILLNESS:  The patient is a 61 y.o. 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 He completed all of definitve chemoradiation in October 2022, which consisted of 3 cycles of cisplatin. He comes in today to go over his PET scan images ascertain his new disease baseline after the completion of all of his definitive therapy.  Overall, he claims to be doing better.  He still has some problems with his swallowing solid foods.  He does notice some fullness in his neck, just left of his midline.  Otherwise, he denies having any new symptoms or findings concerning for early disease recurrence.    PHYSICAL EXAM:  Blood pressure 134/75, pulse 91, temperature 98.3 F (36.8 C), temperature source Oral, resp. rate 18, height 5\' 10"  (1.778 m), weight 224 lb 3.2 oz (101.7 kg), SpO2 97 %. Wt Readings from Last 3 Encounters:  07/04/21 224 lb 3.2 oz (101.7 kg)  05/08/19 250 lb (113.4 kg)  08/24/18 232 lb (105.2 kg)   Body mass index is 32.17 kg/m. Performance status (ECOG): 1 - Symptomatic but completely ambulatory  Physical Exam Constitutional:      Appearance: Normal appearance. He is not ill-appearing.  HENT:     Mouth/Throat:     Mouth: Mucous membranes are moist.     Pharynx: Oropharynx is clear. No oropharyngeal exudate or posterior oropharyngeal erythema.     Neck:  A minor area of fluctuance just left to the area corresponding to his thyroid cartilage. Cardiovascular:     Rate and Rhythm: Normal rate and  regular rhythm.     Heart sounds: No murmur heard.   No friction rub. No gallop.  Pulmonary:     Effort: Pulmonary effort is normal. No respiratory distress.     Breath sounds: Normal breath sounds. No wheezing, rhonchi or rales.  Chest:  Breasts:    Right: No axillary adenopathy or supraclavicular adenopathy.     Left: No axillary adenopathy or supraclavicular adenopathy.  Abdominal:     General: Bowel sounds are normal. There is no distension.     Palpations: Abdomen is soft. There is no mass.     Tenderness: no abdominal tenderness  Musculoskeletal:        General: No swelling.     Right lower leg: No edema.     Left lower leg: No edema.  Lymphadenopathy:     Right cervical: No superficial cervical adenopathy.    Left cervical: Superficial cervical adenopathy has resolved.     Upper Body:     Right upper body: No supraclavicular or axillary adenopathy.     Left upper body: No supraclavicular or axillary adenopathy.     Lower Body: No right inguinal adenopathy. No left inguinal adenopathy.  Skin:    General: Skin is warm.     Coloration: Skin is not jaundiced.     Findings: No lesion or rash.  Neurological:     General: No focal deficit present.     Mental Status: He is alert and oriented to  person, place, and time. Mental status is at baseline.     Cranial Nerves: Cranial nerves are intact.  Psychiatric:        Mood and Affect: Mood normal.        Behavior: Behavior normal.        Thought Content: Thought content normal.   SCANS: Recent PET imaging has revealed the following:  FINDINGS:  Mediastinal blood pool activity: SUV max 1.8  Liver activity: SUV max NA   NECK: There is currently no specific CT abnormality or  hypermetabolic activity along the left tongue base at the site of  the prior hypermetabolic malignancy. Maximum SUV on the left at this  level is 3.0, and on the contralateral right side is 3.2 (the mass  formerly had a maximum SUV of 9.1).  The left  level IIa lymph node measures 0.6 cm in short axis on image  73 series 3 (formerly 2.2 cm) with maximum SUV 2.0 (formerly 16.9).  Small amount of physiologic activity at the pharyngo-esophageal  junction, maximum SUV 6.1.  Along the left eccentric isthmus of the thyroid gland, a small  hypermetabolic focus measures maximum SUV 5.3 (formerly 4.3). This  was previously worked up with a thyroid ultrasound on 08/01/2021 but  not well seen on that exam.  Incidental CT findings: Subcutaneous edema along the anterior neck  likely from radiation therapy.   CHEST: No significant abnormal hypermetabolic activity in this  region.  Incidental CT findings: Mild atherosclerotic calcification of the  aortic arch. Mild chronic prominence of venous or lymphatic  structures in the descending periaortic region not changed from 2014  and not hypermetabolic.   ABDOMEN/PELVIS: No significant abnormal hypermetabolic activity in  this region.  Incidental CT findings: Mild prostatomegaly. Aortic Atherosclerosis  (ICD10-I70.0). Scar from prior gastrostomy tube.  SKELETON: No significant abnormal hypermetabolic activity in this  region.  Incidental CT findings: Unusual ossification along the annular  ligament of C1 extending posterior to the odontoid. Even further  posterior to this ossification there is apparent ossification of the  posterior longitudinal ligament behind the odontoid and upper C2  vertebral body, narrowing the spinal canal to about 1.0 cm in this  vicinity. Solid interbody fusion at C4-C5-C6-C7 with anterior plate  and screw fixator.   IMPRESSION:  1. Complete resolution of the prior hypermetabolic mass along the  left tongue base. Prior enlarged and hypermetabolic left level IIa  lymph node is currently of normal size and not hypermetabolic.  2. Persistent small hypermetabolic focus along the left thyroid  isthmus, maximum SUV 5.3 (formerly 4.3). This was previously worked  up with a  thyroid ultrasound in August of 2022 but the nodule not  well seen at that time. A significant minority of hypermetabolic  thyroid nodules can turn out to be thyroid cancer. It might be  prudent to re-evaluate the thyroid gland in about 6 months time in  order to reassess for presence of this left paracentral isthmic  nodule.  3. 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  4. Mild prostatomegaly.  5. Aortic Atherosclerosis (ICD10-I70.0).   LABS:  Latest Reference Range & Units 11/11/21 00:00  Sodium 137 - 147  136 !  Potassium 3.4 - 5.3  3.8  Chloride 99 - 108  101  CO2 13 - 22  25 !  Glucose  147  BUN 4 - 21  33 !  Creatinine 0.6 -  1.3  1.6 !  Calcium 8.7 - 10.7  9.1  Alkaline Phosphatase 25 - 125  75  Albumin 3.5 - 5.0  3.9  AST 14 - 40  38  ALT 10 - 40  19  Bilirubin, Total  0.3  WBC  3.9  RBC 3.87 - 5.11  2.89 !  Hemoglobin 13.5 - 17.5  9.8 !  HCT 41 - 53  28 !  Platelets 150 - 399  269  NEUT#  2.69    ASSESSMENT & PLAN:  A 60 y.o. 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  In clinic today, I went over his PET scan images, for which he could see that he has had complete resolution of his cancer.  Understandably, the patient was pleased with these results.  There is a focus of hypermetabolic activity in the isthmus of his thyroid that remains an ambiguity.  A previous thyroid ultrasound did not reveal an obvious area of concern.  However, I will have him undergo a repeat thyroid ultrasound around the time of his next visit to see if anything in this area has changed to where a biopsy would be warranted.  Clinically, the patient is doing much better.  Moving forward, his disease surveillance will consist of him undergoing physical exams every 4 months.  I will also ensure that he sees ENT periodically for  direct visualization of his oropharynx to ensure there is no evidence of local disease recurrence.  The patient understands all the plans discussed today and is in agreement with them.    I, Rita Ohara, am acting as scribe for Marice Potter, MD    I have reviewed this report as typed by the medical scribe, and it is complete and accurate.  Dequincy Macarthur Critchley, MD

## 2022-01-12 DIAGNOSIS — R221 Localized swelling, mass and lump, neck: Secondary | ICD-10-CM | POA: Diagnosis not present

## 2022-01-12 DIAGNOSIS — M4802 Spinal stenosis, cervical region: Secondary | ICD-10-CM | POA: Diagnosis not present

## 2022-01-12 DIAGNOSIS — C01 Malignant neoplasm of base of tongue: Secondary | ICD-10-CM | POA: Diagnosis not present

## 2022-01-12 DIAGNOSIS — I700 Atherosclerosis of aorta: Secondary | ICD-10-CM | POA: Diagnosis not present

## 2022-01-13 ENCOUNTER — Other Ambulatory Visit: Payer: Self-pay

## 2022-01-13 ENCOUNTER — Other Ambulatory Visit: Payer: Self-pay | Admitting: Oncology

## 2022-01-13 ENCOUNTER — Inpatient Hospital Stay: Payer: BC Managed Care – PPO | Attending: Oncology | Admitting: Oncology

## 2022-01-13 VITALS — BP 123/78 | HR 77 | Temp 98.2°F | Resp 16 | Ht 70.0 in | Wt 174.6 lb

## 2022-01-13 DIAGNOSIS — C01 Malignant neoplasm of base of tongue: Secondary | ICD-10-CM | POA: Diagnosis not present

## 2022-01-13 DIAGNOSIS — Z08 Encounter for follow-up examination after completed treatment for malignant neoplasm: Secondary | ICD-10-CM | POA: Diagnosis not present

## 2022-01-13 DIAGNOSIS — E079 Disorder of thyroid, unspecified: Secondary | ICD-10-CM

## 2022-01-13 NOTE — Progress Notes (Signed)
Patient states that he has a new, enlarging mass under his chin on left side.  Not painful but getting larger.

## 2022-01-15 ENCOUNTER — Other Ambulatory Visit (HOSPITAL_COMMUNITY): Payer: Self-pay

## 2022-01-19 ENCOUNTER — Encounter: Payer: Self-pay | Admitting: Oncology

## 2022-01-23 ENCOUNTER — Other Ambulatory Visit (HOSPITAL_COMMUNITY): Payer: Self-pay

## 2022-01-26 ENCOUNTER — Other Ambulatory Visit (HOSPITAL_COMMUNITY): Payer: Self-pay

## 2022-02-16 ENCOUNTER — Other Ambulatory Visit (HOSPITAL_COMMUNITY): Payer: Self-pay

## 2022-02-17 ENCOUNTER — Other Ambulatory Visit (HOSPITAL_COMMUNITY): Payer: Self-pay

## 2022-02-18 ENCOUNTER — Other Ambulatory Visit (HOSPITAL_COMMUNITY): Payer: Self-pay

## 2022-02-18 ENCOUNTER — Other Ambulatory Visit: Payer: Self-pay | Admitting: Infectious Disease

## 2022-02-18 DIAGNOSIS — B20 Human immunodeficiency virus [HIV] disease: Secondary | ICD-10-CM

## 2022-02-19 ENCOUNTER — Other Ambulatory Visit (HOSPITAL_COMMUNITY): Payer: Self-pay

## 2022-02-19 ENCOUNTER — Other Ambulatory Visit: Payer: Self-pay | Admitting: Infectious Disease

## 2022-02-19 DIAGNOSIS — B20 Human immunodeficiency virus [HIV] disease: Secondary | ICD-10-CM

## 2022-02-19 MED ORDER — DESCOVY 200-25 MG PO TABS
1.0000 | ORAL_TABLET | Freq: Every day | ORAL | 1 refills | Status: DC
  Filled 2022-02-19: qty 30, 30d supply, fill #0
  Filled 2022-03-13: qty 30, 30d supply, fill #1

## 2022-03-13 ENCOUNTER — Other Ambulatory Visit (HOSPITAL_COMMUNITY): Payer: Self-pay

## 2022-03-17 ENCOUNTER — Other Ambulatory Visit (HOSPITAL_COMMUNITY): Payer: Self-pay

## 2022-04-02 ENCOUNTER — Other Ambulatory Visit (HOSPITAL_COMMUNITY): Payer: Self-pay

## 2022-04-07 ENCOUNTER — Other Ambulatory Visit: Payer: Self-pay | Admitting: Infectious Disease

## 2022-04-07 ENCOUNTER — Other Ambulatory Visit (HOSPITAL_COMMUNITY): Payer: Self-pay

## 2022-04-07 DIAGNOSIS — B20 Human immunodeficiency virus [HIV] disease: Secondary | ICD-10-CM

## 2022-04-08 ENCOUNTER — Other Ambulatory Visit (HOSPITAL_COMMUNITY): Payer: Self-pay

## 2022-04-08 MED ORDER — DESCOVY 200-25 MG PO TABS
1.0000 | ORAL_TABLET | Freq: Every day | ORAL | 1 refills | Status: DC
  Filled 2022-04-08: qty 30, 30d supply, fill #0
  Filled 2022-05-04: qty 30, 30d supply, fill #1

## 2022-04-13 ENCOUNTER — Other Ambulatory Visit (HOSPITAL_COMMUNITY): Payer: Self-pay

## 2022-04-14 ENCOUNTER — Other Ambulatory Visit (HOSPITAL_COMMUNITY): Payer: Self-pay

## 2022-04-22 ENCOUNTER — Other Ambulatory Visit: Payer: BC Managed Care – PPO

## 2022-04-28 ENCOUNTER — Ambulatory Visit: Payer: BC Managed Care – PPO | Admitting: Infectious Disease

## 2022-05-04 ENCOUNTER — Other Ambulatory Visit (HOSPITAL_COMMUNITY): Payer: Self-pay

## 2022-05-06 ENCOUNTER — Encounter: Payer: Self-pay | Admitting: Infectious Disease

## 2022-05-06 ENCOUNTER — Ambulatory Visit (INDEPENDENT_AMBULATORY_CARE_PROVIDER_SITE_OTHER): Payer: BC Managed Care – PPO | Admitting: Infectious Disease

## 2022-05-06 ENCOUNTER — Other Ambulatory Visit (HOSPITAL_COMMUNITY): Payer: Self-pay

## 2022-05-06 ENCOUNTER — Other Ambulatory Visit: Payer: Self-pay

## 2022-05-06 VITALS — BP 114/77 | HR 70 | Temp 98.2°F | Wt 198.0 lb

## 2022-05-06 DIAGNOSIS — E1169 Type 2 diabetes mellitus with other specified complication: Secondary | ICD-10-CM | POA: Diagnosis not present

## 2022-05-06 DIAGNOSIS — C01 Malignant neoplasm of base of tongue: Secondary | ICD-10-CM | POA: Diagnosis not present

## 2022-05-06 DIAGNOSIS — B20 Human immunodeficiency virus [HIV] disease: Secondary | ICD-10-CM

## 2022-05-06 DIAGNOSIS — Z113 Encounter for screening for infections with a predominantly sexual mode of transmission: Secondary | ICD-10-CM | POA: Diagnosis not present

## 2022-05-06 DIAGNOSIS — R946 Abnormal results of thyroid function studies: Secondary | ICD-10-CM | POA: Diagnosis not present

## 2022-05-06 HISTORY — DX: Abnormal results of thyroid function studies: R94.6

## 2022-05-06 MED ORDER — DESCOVY 200-25 MG PO TABS
1.0000 | ORAL_TABLET | Freq: Every day | ORAL | 11 refills | Status: DC
  Filled 2022-05-06: qty 30, 30d supply, fill #0
  Filled 2022-06-05: qty 30, 30d supply, fill #1
  Filled 2022-07-01: qty 30, 30d supply, fill #2
  Filled 2022-08-04: qty 30, 30d supply, fill #3
  Filled 2022-09-04: qty 30, 30d supply, fill #4
  Filled 2022-09-30: qty 30, 30d supply, fill #5
  Filled 2022-10-28: qty 30, 30d supply, fill #6
  Filled 2022-12-01 – 2022-12-04 (×3): qty 30, 30d supply, fill #7
  Filled 2022-12-29: qty 30, 30d supply, fill #8
  Filled 2023-01-27 – 2023-02-02 (×2): qty 30, 30d supply, fill #9
  Filled 2023-02-25: qty 30, 30d supply, fill #10
  Filled 2023-03-24: qty 30, 30d supply, fill #11

## 2022-05-06 MED ORDER — DESCOVY 200-25 MG PO TABS
1.0000 | ORAL_TABLET | Freq: Every day | ORAL | 11 refills | Status: DC
  Filled 2022-05-06: qty 30, 30d supply, fill #0

## 2022-05-06 MED ORDER — ATORVASTATIN CALCIUM 10 MG PO TABS
10.0000 mg | ORAL_TABLET | Freq: Every day | ORAL | 11 refills | Status: DC
Start: 2022-05-06 — End: 2023-02-23
  Filled 2022-05-06: qty 30, 30d supply, fill #0
  Filled 2022-12-01 – 2022-12-04 (×3): qty 30, 30d supply, fill #1

## 2022-05-06 MED ORDER — INTELENCE 200 MG PO TABS
400.0000 mg | ORAL_TABLET | Freq: Every day | ORAL | 11 refills | Status: DC
  Filled 2022-05-06: qty 60, 30d supply, fill #0
  Filled 2022-06-05: qty 60, 30d supply, fill #1
  Filled 2022-07-01: qty 60, 30d supply, fill #2
  Filled 2022-08-04: qty 60, 30d supply, fill #3
  Filled 2022-09-04: qty 60, 30d supply, fill #4
  Filled 2022-09-30: qty 60, 30d supply, fill #5
  Filled 2022-10-28: qty 60, 30d supply, fill #6
  Filled 2022-12-01 – 2022-12-04 (×3): qty 60, 30d supply, fill #7
  Filled 2022-12-29: qty 60, 30d supply, fill #8
  Filled 2023-01-27 – 2023-02-02 (×2): qty 60, 30d supply, fill #9
  Filled 2023-02-25: qty 60, 30d supply, fill #10
  Filled 2023-03-24: qty 60, 30d supply, fill #11

## 2022-05-06 NOTE — Progress Notes (Signed)
? ?Subjective:  ?Chief complaint: followup for HIV disease on meds, still with dysphagia ? ? Patient ID: CHARES Ricardo [name], male    DOB: 1961-05-02, 61 y.o.   MRN: [id number] ? ?HPI ? 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 He undergone, radiation and  chemotherapy in October 2022, which consisted of 3 cycles of cisplatin.  ? ?After undergoing chemotherapy and radiation he is lost significant amount of weight and is off all antidiabetic medications antihypertensives. ? ?He is still taking intolerance which is dissolved in liquid intake and with making sure that the drugs are also ingested and crushed DESCOVY. ? ?He still suffers from significant dysphagia and cannot eat solid foods and pills still do get stuck in his throat if he swallows.  Also had several teeth removed in the context of chemo and radiation. ? 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 ? ?Dr. Bobby Rumpf had his PET scan in January which had shown resolution of his cancer.  There was a focus of hypermetabolic activity in the isthmus of the thyroid with the prior ultrasound showing no area of concern. ? ?He is planning on repeat ultrasound when the patient follows up with him. ? ? ? ?Past Medical History:  ?Diagnosis Date  ? Abnormal liver function tests 11/16/2016  ? Diabetes mellitus type 2 in obese ([facility]) 11/20/2015  ? Diabetes mellitus without complication ([place] [facility])   ? Diabetes type 2, uncontrolled ([facility])   ? HIV (human immunodeficiency virus infection) ([facility])   ? Hyperlipidemia   ? LVH (left ventricular hypertrophy)   ? Obesity 05/08/2019  ? Palpitations   ? Rash and nonspecific skin eruption 09/15/2016  ? Thyroid mass 08/14/2021  ? ? ?No past surgical history on file. ? ?Family History  ?Problem Relation Age of Onset  ? Coronary artery disease Father 74  ? ? ?  ?Social History   ? ?Socioeconomic History  ? Marital status: Married  ?  Spouse name: Not on file  ? Number of children: Not on file  ? Years of education: Not on file  ? Highest education level: Not on file  ?Occupational History  ? Not on file  ?Tobacco Use  ? Smoking status: Never  ? Smokeless tobacco: Never  ?Substance and Sexual Activity  ? Alcohol use: No  ? Drug use: No  ? Sexual activity: Yes  ?  Partners: Female  ?  Comment: pt. declined condoms  ?Other Topics Concern  ? Not on file  ?Social History Narrative  ? Not on file  ? ?Social Determinants of Health  ? ?Financial Resource Strain: Not on file  ?Food Insecurity: Not on file  ?Transportation Needs: Not on file  ?Physical Activity: Not on file  ?Stress: Not on file  ?Social Connections: Not on file  ? ? ?Allergies  ?Allergen Reactions  ? Amoxicillin-Pot Clavulanate   ? Amoxicillin-Pot Clavulanate   ? ? ? ?Current Outpatient Medications:  ?  amitriptyline (ELAVIL) 25 MG tablet, Take 1 tablet by mouth at bedtime., Disp: , Rfl:  ?  Blood Glucose Monitoring Suppl (ONE TOUCH ULTRA 2) W/DEVICE KIT, Use to check blood sugars 1/day., Disp: 1 each, Rfl: 0 ?  diclofenac (VOLTAREN) 75 MG EC tablet, Take 75 mg by mouth 2 (two) times daily., Disp: , Rfl:  ?  Dulaglutide (TRULICITY) 4.5 PP/5.0DT SOPN, Inject 1  pen under the skin once weekly as directed, Disp: 2 mL, Rfl: 3 ?  emtricitabine-tenofovir AF (DESCOVY) 200-25 MG tablet, TAKE 1 TABLET BY MOUTH DAILY., Disp: 30 tablet, Rfl: 1 ?  FARXIGA 10 MG TABS tablet, Take 10 mg by mouth daily., Disp: , Rfl:  ?  fenofibrate 160 MG tablet, Take 1 tablet by mouth daily., Disp: , Rfl:  ?  fluconazole (DIFLUCAN) 200 MG tablet, Take 1 tablet by mouth once daily, Disp: 5 tablet, Rfl: 0 ?  glipiZIDE (GLUCOTROL XL) 5 MG 24 hr tablet, Take 2 tablets by mouth daily, Disp: 180 tablet, Rfl: 0 ?  glucose blood (ONE TOUCH ULTRA TEST) test strip, Use to check blood sugars three times daily., Disp: 100 each, Rfl: 11 ?  INTELENCE 200 MG TABS, Take 2  tablets (400 mg total) by mouth daily., Disp: 60 tablet, Rfl: 11 ?  lidocaine (XYLOCAINE) 2 % solution, Use as directed 15 mLs in the mouth or throat every 4 (four) hours as needed for mouth pain. Swallow 22m qid prn mouth pain, Disp: , Rfl:  ?  lidocaine (XYLOCAINE) 2 % solution, Take 15 MLs (1 tablespoonfull) by mouth 4 times a day (swallow), Disp: 300 mL, Rfl: 3 ?  lisinopril (ZESTRIL) 2.5 MG tablet, Take 1 (one) Tablet daily, for kidney protection (DM), Disp: 90 tablet, Rfl: 1 ?  magic mouthwash (nystatin, lidocaine, diphenhydrAMINE, alum & mag hydroxide) suspension, Swish and swallow 5 mls by mouth 4 times daily as needed for pain, Disp: 240 mL, Rfl: 2 ?  MAGnesium-Oxide 400 (240 Mg) MG tablet, Crush 1 tablet and administer via PEG twice daily as directed, Disp: 60 tablet, Rfl: 0 ?  metoprolol succinate (TOPROL-XL) 100 MG 24 hr tablet, TAKE 1 TABLET BY MOUTH ONCE A DAY WITH OR IMMEDIATELY FOLLOWING A MEAL (NEEDS APPT), Disp: 30 tablet, Rfl: 4 ?  Nutritional Supplements (TWOCAL HN) LIQD, Insert 240 ML via PEG tube at 9am, 1pm, 5pm and 9pm as directed, Disp: 33.8 mL, Rfl: 0 ?  nystatin (MYCOSTATIN) 100000 UNIT/ML suspension, Take 5 mLs by mouth 4 (four) times daily., Disp: , Rfl:  ?  omeprazole (FIRST-OMEPRAZOLE) 2 mg/mL SUSP oral suspension, Place 10 mLs (20 mg total) into feeding tube daily., Disp: 300 mL, Rfl: 0 ?  ondansetron (ZOFRAN-ODT) 4 MG disintegrating tablet, Dissolve 1 tablet in mouth every 4 hours as needed for nausea or vomiting., Disp: 90 tablet, Rfl: 0 ?  oxyCODONE-acetaminophen (ROXICET) 5-325 MG/5ML solution, Take 5 mLs by mouth every 4 (four) hours as needed for severe pain., Disp: 140 mL, Rfl: 0 ?  pantoprazole sodium (PROTONIX) 40 mg, Place 40 mg into feeding tube daily., Disp: 30 packet, Rfl: 3 ?  potassium chloride 20 MEQ/15ML (10%) SOLN, Take 30 ml via PEG daily at 12 noon as dircted, Disp: 900 mL, Rfl: 0 ?  prochlorperazine (COMPAZINE) 10 MG tablet, Take 1 tablet (10 mg total) by mouth  every 6 (six) hours as needed for nausea or vomiting., Disp: 90 tablet, Rfl: 3 ?  promethazine (PHENERGAN) 6.25 MG/5ML syrup, Take 5 mls by mouth every 4 to 6 hours as needed for nausea/vomiting, Disp: 200 mL, Rfl: 1 ?  rosuvastatin (CRESTOR) 20 MG tablet, Take 1 tablet by mouth daily., Disp: , Rfl:  ?  sulfamethoxazole-trimethoprim (BACTRIM) 200-40 MG/5ML suspension, Place 20 mLs into feeding tube daily., Disp: 100 mL, Rfl: 6 ? ? ?Review of Systems  ?Constitutional:  Negative for activity change, appetite change, chills, diaphoresis, fatigue, fever and unexpected weight change.  ?HENT:  Negative for congestion, rhinorrhea, sinus pressure, sneezing, sore throat and trouble swallowing.   ?Eyes:  Negative for photophobia and visual disturbance.  ?Respiratory:  Negative for cough, chest tightness, shortness of breath, wheezing and stridor.   ?Cardiovascular:  Negative for chest pain, palpitations and leg swelling.  ?Gastrointestinal:  Negative for abdominal distention, abdominal pain, anal bleeding, blood in stool, constipation, diarrhea, nausea and vomiting.  ?Genitourinary:  Negative for difficulty urinating, dysuria, flank pain and hematuria.  ?Musculoskeletal:  Negative for arthralgias, back pain, gait problem, joint swelling and myalgias.  ?Skin:  Negative for color change, pallor, rash and wound.  ?Neurological:  Negative for dizziness, tremors, weakness and light-headedness.  ?Hematological:  Negative for adenopathy. Does not bruise/bleed easily.  ?Psychiatric/Behavioral:  Negative for agitation, behavioral problems, confusion, decreased concentration, dysphoric mood and sleep disturbance.   ? ?   ?Objective:  ? Physical Exam ?Constitutional:   ?   Appearance: He is well-developed.  ?HENT:  ?   Head: Normocephalic and atraumatic.  ?Eyes:  ?   Conjunctiva/sclera: Conjunctivae normal.  ?Cardiovascular:  ?   Rate and Rhythm: Normal rate and regular rhythm.  ?Pulmonary:  ?   Effort: Pulmonary effort is normal. No  respiratory distress.  ?   Breath sounds: No wheezing.  ?Abdominal:  ?   General: There is no distension.  ?   Palpations: Abdomen is soft.  ?Musculoskeletal:     ?   General: No tenderness. Normal range of motion.

## 2022-05-06 NOTE — Patient Instructions (Signed)
I will ask pharmacy to investigate which statin's we could crush to give you ?

## 2022-05-08 ENCOUNTER — Other Ambulatory Visit (HOSPITAL_COMMUNITY): Payer: Self-pay

## 2022-05-08 LAB — T-HELPER CELLS (CD4) COUNT (NOT AT ARMC)
CD4 % Helper T Cell: 29 % — ABNORMAL LOW (ref 33–65)
CD4 T Cell Abs: 297 /uL — ABNORMAL LOW (ref 400–1790)

## 2022-05-11 ENCOUNTER — Other Ambulatory Visit (HOSPITAL_COMMUNITY): Payer: Self-pay

## 2022-05-12 NOTE — Progress Notes (Signed)
[facility]  [address] [place],  [place]  [address] [phone]  Clinic Day:  05/13/2022  Referring physician: Rush Farmer, MD  HISTORY OF PRESENT ILLNESS:  The patient is a 61 y.o. 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 He completed all of definitve chemoradiation in October 2022, which consisted of 3 cycles of cisplatin.  A PET scan done afterwards showed complete response to his chemoradiation.  He comes in today for routine follow-up.  Since his last visit, patient has been doing okay.  The major issue which continues to impact his daily quality of life is suboptimal swallowing.  This has been the case ever since his radiation was completed.  He also claims that his voice is also mildly altered.  However, he denies having any problems to his concern for early disease recurrence.  PHYSICAL EXAM:  Blood pressure 134/75, pulse 91, temperature 98.3 F (36.8 C), temperature source Oral, resp. rate 18, height '5\' 10"'$  (1.778 m), weight 224 lb 3.2 oz (101.7 kg), SpO2 97 %. Wt Readings from Last 3 Encounters:  07/04/21 224 lb 3.2 oz (101.7 kg)  05/08/19 250 lb (113.4 kg)  08/24/18 232 lb (105.2 kg)   Body mass index is 32.17 kg/m. Performance status (ECOG): 1 - Symptomatic but completely ambulatory  Physical Exam Constitutional:      Appearance: Normal appearance. He is not ill-appearing.  HENT:     Mouth/Throat:     Mouth: Mucous membranes are moist.     Pharynx: Oropharynx is clear. No oropharyngeal exudate or posterior oropharyngeal erythema.     Neck: No palpable masses appreciated. Cardiovascular:     Rate and Rhythm: Normal rate and regular rhythm.     Heart sounds: No murmur heard.   No friction rub. No gallop.  Pulmonary:     Effort: Pulmonary effort is normal. No respiratory distress.     Breath sounds: Normal breath sounds. No wheezing, rhonchi or rales.  Chest:  Breasts:    Right: No axillary  adenopathy or supraclavicular adenopathy.     Left: No axillary adenopathy or supraclavicular adenopathy.  Abdominal:     General: Bowel sounds are normal. There is no distension.     Palpations: Abdomen is soft. There is no mass.     Tenderness: no abdominal tenderness  Musculoskeletal:        General: No swelling.     Right lower leg: No edema.     Left lower leg: No edema.  Lymphadenopathy:     Right cervical: No superficial cervical adenopathy.    Left cervical: Superficial cervical adenopathy has resolved.     Upper Body:     Right upper body: No supraclavicular or axillary adenopathy.     Left upper body: No supraclavicular or axillary adenopathy.     Lower Body: No right inguinal adenopathy. No left inguinal adenopathy.  Skin:    General: Skin is warm.     Coloration: Skin is not jaundiced.     Findings: Thickened skin changes over his left neck from recent radiation. Neurological:     General: No focal deficit present.     Mental Status: He is alert and oriented to person, place, and time. Mental status is at baseline.     Cranial Nerves: Cranial nerves are intact.  Psychiatric:        Mood and Affect: Mood normal.        Behavior: Behavior normal.  Thought Content: Thought content normal.   LABS:  ASSESSMENT & PLAN:  A 61 y.o. 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  Based upon his exam today, the patient remains disease-free.  Clinically, he appears to be doing fairly well.  With respect to his swallowing problems from previous radiation, I will have him see speech pathology so they may better assess his swallowing mechanics.  They will also work with him to see if his speech can improve even more than what it has been lately.  Of note, this gentleman's PET scan did show a hypermetabolic focus of disease in the isthmus.  However, a previous thyroid ultrasound did not show  any particular thyroid abnormality.  I will have his thyroid ultrasound repeated to see if any changes have developed over time.  I will notify him of the results as soon as they become available.  Otherwise, I will see this gentleman back in 4 months for repeat assessment.  The patient understands all the plans discussed today and is in agreement with them.     Kayler Buckholtz Macarthur Critchley, MD

## 2022-05-13 ENCOUNTER — Inpatient Hospital Stay: Payer: BC Managed Care – PPO

## 2022-05-13 ENCOUNTER — Other Ambulatory Visit: Payer: Self-pay | Admitting: Oncology

## 2022-05-13 ENCOUNTER — Other Ambulatory Visit: Payer: Self-pay | Admitting: Hematology and Oncology

## 2022-05-13 ENCOUNTER — Encounter: Payer: Self-pay | Admitting: Infectious Disease

## 2022-05-13 ENCOUNTER — Inpatient Hospital Stay: Payer: BC Managed Care – PPO | Attending: Oncology | Admitting: Oncology

## 2022-05-13 ENCOUNTER — Telehealth: Payer: Self-pay | Admitting: Oncology

## 2022-05-13 VITALS — BP 119/74 | HR 67 | Temp 97.8°F | Resp 16 | Ht 70.0 in | Wt 194.2 lb

## 2022-05-13 DIAGNOSIS — Z85810 Personal history of malignant neoplasm of tongue: Secondary | ICD-10-CM | POA: Diagnosis not present

## 2022-05-13 DIAGNOSIS — C01 Malignant neoplasm of base of tongue: Secondary | ICD-10-CM | POA: Diagnosis not present

## 2022-05-13 DIAGNOSIS — Z9221 Personal history of antineoplastic chemotherapy: Secondary | ICD-10-CM | POA: Diagnosis not present

## 2022-05-13 DIAGNOSIS — E079 Disorder of thyroid, unspecified: Secondary | ICD-10-CM

## 2022-05-13 DIAGNOSIS — E049 Nontoxic goiter, unspecified: Secondary | ICD-10-CM | POA: Diagnosis not present

## 2022-05-13 DIAGNOSIS — Z923 Personal history of irradiation: Secondary | ICD-10-CM | POA: Insufficient documentation

## 2022-05-13 LAB — HEPATIC FUNCTION PANEL
ALT: 23 U/L (ref 10–40)
AST: 34 (ref 14–40)
Alkaline Phosphatase: 55 (ref 25–125)
Bilirubin, Total: 0.4

## 2022-05-13 LAB — CBC AND DIFFERENTIAL
HCT: 35 — AB (ref 41–53)
Hemoglobin: 11.7 — AB (ref 13.5–17.5)
Neutrophils Absolute: 2.28
Platelets: 219 10*3/uL (ref 150–400)
WBC: 4

## 2022-05-13 LAB — COMPREHENSIVE METABOLIC PANEL
Albumin: 4.3 (ref 3.5–5.0)
Calcium: 9.1 (ref 8.7–10.7)

## 2022-05-13 LAB — BASIC METABOLIC PANEL
BUN: 24 — AB (ref 4–21)
CO2: 28 — AB (ref 13–22)
Chloride: 104 (ref 99–108)
Creatinine: 1 (ref 0.6–1.3)
Glucose: 118
Potassium: 4.1 mEq/L (ref 3.5–5.1)
Sodium: 141 (ref 137–147)

## 2022-05-13 LAB — TSH: TSH: 3.184 u[IU]/mL (ref 0.350–4.500)

## 2022-05-13 LAB — CBC
MCV: 94 (ref 80–94)
RBC: 3.76 — AB (ref 3.87–5.11)

## 2022-05-13 NOTE — Telephone Encounter (Signed)
Per 05/13/22 los next appt scheduled and confirmed with patient 

## 2022-05-14 ENCOUNTER — Encounter: Payer: Self-pay | Admitting: Oncology

## 2022-05-14 ENCOUNTER — Other Ambulatory Visit (HOSPITAL_COMMUNITY): Payer: Self-pay

## 2022-06-01 LAB — CBC WITH DIFFERENTIAL/PLATELET
Absolute Monocytes: 366 cells/uL (ref 200–950)
Basophils Absolute: 30 cells/uL (ref 0–200)
Basophils Relative: 0.8 %
Eosinophils Absolute: 178 cells/uL (ref 15–500)
Eosinophils Relative: 4.8 %
HCT: 35.8 % — ABNORMAL LOW (ref 38.5–50.0)
Hemoglobin: 12 g/dL — ABNORMAL LOW (ref 13.2–17.1)
Lymphs Abs: 1080 cells/uL (ref 850–3900)
MCH: 32 pg (ref 27.0–33.0)
MCHC: 33.5 g/dL (ref 32.0–36.0)
MCV: 95.5 fL (ref 80.0–100.0)
MPV: 9.8 fL (ref 7.5–12.5)
Monocytes Relative: 9.9 %
Neutro Abs: 2046 cells/uL (ref 1500–7800)
Neutrophils Relative %: 55.3 %
Platelets: 233 10*3/uL (ref 140–400)
RBC: 3.75 10*6/uL — ABNORMAL LOW (ref 4.20–5.80)
RDW: 13.1 % (ref 11.0–15.0)
Total Lymphocyte: 29.2 %
WBC: 3.7 10*3/uL — ABNORMAL LOW (ref 3.8–10.8)

## 2022-06-01 LAB — LIPID PANEL
Cholesterol: 234 mg/dL — ABNORMAL HIGH (ref <200)
HDL: 37 mg/dL — ABNORMAL LOW (ref >=40)
LDL Cholesterol (Calc): 149 mg/dL (calc) — ABNORMAL HIGH
Non-HDL Cholesterol (Calc): 197 mg/dL (calc) — ABNORMAL HIGH (ref <130)
Total CHOL/HDL Ratio: 6.3 (calc) — ABNORMAL HIGH (ref <5.0)
Triglycerides: 292 mg/dL — ABNORMAL HIGH (ref <150)

## 2022-06-01 LAB — COMPLETE METABOLIC PANEL WITH GFR
AG Ratio: 1.3 (calc) (ref 1.0–2.5)
ALT: 13 U/L (ref 9–46)
AST: 22 U/L (ref 10–35)
Albumin: 4 g/dL (ref 3.6–5.1)
Alkaline phosphatase (APISO): 51 U/L (ref 35–144)
BUN/Creatinine Ratio: 25 (calc) — ABNORMAL HIGH (ref 6–22)
BUN: 28 mg/dL — ABNORMAL HIGH (ref 7–25)
CO2: 28 mmol/L (ref 20–32)
Calcium: 9.2 mg/dL (ref 8.6–10.3)
Chloride: 102 mmol/L (ref 98–110)
Creat: 1.13 mg/dL (ref 0.70–1.35)
Globulin: 3.1 g/dL (calc) (ref 1.9–3.7)
Glucose, Bld: 140 mg/dL — ABNORMAL HIGH (ref 65–99)
Potassium: 4.1 mmol/L (ref 3.5–5.3)
Sodium: 140 mmol/L (ref 135–146)
Total Bilirubin: 0.3 mg/dL (ref 0.2–1.2)
Total Protein: 7.1 g/dL (ref 6.1–8.1)
eGFR: 74 mL/min/{1.73_m2} (ref >=60)

## 2022-06-01 LAB — RPR: RPR Ser Ql: NONREACTIVE

## 2022-06-01 LAB — HEMOGLOBIN A1C
Hgb A1c MFr Bld: 5.9 % of total Hgb — ABNORMAL HIGH (ref <5.7)
Mean Plasma Glucose: 123 mg/dL
eAG (mmol/L): 6.8 mmol/L

## 2022-06-01 LAB — HIV-1 RNA QUANT-NO REFLEX-BLD

## 2022-06-02 ENCOUNTER — Other Ambulatory Visit (HOSPITAL_COMMUNITY): Payer: Self-pay

## 2022-06-02 ENCOUNTER — Other Ambulatory Visit: Payer: Self-pay

## 2022-06-02 DIAGNOSIS — B20 Human immunodeficiency virus [HIV] disease: Secondary | ICD-10-CM

## 2022-06-04 ENCOUNTER — Other Ambulatory Visit (HOSPITAL_COMMUNITY): Payer: Self-pay

## 2022-06-05 ENCOUNTER — Other Ambulatory Visit (HOSPITAL_COMMUNITY): Payer: Self-pay

## 2022-06-08 ENCOUNTER — Other Ambulatory Visit (HOSPITAL_COMMUNITY): Payer: Self-pay

## 2022-06-09 ENCOUNTER — Ambulatory Visit: Payer: BC Managed Care – PPO

## 2022-06-10 ENCOUNTER — Other Ambulatory Visit (HOSPITAL_COMMUNITY): Payer: Self-pay

## 2022-06-13 ENCOUNTER — Other Ambulatory Visit (HOSPITAL_COMMUNITY): Payer: Self-pay

## 2022-06-15 ENCOUNTER — Other Ambulatory Visit (HOSPITAL_COMMUNITY): Payer: Self-pay

## 2022-06-16 ENCOUNTER — Ambulatory Visit: Payer: BC Managed Care – PPO

## 2022-07-01 ENCOUNTER — Other Ambulatory Visit (HOSPITAL_COMMUNITY): Payer: Self-pay

## 2022-07-13 ENCOUNTER — Other Ambulatory Visit (HOSPITAL_COMMUNITY): Payer: Self-pay

## 2022-08-04 ENCOUNTER — Other Ambulatory Visit (HOSPITAL_COMMUNITY): Payer: Self-pay

## 2022-08-11 ENCOUNTER — Other Ambulatory Visit (HOSPITAL_COMMUNITY): Payer: Self-pay

## 2022-08-12 ENCOUNTER — Other Ambulatory Visit (HOSPITAL_COMMUNITY): Payer: Self-pay

## 2022-08-13 ENCOUNTER — Other Ambulatory Visit (HOSPITAL_COMMUNITY): Payer: Self-pay

## 2022-08-20 ENCOUNTER — Other Ambulatory Visit (HOSPITAL_COMMUNITY): Payer: Self-pay

## 2022-09-02 ENCOUNTER — Other Ambulatory Visit (HOSPITAL_COMMUNITY): Payer: Self-pay

## 2022-09-04 ENCOUNTER — Other Ambulatory Visit (HOSPITAL_COMMUNITY): Payer: Self-pay

## 2022-09-08 ENCOUNTER — Other Ambulatory Visit (HOSPITAL_COMMUNITY): Payer: Self-pay

## 2022-09-11 ENCOUNTER — Other Ambulatory Visit (HOSPITAL_COMMUNITY): Payer: Self-pay

## 2022-09-13 NOTE — Progress Notes (Signed)
Ricardo [name]  [address] [place],  [place]  [address] [phone]  Clinic Day:  09/14/2022  Referring physician: Rush Farmer, MD  HISTORY OF PRESENT ILLNESS:  The patient is a 61 y.o. 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 He completed all of definitve chemoradiation in October 2022, which consisted of 3 cycles of cisplatin.  A PET scan done afterwards showed a complete response to his chemoradiation.  He comes in today for routine follow-up.  Since his last visit, patient has been doing okay.  The major issue which continues to impact his daily quality of life is suboptimal swallowing.  This has been the case ever since his radiation was completed.  However, he denies having any problems which concern him for early disease recurrence.  PHYSICAL EXAM:  Blood pressure 134/75, pulse 91, temperature 98.3 F (36.8 C), temperature source Oral, resp. rate 18, height '5\' 10"'$  (1.778 m), weight 224 lb 3.2 oz (101.7 kg), SpO2 97 %. Wt Readings from Last 3 Encounters:  07/04/21 224 lb 3.2 oz (101.7 kg)  05/08/19 250 lb (113.4 kg)  08/24/18 232 lb (105.2 kg)   Body mass index is 32.17 kg/m. Performance status (ECOG): 1 - Symptomatic but completely ambulatory  Physical Exam Constitutional:      Appearance: Normal appearance. He is not ill-appearing.  HENT:     Mouth/Throat:     Mouth: Mucous membranes are moist.     Pharynx: Oropharynx is clear. No oropharyngeal exudate or posterior oropharyngeal erythema.     Neck: No palpable masses appreciated. Cardiovascular:     Rate and Rhythm: Normal rate and regular rhythm.     Heart sounds: No murmur heard.   No friction rub. No gallop.  Pulmonary:     Effort: Pulmonary effort is normal. No respiratory distress.     Breath sounds: Normal breath sounds. No wheezing, rhonchi or rales.  Chest:  Breasts:    Right: No axillary adenopathy or supraclavicular adenopathy.     Left: No  axillary adenopathy or supraclavicular adenopathy.  Abdominal:     General: Bowel sounds are normal. There is no distension.     Palpations: Abdomen is soft. There is no mass.     Tenderness: no abdominal tenderness  Musculoskeletal:        General: No swelling.     Right lower leg: No edema.     Left lower leg: No edema.  Lymphadenopathy:     Right cervical: No superficial cervical adenopathy.    Left cervical: Superficial cervical adenopathy has resolved.     Upper Body:     Right upper body: No supraclavicular or axillary adenopathy.     Left upper body: No supraclavicular or axillary adenopathy.     Lower Body: No right inguinal adenopathy. No left inguinal adenopathy.  Skin:    General: Skin is warm.     Coloration: Skin is not jaundiced.     Findings: Thickened skin changes over his left neck from recent radiation. Neurological:     General: No focal deficit present.     Mental Status: He is alert and oriented to person, place, and time. Mental status is at baseline.     Cranial Nerves: Cranial nerves are intact.  Psychiatric:        Mood and Affect: Mood normal.        Behavior: Behavior normal.        Thought Content: Thought content normal.  LABS:  Latest Reference Range & Units 09/14/22 00:00  Sodium 137 - 147  137 (E)  Potassium 3.5 - 5.1 mEq/L 4.0 (E)  Chloride 99 - 108  105 (E)  CO2 13 - 22  30 ! (E)  Glucose  128 (E)  BUN 4 - 21  22 ! (E)  Creatinine 0.6 - 1.3  1.1 (E)  Calcium 8.7 - 10.7  9.3 (E)  Alkaline Phosphatase 25 - 125  61 (E)  Albumin 3.5 - 5.0  4.3 (E)  AST 14 - 40  29 (E)  ALT 10 - 40 U/L 16 (E)  Bilirubin, Total  0.5 (E)  WBC  3.1 (E)  RBC 3.87 - 5.11  4.2 (E)  Hemoglobin 13.5 - 17.5  13.2 ! (E)  HCT 41 - 53  38 ! (E)  Platelets 150 - 400 K/uL 200 (E)  NEUT#  1.55 (E)  !: Data is abnormal (E): External lab result  ASSESSMENT & PLAN:  A 61 y.o. 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  Based upon his exam today, the patient remains disease-free.  Clinically, he appears to be doing fairly well.  With respect to his swallowing problems from previous radiation, I will have him see gastroenterology in early November 2023 and see if some type of esophageal dilation can be done to address his dysphagia/odynophagia.  Otherwise, I will see him back in 4 months for repeat clinical assessment. The patient understands all the plans discussed today and is in agreement with them.    Jullie Arps Macarthur Critchley, MD

## 2022-09-14 ENCOUNTER — Inpatient Hospital Stay: Payer: BC Managed Care – PPO | Attending: Oncology | Admitting: Oncology

## 2022-09-14 ENCOUNTER — Inpatient Hospital Stay: Payer: BC Managed Care – PPO

## 2022-09-14 ENCOUNTER — Other Ambulatory Visit (HOSPITAL_COMMUNITY): Payer: Self-pay

## 2022-09-14 VITALS — BP 133/84 | HR 78 | Temp 97.8°F | Resp 16 | Ht 70.0 in | Wt 203.9 lb

## 2022-09-14 DIAGNOSIS — Z85810 Personal history of malignant neoplasm of tongue: Secondary | ICD-10-CM | POA: Diagnosis not present

## 2022-09-14 DIAGNOSIS — C01 Malignant neoplasm of base of tongue: Secondary | ICD-10-CM | POA: Diagnosis not present

## 2022-09-14 DIAGNOSIS — Z923 Personal history of irradiation: Secondary | ICD-10-CM | POA: Insufficient documentation

## 2022-09-14 DIAGNOSIS — Z9221 Personal history of antineoplastic chemotherapy: Secondary | ICD-10-CM | POA: Diagnosis not present

## 2022-09-14 LAB — CBC AND DIFFERENTIAL
HCT: 38 — AB (ref 41–53)
Hemoglobin: 13.2 — AB (ref 13.5–17.5)
Neutrophils Absolute: 1.55
Platelets: 200 10*3/uL (ref 150–400)
WBC: 3.1

## 2022-09-14 LAB — HEPATIC FUNCTION PANEL
ALT: 16 U/L (ref 10–40)
AST: 29 (ref 14–40)
Alkaline Phosphatase: 61 (ref 25–125)
Bilirubin, Total: 0.5

## 2022-09-14 LAB — BASIC METABOLIC PANEL
BUN: 22 — AB (ref 4–21)
CO2: 30 — AB (ref 13–22)
Chloride: 105 (ref 99–108)
Creatinine: 1.1 (ref 0.6–1.3)
Glucose: 128
Potassium: 4 mEq/L (ref 3.5–5.1)
Sodium: 137 (ref 137–147)

## 2022-09-14 LAB — COMPREHENSIVE METABOLIC PANEL
Albumin: 4.3 (ref 3.5–5.0)
Calcium: 9.3 (ref 8.7–10.7)

## 2022-09-14 LAB — CBC: RBC: 4.2 (ref 3.87–5.11)

## 2022-09-14 LAB — TSH: TSH: 3.119 u[IU]/mL (ref 0.350–4.500)

## 2022-09-15 ENCOUNTER — Ambulatory Visit (INDEPENDENT_AMBULATORY_CARE_PROVIDER_SITE_OTHER): Payer: BC Managed Care – PPO

## 2022-09-15 ENCOUNTER — Other Ambulatory Visit: Payer: Self-pay

## 2022-09-15 ENCOUNTER — Other Ambulatory Visit: Payer: BC Managed Care – PPO

## 2022-09-15 DIAGNOSIS — B20 Human immunodeficiency virus [HIV] disease: Secondary | ICD-10-CM

## 2022-09-15 DIAGNOSIS — Z23 Encounter for immunization: Secondary | ICD-10-CM

## 2022-09-17 ENCOUNTER — Other Ambulatory Visit (HOSPITAL_COMMUNITY): Payer: Self-pay

## 2022-09-17 LAB — HIV-1 RNA QUANT-NO REFLEX-BLD
HIV 1 RNA Quant: NOT DETECTED Copies/mL
HIV-1 RNA Quant, Log: NOT DETECTED Log cps/mL

## 2022-09-17 MED ORDER — OMEPRAZOLE 40 MG PO CPDR
40.0000 mg | DELAYED_RELEASE_CAPSULE | Freq: Every morning | ORAL | 3 refills | Status: DC
  Filled 2022-09-17 (×2): qty 30, 30d supply, fill #0
  Filled 2022-11-13: qty 30, 30d supply, fill #1
  Filled 2022-12-01 – 2022-12-04 (×3): qty 30, 30d supply, fill #2
  Filled 2022-12-29: qty 30, 30d supply, fill #3

## 2022-09-18 DIAGNOSIS — R1310 Dysphagia, unspecified: Secondary | ICD-10-CM | POA: Diagnosis not present

## 2022-09-21 ENCOUNTER — Encounter: Payer: Self-pay | Admitting: Infectious Disease

## 2022-09-28 ENCOUNTER — Other Ambulatory Visit (HOSPITAL_COMMUNITY): Payer: Self-pay

## 2022-09-30 ENCOUNTER — Other Ambulatory Visit (HOSPITAL_COMMUNITY): Payer: Self-pay

## 2022-10-08 ENCOUNTER — Encounter: Payer: Self-pay | Admitting: Oncology

## 2022-10-12 ENCOUNTER — Telehealth: Payer: Self-pay

## 2022-10-12 ENCOUNTER — Other Ambulatory Visit (HOSPITAL_COMMUNITY): Payer: Self-pay

## 2022-10-12 NOTE — Telephone Encounter (Signed)
[facility] Patient Advocate Encounter   Received notification from [facility] [place] that prior authorization for Descovy is required.   PA submitted on 10/12/22 Key [id number] Status is pending    [facility] will continue to follow.   Ileene Patrick, [facility] Patient [facility] Phone: [phone] Fax:  [phone]

## 2022-10-13 ENCOUNTER — Telehealth: Payer: Self-pay

## 2022-10-13 ENCOUNTER — Other Ambulatory Visit (HOSPITAL_COMMUNITY): Payer: Self-pay

## 2022-10-13 NOTE — Telephone Encounter (Addendum)
[facility] Patient Advocate Encounter  Prior Authorization for Descovy has been approved.     PA # [id number] Effective dates: 10/12/22 through 10/14/23  Patients co-pay is $60.00.   [facility] will continue to follow.  Ileene Patrick, [facility] Patient [facility] for [facility] Phone: [phone] Fax:  [phone]

## 2022-10-27 ENCOUNTER — Telehealth: Payer: Self-pay | Admitting: *Deleted

## 2022-10-27 NOTE — Patient Outreach (Signed)
  Care Coordination   10/27/2022 Name: Ricardo [name] MRN: [id number] DOB: 08/19/1961   Care Coordination Outreach Attempts:  An unsuccessful telephone outreach was attempted today to offer the patient information about available care coordination services as a benefit of their health plan.   Follow Up Plan:  Additional outreach attempts will be made to offer the patient care coordination information and services.   Encounter Outcome:  Pt. Visit Completed  Care Coordination Interventions Activated:  No   Care Coordination Interventions:  No, not indicated    Eduard Clos MSW, LCSW Licensed Clinical Socia l Worker      [phone]

## 2022-10-28 ENCOUNTER — Other Ambulatory Visit (HOSPITAL_COMMUNITY): Payer: Self-pay

## 2022-11-09 ENCOUNTER — Other Ambulatory Visit (HOSPITAL_COMMUNITY): Payer: Self-pay

## 2022-11-10 ENCOUNTER — Other Ambulatory Visit (HOSPITAL_COMMUNITY): Payer: Self-pay

## 2022-11-13 ENCOUNTER — Other Ambulatory Visit (HOSPITAL_COMMUNITY): Payer: Self-pay

## 2022-11-16 ENCOUNTER — Other Ambulatory Visit (HOSPITAL_COMMUNITY): Payer: Self-pay

## 2022-11-17 ENCOUNTER — Telehealth: Payer: Self-pay | Admitting: *Deleted

## 2022-11-17 ENCOUNTER — Encounter: Payer: Self-pay | Admitting: Oncology

## 2022-11-17 DIAGNOSIS — E119 Type 2 diabetes mellitus without complications: Secondary | ICD-10-CM | POA: Diagnosis not present

## 2022-11-17 DIAGNOSIS — K2289 Other specified disease of esophagus: Secondary | ICD-10-CM | POA: Diagnosis not present

## 2022-11-17 DIAGNOSIS — Z923 Personal history of irradiation: Secondary | ICD-10-CM | POA: Diagnosis not present

## 2022-11-17 DIAGNOSIS — K219 Gastro-esophageal reflux disease without esophagitis: Secondary | ICD-10-CM | POA: Diagnosis not present

## 2022-11-17 DIAGNOSIS — R1110 Vomiting, unspecified: Secondary | ICD-10-CM | POA: Diagnosis not present

## 2022-11-17 DIAGNOSIS — Z1211 Encounter for screening for malignant neoplasm of colon: Secondary | ICD-10-CM | POA: Diagnosis not present

## 2022-11-17 DIAGNOSIS — K635 Polyp of colon: Secondary | ICD-10-CM | POA: Diagnosis not present

## 2022-11-17 DIAGNOSIS — R1310 Dysphagia, unspecified: Secondary | ICD-10-CM | POA: Diagnosis not present

## 2022-11-17 DIAGNOSIS — Z933 Colostomy status: Secondary | ICD-10-CM | POA: Diagnosis not present

## 2022-11-17 DIAGNOSIS — Z79899 Other long term (current) drug therapy: Secondary | ICD-10-CM | POA: Diagnosis not present

## 2022-11-17 DIAGNOSIS — D126 Benign neoplasm of colon, unspecified: Secondary | ICD-10-CM | POA: Diagnosis not present

## 2022-11-17 DIAGNOSIS — Z85810 Personal history of malignant neoplasm of tongue: Secondary | ICD-10-CM | POA: Diagnosis not present

## 2022-11-17 NOTE — Patient Outreach (Signed)
  Care Coordination   11/17/2022 Name: Ricardo [name] MRN: [id number] DOB: 19-Oct-1961   Care Coordination Outreach Attempts:  A second unsuccessful outreach was attempted today to offer the patient with information about available care coordination services as a benefit of their health plan.     Follow Up Plan:  Additional outreach attempts will be made to offer the patient care coordination information and services.   Encounter Outcome:  No Answer   Care Coordination Interventions:  No, not indicated   Eduard Clos MSW, LCSW Licensed Clinical Social Worker      [phone]

## 2022-11-18 ENCOUNTER — Telehealth: Payer: Self-pay

## 2022-11-18 NOTE — Patient Outreach (Signed)
  Care Coordination   Initial Visit Note   11/18/2022 Name: Ricardo [name] MRN: [id number] DOB: [birth year]  Ricardo [name] is a 61 y.o. year old male who sees Pcp, No for primary care. I spoke with  Terald Sleeper by phone today.  What matters to the patients health and wellness today?  Placed call to patient today and reviewed [facility] care coordination program. Patient reports that he is doing well with the exception of difficultly swallowing.  The call then dropped. I called back X3 and left a voicemail requesting a call back to finish conversation.     11/19/2022.  Called patient back again with no answer   SDOH assessments and interventions completed:  No     Care Coordination Interventions:  No, not indicated   Follow up plan: No further intervention required.   Encounter Outcome:  Pt. Refused   Tomasa Rand, RN, BSN, CEN [facility] [employer] [phone]

## 2022-11-27 ENCOUNTER — Other Ambulatory Visit (HOSPITAL_COMMUNITY): Payer: Self-pay

## 2022-12-01 ENCOUNTER — Other Ambulatory Visit (HOSPITAL_COMMUNITY): Payer: Self-pay

## 2022-12-02 ENCOUNTER — Encounter: Payer: Self-pay | Admitting: Pharmacist

## 2022-12-03 ENCOUNTER — Other Ambulatory Visit (HOSPITAL_COMMUNITY): Payer: Self-pay

## 2022-12-04 ENCOUNTER — Other Ambulatory Visit (HOSPITAL_COMMUNITY): Payer: Self-pay

## 2022-12-04 ENCOUNTER — Other Ambulatory Visit: Payer: Self-pay

## 2022-12-08 ENCOUNTER — Encounter: Payer: Self-pay | Admitting: Oncology

## 2022-12-09 ENCOUNTER — Other Ambulatory Visit: Payer: Self-pay

## 2022-12-09 ENCOUNTER — Other Ambulatory Visit (HOSPITAL_COMMUNITY): Payer: Self-pay

## 2022-12-10 ENCOUNTER — Other Ambulatory Visit (HOSPITAL_COMMUNITY): Payer: Self-pay

## 2022-12-29 ENCOUNTER — Other Ambulatory Visit (HOSPITAL_COMMUNITY): Payer: Self-pay

## 2023-01-04 ENCOUNTER — Other Ambulatory Visit (HOSPITAL_COMMUNITY): Payer: Self-pay

## 2023-01-05 ENCOUNTER — Other Ambulatory Visit (HOSPITAL_COMMUNITY): Payer: Self-pay

## 2023-01-06 ENCOUNTER — Other Ambulatory Visit: Payer: Self-pay

## 2023-01-06 ENCOUNTER — Telehealth: Payer: Self-pay

## 2023-01-06 ENCOUNTER — Other Ambulatory Visit (HOSPITAL_COMMUNITY): Payer: Self-pay

## 2023-01-06 NOTE — Telephone Encounter (Signed)
[facility] Patient Advocate Encounter   Was successful in obtaining a [employer] copay card for Intelence.  This copay card will make the patients copay $0.00.  I have spoken with the patient.    The billing information is as follows and has been shared with [place].        Ileene Patrick, [facility] Patient [facility] for [facility] Phone: [phone] Fax:  [phone]

## 2023-01-14 ENCOUNTER — Other Ambulatory Visit: Payer: BC Managed Care – PPO

## 2023-01-14 ENCOUNTER — Ambulatory Visit: Payer: BC Managed Care – PPO | Admitting: Oncology

## 2023-01-27 ENCOUNTER — Other Ambulatory Visit (HOSPITAL_COMMUNITY): Payer: Self-pay

## 2023-01-27 MED ORDER — OMEPRAZOLE 40 MG PO CPDR
40.0000 mg | DELAYED_RELEASE_CAPSULE | Freq: Every morning | ORAL | 3 refills | Status: AC
  Filled 2023-01-27: qty 30, 30d supply, fill #0
  Filled 2023-02-25: qty 30, 30d supply, fill #1
  Filled 2023-03-24: qty 30, 30d supply, fill #2

## 2023-01-28 ENCOUNTER — Other Ambulatory Visit (HOSPITAL_COMMUNITY): Payer: Self-pay

## 2023-01-28 ENCOUNTER — Other Ambulatory Visit (HOSPITAL_BASED_OUTPATIENT_CLINIC_OR_DEPARTMENT_OTHER): Payer: Self-pay

## 2023-01-28 ENCOUNTER — Encounter (HOSPITAL_COMMUNITY): Payer: Self-pay

## 2023-01-28 ENCOUNTER — Encounter (HOSPITAL_BASED_OUTPATIENT_CLINIC_OR_DEPARTMENT_OTHER): Payer: Self-pay

## 2023-01-28 ENCOUNTER — Other Ambulatory Visit: Payer: Self-pay

## 2023-01-29 ENCOUNTER — Other Ambulatory Visit (HOSPITAL_COMMUNITY): Payer: Self-pay

## 2023-01-29 ENCOUNTER — Other Ambulatory Visit: Payer: Self-pay

## 2023-02-02 ENCOUNTER — Other Ambulatory Visit (HOSPITAL_COMMUNITY): Payer: Self-pay

## 2023-02-13 ENCOUNTER — Other Ambulatory Visit: Payer: Self-pay | Admitting: Infectious Disease

## 2023-02-13 ENCOUNTER — Other Ambulatory Visit (HOSPITAL_COMMUNITY): Payer: Self-pay

## 2023-02-13 DIAGNOSIS — I10 Essential (primary) hypertension: Secondary | ICD-10-CM

## 2023-02-15 ENCOUNTER — Other Ambulatory Visit (HOSPITAL_COMMUNITY): Payer: Self-pay

## 2023-02-17 NOTE — Progress Notes (Unsigned)
[facility]  [address] [place],  [place]  [address] [phone]  Clinic Day:  02/18/2023  Referring physician: Rush Farmer, MD  HISTORY OF PRESENT ILLNESS:  The patient is a 62 y.o. 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 He completed all of his definitve chemoradiation in October 2022, which consisted of 3 cycles of cisplatin.  A PET scan done afterwards showed a complete response to his chemoradiation.  He comes in today for routine follow-up.  Since his last visit, patient has been doing okay.  The major issue which continues to impact his daily quality of life is suboptimal swallowing.  An upper endoscopy was attempted locally, which proved unsuccessful.  He will be seen at [facility] in the forthcoming weeks to have an EGD done there.  Otherwise, he denies having any symptoms which concern him for disease recurrence.  PHYSICAL EXAM:  Blood pressure 134/75, pulse 91, temperature 98.3 F (36.8 C), temperature source Oral, resp. rate 18, height '5\' 10"'$  (1.778 m), weight 224 lb 3.2 oz (101.7 kg), SpO2 97 %. Wt Readings from Last 3 Encounters:  07/04/21 224 lb 3.2 oz (101.7 kg)  05/08/19 250 lb (113.4 kg)  08/24/18 232 lb (105.2 kg)   Body mass index is 32.17 kg/m. Performance status (ECOG): 1 - Symptomatic but completely ambulatory  Physical Exam Constitutional:      Appearance: Normal appearance. He is not ill-appearing.  HENT:     Mouth/Throat:     Mouth: Mucous membranes are moist.     Pharynx: Oropharynx is clear. No oropharyngeal exudate or posterior oropharyngeal erythema.     Neck: No palpable masses appreciated. Cardiovascular:     Rate and Rhythm: Normal rate and regular rhythm.     Heart sounds: No murmur heard.   No friction rub. No gallop.  Pulmonary:     Effort: Pulmonary effort is normal. No respiratory distress.     Breath sounds: Normal breath sounds. No wheezing, rhonchi or rales.   Chest:  Breasts:    Right: No axillary adenopathy or supraclavicular adenopathy.     Left: No axillary adenopathy or supraclavicular adenopathy.  Abdominal:     General: Bowel sounds are normal. There is no distension.     Palpations: Abdomen is soft. There is no mass.     Tenderness: no abdominal tenderness  Musculoskeletal:        General: No swelling.     Right lower leg: No edema.     Left lower leg: No edema.  Lymphadenopathy:     Right cervical: No superficial cervical adenopathy.    Left cervical: Superficial cervical adenopathy has resolved.     Upper Body:     Right upper body: No supraclavicular or axillary adenopathy.     Left upper body: No supraclavicular or axillary adenopathy.     Lower Body: No right inguinal adenopathy. No left inguinal adenopathy.  Skin:    General: Skin is warm.     Coloration: Skin is not jaundiced.     Findings: Thickened skin changes over his left neck from recent radiation. Neurological:     General: No focal deficit present.     Mental Status: He is alert and oriented to person, place, and time. Mental status is at baseline.     Cranial Nerves: Cranial nerves are intact.  Psychiatric:        Mood and Affect: Mood normal.  Behavior: Behavior normal.        Thought Content: Thought content normal.   ASSESSMENT & PLAN:  A 62 y.o. 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  Based upon his exam today, the patient remains disease-free.  Clinically, he appears to be doing fairly well.  I do feel comfortable seeing him back in 6 months for repeat clinical assessment. The patient understands all the plans discussed today and is in agreement with them.    Cruzita Lipa Macarthur Critchley, MD

## 2023-02-18 ENCOUNTER — Inpatient Hospital Stay: Payer: BC Managed Care – PPO

## 2023-02-18 ENCOUNTER — Other Ambulatory Visit: Payer: Self-pay

## 2023-02-18 ENCOUNTER — Other Ambulatory Visit (HOSPITAL_COMMUNITY): Payer: Self-pay

## 2023-02-18 ENCOUNTER — Encounter: Payer: Self-pay | Admitting: Infectious Disease

## 2023-02-18 ENCOUNTER — Ambulatory Visit (INDEPENDENT_AMBULATORY_CARE_PROVIDER_SITE_OTHER): Payer: BC Managed Care – PPO | Admitting: Infectious Disease

## 2023-02-18 ENCOUNTER — Other Ambulatory Visit (HOSPITAL_COMMUNITY)
Admission: RE | Admit: 2023-02-18 | Discharge: 2023-02-18 | Disposition: A | Discharge status: Home / Self Care | Payer: BC Managed Care – PPO | Source: Ambulatory Visit | Attending: Infectious Disease | Admitting: Infectious Disease

## 2023-02-18 ENCOUNTER — Other Ambulatory Visit: Payer: Self-pay | Admitting: Oncology

## 2023-02-18 ENCOUNTER — Inpatient Hospital Stay: Payer: BC Managed Care – PPO | Attending: Oncology | Admitting: Oncology

## 2023-02-18 VITALS — BP 132/80 | HR 68 | Temp 98.0°F | Resp 16 | Ht 70.0 in | Wt 202.3 lb

## 2023-02-18 VITALS — BP 132/86 | HR 66 | Temp 97.6°F | Resp 16 | Ht 72.0 in | Wt 202.0 lb

## 2023-02-18 DIAGNOSIS — C01 Malignant neoplasm of base of tongue: Secondary | ICD-10-CM | POA: Diagnosis not present

## 2023-02-18 DIAGNOSIS — E1169 Type 2 diabetes mellitus with other specified complication: Secondary | ICD-10-CM

## 2023-02-18 DIAGNOSIS — Z7185 Encounter for immunization safety counseling: Secondary | ICD-10-CM

## 2023-02-18 DIAGNOSIS — E785 Hyperlipidemia, unspecified: Secondary | ICD-10-CM | POA: Diagnosis not present

## 2023-02-18 DIAGNOSIS — Z23 Encounter for immunization: Secondary | ICD-10-CM

## 2023-02-18 DIAGNOSIS — B20 Human immunodeficiency virus [HIV] disease: Secondary | ICD-10-CM | POA: Diagnosis present

## 2023-02-18 HISTORY — DX: Hyperlipidemia, unspecified: E78.5

## 2023-02-18 NOTE — Progress Notes (Signed)
Subjective:  Chief complaint: For HIV disease on medications   Patient ID: Ricardo [name], male    DOB: 28-Nov-1961, 62 y.o.   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 He undergone, radiation and  chemotherapy in October 2022, which consisted of 3 cycles of cisplatin.   After undergoing chemotherapy and radiation he is lost significant amount of weight  Having trouble still with swallowing as can be seen at [facility].  Because he cannot swallow well he is not taking his atorvastatin currently  He also has crushing DESCOVY ingesting that as well after consultation with our pharmacist  Past Medical History:  Diagnosis Date   Abnormal liver function tests 11/16/2016   Abnormal thyroid exam 05/06/2022   Diabetes mellitus type 2 in obese ([place]) 11/20/2015   Diabetes mellitus without complication (HCC)    Diabetes type 2, uncontrolled    HIV (human immunodeficiency virus infection) ([place])    Hyperlipidemia    LVH (left ventricular hypertrophy)    Obesity 05/08/2019   Palpitations    Rash and nonspecific skin eruption 09/15/2016   Thyroid mass 08/14/2021    No past surgical history on file.  Family History  Problem Relation Age of Onset   Coronary artery disease Father 46      Social History   Socioeconomic History   Marital status: Married    Spouse name: Not on file   Number of children: Not on file   Years of education: Not on file   Highest education level: Not on file  Occupational History   Not on file  Tobacco Use   Smoking status: Never   Smokeless tobacco: Never  Substance and Sexual Activity   Alcohol use: No   Drug use: No   Sexual activity: Yes    Partners: Female    Comment: pt. declined condoms  Other Topics Concern   Not on file  Social History  Narrative   Not on file   Social Determinants of Health   Financial Resource Strain: Not on file  Food Insecurity: Not on file  Transportation Needs: Not on file  Physical Activity: Not on file  Stress: Not on file  Social Connections: Not on file    Allergies  Allergen Reactions   Amoxicillin-Pot Clavulanate    Amoxicillin-Pot Clavulanate      Current Outpatient Medications:    atorvastatin (LIPITOR) 10 MG tablet, Take 1 tablet (10 mg total) by mouth daily., Disp: 30 tablet, Rfl: 11   Blood Glucose Monitoring Suppl (ONE TOUCH ULTRA 2) W/DEVICE KIT, Use to check blood sugars 1/day., Disp: 1 each, Rfl: 0   emtricitabine-tenofovir AF (DESCOVY) 200-25 MG tablet, TAKE 1 TABLET BY MOUTH DAILY., Disp: 30 tablet, Rfl: 11   glucose blood (ONE TOUCH ULTRA TEST) test strip, Use to check blood sugars three times daily., Disp: 100 each, Rfl: 11   INTELENCE 200 MG TABS, Take 2 tablets (400 mg total) by mouth daily., Disp: 60 tablet, Rfl: 11   omeprazole (PRILOSEC) 40 MG capsule, Take 1 capsule (40 mg total) by mouth every morning 30 mintes before breakfast. Open capsule and sprinkle on applesauce, Disp: 30 capsule, Rfl: 3   Review of Systems  Constitutional:  Negative for activity change, appetite change, chills, diaphoresis, fatigue, fever and unexpected weight change.  HENT:  Negative for  congestion, rhinorrhea, sinus pressure, sneezing, sore throat and trouble swallowing.   Eyes:  Negative for photophobia and visual disturbance.  Respiratory:  Negative for cough, chest tightness, shortness of breath, wheezing and stridor.   Cardiovascular:  Negative for chest pain, palpitations and leg swelling.  Gastrointestinal:  Negative for abdominal distention, abdominal pain, anal bleeding, blood in stool, constipation, diarrhea, nausea and vomiting.  Genitourinary:  Negative for difficulty urinating, dysuria, flank pain and hematuria.  Musculoskeletal:  Negative for arthralgias, back pain, gait  problem, joint swelling and myalgias.  Skin:  Negative for color change, pallor, rash and wound.  Neurological:  Negative for dizziness, tremors, weakness and light-headedness.  Hematological:  Negative for adenopathy. Does not bruise/bleed easily.  Psychiatric/Behavioral:  Negative for agitation, behavioral problems, confusion, decreased concentration, dysphoric mood and sleep disturbance.        Objective:   Physical Exam Constitutional:      Appearance: He is well-developed.  HENT:     Head: Normocephalic and atraumatic.  Eyes:     Conjunctiva/sclera: Conjunctivae normal.  Cardiovascular:     Rate and Rhythm: Normal rate and regular rhythm.  Pulmonary:     Effort: Pulmonary effort is normal. No respiratory distress.     Breath sounds: No wheezing.  Abdominal:     General: There is no distension.     Palpations: Abdomen is soft.  Musculoskeletal:        General: No tenderness. Normal range of motion.     Cervical back: Normal range of motion and neck supple.  Skin:    General: Skin is warm and dry.     Coloration: Skin is not pale.     Findings: No erythema or rash.  Neurological:     General: No focal deficit present.     Mental Status: He is alert and oriented to person, place, and time.  Psychiatric:        Mood and Affect: Mood normal.        Behavior: Behavior normal.        Thought Content: Thought content normal.        Judgment: Judgment normal.           Assessment & Plan:   HIV disease:  I will add order HIV viral load CD4 count CBC with differential CMP, RPR GC and chlamydia and I will continue  Tenny Craw O'Donnell's Intellence and Descovy prescriptions   HPV 16 positive squamous cell carcinoma of the tongue status postchemotherapy and radiation with resolution on PET scan following up with oncology today  Hyperlipidemia: Can be on a statin but I do not know if the 1 he is on currently can be crushed will confer with pharmacy.     Vaccine  counseling recommended Prevnar 20 which we gave today

## 2023-02-18 NOTE — Addendum Note (Signed)
Addended by: Tomi Bamberger on: 02/18/2023 11:59 AM   Modules accepted: Orders

## 2023-02-19 ENCOUNTER — Telehealth: Payer: Self-pay | Admitting: Pharmacist

## 2023-02-19 LAB — URINE CYTOLOGY ANCILLARY ONLY
Chlamydia: NEGATIVE
Comment: NEGATIVE
Comment: NORMAL
Neisseria Gonorrhea: NEGATIVE

## 2023-02-19 LAB — T-HELPER CELLS (CD4) COUNT (NOT AT ARMC)
CD4 % Helper T Cell: 28 % — ABNORMAL LOW (ref 33–65)
CD4 T Cell Abs: 302 /uL — ABNORMAL LOW (ref 400–1790)

## 2023-02-19 NOTE — Telephone Encounter (Signed)
Spoke with patient over the phone today to discuss liquid statin option for him given his difficulty swallowing. [facility] would be able to fill Atorvaliq which is a liquid version of Lipitor. However, the copay is $60, and the patient is not able to pay for this at this time. Will look into further options next week.  Alfonse Spruce, PharmD, CPP, BCIDP, [place] Clinical Pharmacist Practitioner Infectious [place] for Infectious Disease

## 2023-02-20 LAB — COMPLETE METABOLIC PANEL WITH GFR
AG Ratio: 1.7 (calc) (ref 1.0–2.5)
ALT: 12 U/L (ref 9–46)
AST: 16 U/L (ref 10–35)
Albumin: 4.5 g/dL (ref 3.6–5.1)
Alkaline phosphatase (APISO): 56 U/L (ref 35–144)
BUN: 18 mg/dL (ref 7–25)
CO2: 27 mmol/L (ref 20–32)
Calcium: 9.3 mg/dL (ref 8.6–10.3)
Chloride: 103 mmol/L (ref 98–110)
Creat: 1.31 mg/dL (ref 0.70–1.35)
Globulin: 2.7 g/dL (calc) (ref 1.9–3.7)
Glucose, Bld: 132 mg/dL — ABNORMAL HIGH (ref 65–99)
Potassium: 4.4 mmol/L (ref 3.5–5.3)
Sodium: 137 mmol/L (ref 135–146)
Total Bilirubin: 0.4 mg/dL (ref 0.2–1.2)
Total Protein: 7.2 g/dL (ref 6.1–8.1)
eGFR: 62 mL/min/{1.73_m2} (ref >=60)

## 2023-02-20 LAB — CBC WITH DIFFERENTIAL/PLATELET
Absolute Monocytes: 340 cells/uL (ref 200–950)
Basophils Absolute: 40 cells/uL (ref 0–200)
Basophils Relative: 1.2 %
Eosinophils Absolute: 59 cells/uL (ref 15–500)
Eosinophils Relative: 1.8 %
HCT: 42 % (ref 38.5–50.0)
Hemoglobin: 14.1 g/dL (ref 13.2–17.1)
Lymphs Abs: 1221 cells/uL (ref 850–3900)
MCH: 31.1 pg (ref 27.0–33.0)
MCHC: 33.6 g/dL (ref 32.0–36.0)
MCV: 92.5 fL (ref 80.0–100.0)
MPV: 9.9 fL (ref 7.5–12.5)
Monocytes Relative: 10.3 %
Neutro Abs: 1640 cells/uL (ref 1500–7800)
Neutrophils Relative %: 49.7 %
Platelets: 221 10*3/uL (ref 140–400)
RBC: 4.54 10*6/uL (ref 4.20–5.80)
RDW: 12.9 % (ref 11.0–15.0)
Total Lymphocyte: 37 %
WBC: 3.3 10*3/uL — ABNORMAL LOW (ref 3.8–10.8)

## 2023-02-20 LAB — LIPID PANEL
Cholesterol: 276 mg/dL — ABNORMAL HIGH (ref <200)
HDL: 38 mg/dL — ABNORMAL LOW (ref >=40)
LDL Cholesterol (Calc): 178 mg/dL (calc) — ABNORMAL HIGH
Non-HDL Cholesterol (Calc): 238 mg/dL (calc) — ABNORMAL HIGH (ref <130)
Total CHOL/HDL Ratio: 7.3 (calc) — ABNORMAL HIGH (ref <5.0)
Triglycerides: 400 mg/dL — ABNORMAL HIGH (ref <150)

## 2023-02-20 LAB — HIV-1 RNA QUANT-NO REFLEX-BLD
HIV 1 RNA Quant: NOT DETECTED Copies/mL
HIV-1 RNA Quant, Log: NOT DETECTED Log cps/mL

## 2023-02-20 LAB — RPR: RPR Ser Ql: NONREACTIVE

## 2023-02-23 ENCOUNTER — Telehealth: Payer: Self-pay | Admitting: Pharmacist

## 2023-02-23 ENCOUNTER — Other Ambulatory Visit (HOSPITAL_COMMUNITY): Payer: Self-pay

## 2023-02-23 DIAGNOSIS — E785 Hyperlipidemia, unspecified: Secondary | ICD-10-CM

## 2023-02-23 MED ORDER — PRAVASTATIN SODIUM 40 MG PO TABS
40.0000 mg | ORAL_TABLET | Freq: Every day | ORAL | 5 refills | Status: DC
  Filled 2023-02-23: qty 30, 30d supply, fill #0

## 2023-02-23 NOTE — Telephone Encounter (Signed)
Patient agrees to trial pravastatin '40mg'$  once daily dissolved in liquid everyday. This is not recommended in guidelines or the labeling; however, some patients have had success with this and this is not considered to cause harm. Script sent to [facility] for mailing; copay is $5.  Alfonse Spruce, PharmD, CPP, BCIDP, [place] for Infectious Disease

## 2023-02-25 ENCOUNTER — Other Ambulatory Visit (HOSPITAL_COMMUNITY): Payer: Self-pay

## 2023-03-01 ENCOUNTER — Other Ambulatory Visit: Payer: Self-pay

## 2023-03-02 ENCOUNTER — Other Ambulatory Visit: Payer: Self-pay

## 2023-03-02 ENCOUNTER — Other Ambulatory Visit (HOSPITAL_COMMUNITY): Payer: Self-pay

## 2023-03-24 ENCOUNTER — Other Ambulatory Visit: Payer: Self-pay | Admitting: Internal Medicine

## 2023-03-24 ENCOUNTER — Other Ambulatory Visit (HOSPITAL_COMMUNITY): Payer: Self-pay

## 2023-03-25 ENCOUNTER — Other Ambulatory Visit (HOSPITAL_COMMUNITY): Payer: Self-pay

## 2023-03-26 ENCOUNTER — Other Ambulatory Visit (HOSPITAL_COMMUNITY): Payer: Self-pay

## 2023-03-29 ENCOUNTER — Other Ambulatory Visit: Payer: Self-pay

## 2023-03-29 ENCOUNTER — Other Ambulatory Visit (HOSPITAL_COMMUNITY): Payer: Self-pay

## 2023-03-30 ENCOUNTER — Other Ambulatory Visit (HOSPITAL_COMMUNITY): Payer: Self-pay

## 2023-03-30 ENCOUNTER — Encounter (HOSPITAL_COMMUNITY): Payer: Self-pay

## 2023-04-05 ENCOUNTER — Other Ambulatory Visit: Payer: Self-pay

## 2023-04-21 ENCOUNTER — Other Ambulatory Visit (HOSPITAL_COMMUNITY): Payer: Self-pay

## 2023-04-21 ENCOUNTER — Other Ambulatory Visit: Payer: Self-pay

## 2023-04-21 ENCOUNTER — Other Ambulatory Visit: Payer: Self-pay | Admitting: Infectious Disease

## 2023-04-21 DIAGNOSIS — B20 Human immunodeficiency virus [HIV] disease: Secondary | ICD-10-CM

## 2023-04-21 MED ORDER — DESCOVY 200-25 MG PO TABS
1.0000 | ORAL_TABLET | Freq: Every day | ORAL | 11 refills | Status: DC
Start: 2023-04-21 — End: 2023-08-19
  Filled 2023-04-21: qty 30, 30d supply, fill #0
  Filled 2023-05-14: qty 30, 30d supply, fill #1
  Filled 2023-06-16: qty 30, 30d supply, fill #2
  Filled 2023-07-13: qty 30, 30d supply, fill #3
  Filled 2023-08-10: qty 30, 30d supply, fill #4

## 2023-04-21 MED ORDER — INTELENCE 200 MG PO TABS
400.0000 mg | ORAL_TABLET | Freq: Every day | ORAL | 11 refills | Status: DC
Start: 2023-04-21 — End: 2023-08-19
  Filled 2023-04-21: qty 60, 30d supply, fill #0
  Filled 2023-05-14: qty 60, 30d supply, fill #1
  Filled 2023-06-16: qty 60, 30d supply, fill #2
  Filled 2023-07-13: qty 60, 30d supply, fill #3
  Filled 2023-08-10: qty 60, 30d supply, fill #4

## 2023-04-23 ENCOUNTER — Other Ambulatory Visit (HOSPITAL_COMMUNITY): Payer: Self-pay

## 2023-04-23 ENCOUNTER — Other Ambulatory Visit: Payer: Self-pay

## 2023-04-26 ENCOUNTER — Other Ambulatory Visit (HOSPITAL_COMMUNITY): Payer: Self-pay

## 2023-05-14 ENCOUNTER — Other Ambulatory Visit: Payer: Self-pay

## 2023-05-20 ENCOUNTER — Other Ambulatory Visit (HOSPITAL_COMMUNITY): Payer: Self-pay

## 2023-05-21 ENCOUNTER — Other Ambulatory Visit: Payer: Self-pay

## 2023-05-21 ENCOUNTER — Other Ambulatory Visit (HOSPITAL_COMMUNITY): Payer: Self-pay

## 2023-05-24 ENCOUNTER — Other Ambulatory Visit (HOSPITAL_COMMUNITY): Payer: Self-pay

## 2023-05-25 ENCOUNTER — Other Ambulatory Visit (HOSPITAL_COMMUNITY): Payer: Self-pay

## 2023-06-16 ENCOUNTER — Other Ambulatory Visit (HOSPITAL_COMMUNITY): Payer: Self-pay

## 2023-06-18 ENCOUNTER — Other Ambulatory Visit (HOSPITAL_COMMUNITY): Payer: Self-pay

## 2023-06-21 ENCOUNTER — Other Ambulatory Visit: Payer: Self-pay

## 2023-06-25 ENCOUNTER — Other Ambulatory Visit: Payer: Self-pay

## 2023-07-13 ENCOUNTER — Other Ambulatory Visit (HOSPITAL_COMMUNITY): Payer: Self-pay

## 2023-07-19 ENCOUNTER — Other Ambulatory Visit: Payer: Self-pay

## 2023-07-26 ENCOUNTER — Other Ambulatory Visit: Payer: Self-pay

## 2023-08-10 ENCOUNTER — Other Ambulatory Visit (HOSPITAL_COMMUNITY): Payer: Self-pay

## 2023-08-18 ENCOUNTER — Other Ambulatory Visit (HOSPITAL_COMMUNITY): Payer: Self-pay

## 2023-08-18 NOTE — Progress Notes (Unsigned)
[facility]  [address] [place],  [place]  [address] [phone]  Clinic Day:  08/19/2023  Referring physician: Kelby Aline, MD  HISTORY OF PRESENT ILLNESS:  The patient is a 62 y.o. 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 He completed all of his definitve chemoradiation in October 2022, which consisted of 3 cycles of cisplatin.  A PET scan done afterwards showed a complete response to his chemoradiation.  He comes in today for routine follow-up.  Since his last visit, patient has been doing okay.  The major issue which continues to impact his daily quality of life is suboptimal swallowing.  He did has esophageal stretching per an EGD done at [facility] a few months ago, from which he did notice an improvement.  However, he is feeling esophageal tightness again.  Otherwise, he denies having any symptoms which concern him for disease recurrence.   PHYSICAL EXAM:  There were no vitals taken for this visit. Wt Readings from Last 3 Encounters:  02/18/23 202 lb 4.8 oz (91.8 kg)  02/18/23 202 lb (91.6 kg)  09/14/22 203 lb 14.4 oz (92.5 kg)   There is no height or weight on file to calculate BMI. Performance status (ECOG): 1 - Symptomatic but completely ambulatory Physical Exam Constitutional:      Appearance: Normal appearance. He is not ill-appearing.  HENT:     Head:     Comments: Chronic neck tautness from previous radiation    Mouth/Throat:     Mouth: Mucous membranes are moist.     Pharynx: Oropharynx is clear. No oropharyngeal exudate or posterior oropharyngeal erythema.  Cardiovascular:     Rate and Rhythm: Normal rate and regular rhythm.     Heart sounds: No murmur heard.    No friction rub. No gallop.  Pulmonary:     Effort: Pulmonary effort is normal. No respiratory distress.     Breath sounds: Normal breath sounds. No wheezing, rhonchi or rales.  Abdominal:     General: Bowel sounds are normal.  There is no distension.     Palpations: Abdomen is soft. There is no mass.     Tenderness: There is no abdominal tenderness.  Musculoskeletal:        General: No swelling.     Right lower leg: No edema.     Left lower leg: No edema.  Lymphadenopathy:     Cervical: No cervical adenopathy.     Upper Body:     Right upper body: No supraclavicular or axillary adenopathy.     Left upper body: No supraclavicular or axillary adenopathy.     Lower Body: No right inguinal adenopathy. No left inguinal adenopathy.  Skin:    General: Skin is warm.     Coloration: Skin is not jaundiced.     Findings: No lesion or rash.  Neurological:     General: No focal deficit present.     Mental Status: He is alert and oriented to person, place, and time. Mental status is at baseline.  Psychiatric:        Mood and Affect: Mood normal.        Behavior: Behavior normal.        Thought Content: Thought content normal.    LABS:      Latest Ref Rng & Units 02/18/2023   11:55 AM 09/14/2022   12:00 AM 05/13/2022   12:00 AM  CBC  WBC 3.8 - 10.8 Thousand/uL 3.3  3.1     4.0      Hemoglobin 13.2 - 17.1 g/dL 57.8  46.9     62.9      Hematocrit 38.5 - 50.0 % 42.0  38     35      Platelets 140 - 400 Thousand/uL 221  200     219         This result is from an external source.      Latest Ref Rng & Units 02/18/2023   11:55 AM 09/14/2022   12:00 AM 05/13/2022   12:00 AM  CMP  Glucose 65 - 99 mg/dL 528     BUN 7 - 25 mg/dL 18  22     24       Creatinine 0.70 - 1.35 mg/dL 4.13  1.1     1.0      Sodium 135 - 146 mmol/L 137  137     141      Potassium 3.5 - 5.3 mmol/L 4.4  4.0     4.1      Chloride 98 - 110 mmol/L 103  105     104      CO2 20 - 32 mmol/L 27  30     28       Calcium 8.6 - 10.3 mg/dL 9.3  9.3     9.1      Total Protein 6.1 - 8.1 g/dL 7.2     Total Bilirubin 0.2 - 1.2 mg/dL 0.4     Alkaline Phos 25 - 125  61     55      AST 10 - 35 U/L 16  29     34      ALT 9 - 46 U/L 12  16     23          This  result is from an external source.   ASSESSMENT & PLAN:  A 62 y.o. 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  Based upon his exam today, the patient remains disease-free.  Clinically, he appears to be doing fairly well.  I do feel comfortable seeing him back in 6 months for repeat clinical assessment. The patient understands all the plans discussed today and is in agreement with them.    Bronda Alfred Kirby Funk, MD

## 2023-08-19 ENCOUNTER — Ambulatory Visit (INDEPENDENT_AMBULATORY_CARE_PROVIDER_SITE_OTHER): Payer: BC Managed Care – PPO | Admitting: Infectious Disease

## 2023-08-19 ENCOUNTER — Encounter: Payer: Self-pay | Admitting: Infectious Disease

## 2023-08-19 ENCOUNTER — Other Ambulatory Visit: Payer: Self-pay

## 2023-08-19 ENCOUNTER — Inpatient Hospital Stay: Payer: BC Managed Care – PPO | Admitting: Oncology

## 2023-08-19 ENCOUNTER — Inpatient Hospital Stay: Payer: BC Managed Care – PPO

## 2023-08-19 ENCOUNTER — Other Ambulatory Visit (HOSPITAL_COMMUNITY)
Admission: RE | Admit: 2023-08-19 | Discharge: 2023-08-19 | Disposition: A | Discharge status: Home / Self Care | Payer: BC Managed Care – PPO | Source: Ambulatory Visit | Attending: Infectious Disease | Admitting: Infectious Disease

## 2023-08-19 ENCOUNTER — Other Ambulatory Visit (HOSPITAL_COMMUNITY): Payer: Self-pay

## 2023-08-19 ENCOUNTER — Other Ambulatory Visit: Payer: Self-pay | Admitting: Oncology

## 2023-08-19 VITALS — BP 130/79 | HR 74 | Temp 98.1°F | Resp 16 | Ht 70.0 in | Wt 201.8 lb

## 2023-08-19 VITALS — Wt 202.0 lb

## 2023-08-19 DIAGNOSIS — E785 Hyperlipidemia, unspecified: Secondary | ICD-10-CM | POA: Diagnosis not present

## 2023-08-19 DIAGNOSIS — B20 Human immunodeficiency virus [HIV] disease: Secondary | ICD-10-CM | POA: Insufficient documentation

## 2023-08-19 DIAGNOSIS — Z23 Encounter for immunization: Secondary | ICD-10-CM | POA: Diagnosis not present

## 2023-08-19 DIAGNOSIS — Z85810 Personal history of malignant neoplasm of tongue: Secondary | ICD-10-CM | POA: Insufficient documentation

## 2023-08-19 DIAGNOSIS — C01 Malignant neoplasm of base of tongue: Secondary | ICD-10-CM

## 2023-08-19 DIAGNOSIS — Z9221 Personal history of antineoplastic chemotherapy: Secondary | ICD-10-CM | POA: Diagnosis not present

## 2023-08-19 DIAGNOSIS — Z923 Personal history of irradiation: Secondary | ICD-10-CM | POA: Diagnosis not present

## 2023-08-19 LAB — CMP (CANCER CENTER ONLY)
ALT: 18 U/L (ref 0–44)
AST: 21 U/L (ref 15–41)
Albumin: 4.1 g/dL (ref 3.5–5.0)
Alkaline Phosphatase: 51 U/L (ref 38–126)
Anion gap: 9 (ref 5–15)
BUN: 16 mg/dL (ref 8–23)
CO2: 27 mmol/L (ref 22–32)
Calcium: 9.1 mg/dL (ref 8.9–10.3)
Chloride: 106 mmol/L (ref 98–111)
Creatinine: 1.15 mg/dL (ref 0.61–1.24)
GFR, Estimated: >60 mL/min (ref >60)
Glucose, Bld: 97 mg/dL (ref 70–99)
Potassium: 3.9 mmol/L (ref 3.5–5.1)
Sodium: 142 mmol/L (ref 135–145)
Total Bilirubin: 0.3 mg/dL (ref 0.3–1.2)
Total Protein: 7.6 g/dL (ref 6.5–8.1)

## 2023-08-19 LAB — CBC AND DIFFERENTIAL
HCT: 37 — AB (ref 41–53)
Hemoglobin: 12.9 — AB (ref 13.5–17.5)
Neutrophils Absolute: 1.6
Platelets: 218 10*3/uL (ref 150–400)
WBC: 3.4

## 2023-08-19 LAB — TSH: TSH: 3.108 u[IU]/mL (ref 0.350–4.500)

## 2023-08-19 LAB — CBC: RBC: 4.1 (ref 3.87–5.11)

## 2023-08-19 MED ORDER — DESCOVY 200-25 MG PO TABS
1.0000 | ORAL_TABLET | Freq: Every day | ORAL | 11 refills | Status: DC
  Filled 2023-08-19: qty 30, 30d supply, fill #0
  Filled 2023-09-10: qty 30, 30d supply, fill #1
  Filled 2023-10-13 – 2023-10-25 (×3): qty 30, 30d supply, fill #2
  Filled 2023-11-12: qty 30, 30d supply, fill #3
  Filled 2023-12-10: qty 30, 30d supply, fill #4
  Filled 2024-01-11: qty 30, 30d supply, fill #5
  Filled 2024-02-11: qty 30, 30d supply, fill #6
  Filled 2024-03-10: qty 30, 30d supply, fill #7
  Filled 2024-04-07: qty 30, 30d supply, fill #8
  Filled 2024-05-02: qty 30, 30d supply, fill #9
  Filled 2024-06-05 – 2024-06-07 (×2): qty 30, 30d supply, fill #10
  Filled 2024-06-29: qty 30, 30d supply, fill #11

## 2023-08-19 MED ORDER — INTELENCE 200 MG PO TABS
400.0000 mg | ORAL_TABLET | Freq: Every day | ORAL | 11 refills | Status: DC
  Filled 2023-08-19: qty 60, 30d supply, fill #0
  Filled 2023-09-10: qty 60, 30d supply, fill #1
  Filled 2023-10-13 – 2023-10-25 (×2): qty 60, 30d supply, fill #2
  Filled 2023-11-12: qty 60, 30d supply, fill #3
  Filled 2023-12-10: qty 60, 30d supply, fill #4
  Filled 2024-01-11: qty 60, 30d supply, fill #5
  Filled 2024-02-11: qty 60, 30d supply, fill #6
  Filled 2024-03-10: qty 60, 30d supply, fill #7
  Filled 2024-04-07: qty 60, 30d supply, fill #8
  Filled 2024-05-02: qty 60, 30d supply, fill #9
  Filled 2024-06-05: qty 60, 30d supply, fill #10
  Filled 2024-06-29: qty 60, 30d supply, fill #11

## 2023-08-19 NOTE — Progress Notes (Signed)
Subjective:  Chief complaint: Follow-up for HIV disease on medications  Patient ID: Ricardo [name], male    DOB: May 07, 1961, 62 y.o.   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 He undergone, radiation and  chemotherapy in October 2022, which consisted of 3 cycles of cisplatin.   After undergoing chemotherapy and radiation he is lost significant amount of weight   He also has crushing DESCOVY ingesting that as well after consultation with our pharmacist and taking dissolved crushed intelence 400mg  daily.  Better able to swallow now that he has had some dilation procedures done at [facility].  He is also going to have 1 done here in [place]  He is just seeing oncology and is remaining disease-free after completing treatment in 2022  Past Medical History:  Diagnosis Date   Abnormal liver function tests 11/16/2016   Abnormal thyroid exam 05/06/2022   Diabetes mellitus type 2 in obese 11/20/2015   Diabetes mellitus without complication (HCC)    Diabetes type 2, uncontrolled    HIV (human immunodeficiency virus infection) (HCC)    Hyperlipidemia    Hyperlipidemia 02/18/2023   LVH (left ventricular hypertrophy)    Obesity 05/08/2019   Palpitations    Rash and nonspecific skin eruption 09/15/2016   Thyroid mass 08/14/2021    No past surgical history on file.  Family History  Problem Relation Age of Onset   Coronary artery disease Father 48      Social History   Socioeconomic History   Marital status: Married    Spouse name: Not on file   Number of children: Not on file   Years of education: Not on file   Highest education level: Not on file  Occupational History   Not on file  Tobacco Use   Smoking status: Never   Smokeless tobacco: Never  Substance and Sexual  Activity   Alcohol use: No   Drug use: No   Sexual activity: Yes    Partners: Female    Comment: pt. declined condoms  Other Topics Concern   Not on file  Social History Narrative   Not on file   Social Determinants of Health   Financial Resource Strain: Not on file  Food Insecurity: Not on file  Transportation Needs: Not on file  Physical Activity: Not on file  Stress: Not on file  Social Connections: Not on file    Allergies  Allergen Reactions   Amoxicillin-Pot Clavulanate    Amoxicillin-Pot Clavulanate      Current Outpatient Medications:    Blood Glucose Monitoring Suppl (ONE TOUCH ULTRA 2) W/DEVICE KIT, Use to check blood sugars 1/day., Disp: 1 each, Rfl: 0   emtricitabine-tenofovir AF (DESCOVY) 200-25 MG tablet, TAKE 1 TABLET BY MOUTH DAILY., Disp: 30 tablet, Rfl: 11   glucose blood (ONE TOUCH ULTRA TEST) test strip, Use to check blood sugars three times daily., Disp: 100 each, Rfl: 11   INTELENCE 200 MG TABS, Take 2 tablets (400 mg total) by mouth daily., Disp: 60 tablet, Rfl: 11   omeprazole (PRILOSEC) 40 MG capsule, Take 1 capsule (40 mg total) by mouth every morning 30 mintes before breakfast. Open capsule and sprinkle on applesauce, Disp: 30 capsule, Rfl: 3   pravastatin (PRAVACHOL) 40 MG tablet, Take 1 tablet (40 mg total) by mouth daily. Crush or dissolve in  liquid if trouble swallowing, Disp: 30 tablet, Rfl: 5   Review of Systems  Constitutional:  Negative for activity change, appetite change, chills, diaphoresis, fatigue, fever and unexpected weight change.  HENT:  Negative for congestion, rhinorrhea, sinus pressure, sneezing, sore throat and trouble swallowing.   Eyes:  Negative for photophobia and visual disturbance.  Respiratory:  Negative for cough, chest tightness, shortness of breath, wheezing and stridor.   Cardiovascular:  Negative for chest pain, palpitations and leg swelling.  Gastrointestinal:  Negative for abdominal distention, abdominal pain,  anal bleeding, blood in stool, constipation, diarrhea, nausea and vomiting.  Genitourinary:  Negative for difficulty urinating, dysuria, flank pain and hematuria.  Musculoskeletal:  Negative for arthralgias, back pain, gait problem, joint swelling and myalgias.  Skin:  Negative for color change, pallor, rash and wound.  Neurological:  Negative for dizziness, tremors, weakness and light-headedness.  Hematological:  Negative for adenopathy. Does not bruise/bleed easily.  Psychiatric/Behavioral:  Negative for agitation, behavioral problems, confusion, decreased concentration, dysphoric mood and sleep disturbance.        Objective:   Physical Exam Constitutional:      Appearance: He is well-developed.  HENT:     Head: Normocephalic and atraumatic.  Eyes:     Conjunctiva/sclera: Conjunctivae normal.  Cardiovascular:     Rate and Rhythm: Normal rate and regular rhythm.  Pulmonary:     Effort: Pulmonary effort is normal. No respiratory distress.     Breath sounds: No wheezing.  Abdominal:     General: There is no distension.     Palpations: Abdomen is soft.  Musculoskeletal:        General: No tenderness. Normal range of motion.     Cervical back: Normal range of motion and neck supple.  Skin:    General: Skin is warm and dry.     Coloration: Skin is not pale.     Findings: No erythema or rash.  Neurological:     General: No focal deficit present.     Mental Status: He is alert and oriented to person, place, and time.  Psychiatric:        Mood and Affect: Mood normal.        Behavior: Behavior normal.        Thought Content: Thought content normal.        Judgment: Judgment normal.           Assessment & Plan:   HIV disease:  I will add order HIV viral load CD4 count CBC with differential CMP, RPR GC and chlamydia and I will continue  Ricardo [name]'s intelligence and DESCOVY prescriptions  HPV positive squamous cell carcinoma the tongue status post  chemotherapy and radiation with resolution of PET scan: Has seen oncology again in is continuing to improve without evidence of recurrence  Vaccine counseling: recommended and he received flu vaccine

## 2023-08-20 ENCOUNTER — Other Ambulatory Visit (HOSPITAL_COMMUNITY): Payer: Self-pay

## 2023-08-20 LAB — T-HELPER CELLS (CD4) COUNT (NOT AT ARMC)
CD4 % Helper T Cell: 28 % — ABNORMAL LOW (ref 33–65)
CD4 T Cell Abs: 320 /uL — ABNORMAL LOW (ref 400–1790)

## 2023-08-21 LAB — COMPLETE METABOLIC PANEL WITH GFR
AG Ratio: 1.6 (calc) (ref 1.0–2.5)
ALT: 13 U/L (ref 9–46)
AST: 18 U/L (ref 10–35)
Albumin: 4.6 g/dL (ref 3.6–5.1)
Alkaline phosphatase (APISO): 58 U/L (ref 35–144)
BUN: 15 mg/dL (ref 7–25)
CO2: 28 mmol/L (ref 20–32)
Calcium: 9.6 mg/dL (ref 8.6–10.3)
Chloride: 105 mmol/L (ref 98–110)
Creat: 1.17 mg/dL (ref 0.70–1.35)
Globulin: 2.9 g/dL (ref 1.9–3.7)
Glucose, Bld: 107 mg/dL — ABNORMAL HIGH (ref 65–99)
Potassium: 4.2 mmol/L (ref 3.5–5.3)
Sodium: 141 mmol/L (ref 135–146)
Total Bilirubin: 0.4 mg/dL (ref 0.2–1.2)
Total Protein: 7.5 g/dL (ref 6.1–8.1)
eGFR: 70 mL/min/{1.73_m2} (ref >=60)

## 2023-08-21 LAB — CBC WITH DIFFERENTIAL/PLATELET
Absolute Monocytes: 340 {cells}/uL (ref 200–950)
Basophils Absolute: 20 {cells}/uL (ref 0–200)
Basophils Relative: 0.6 %
Eosinophils Absolute: 79 {cells}/uL (ref 15–500)
Eosinophils Relative: 2.4 %
HCT: 40.3 % (ref 38.5–50.0)
Hemoglobin: 13.6 g/dL (ref 13.2–17.1)
Lymphs Abs: 1145 {cells}/uL (ref 850–3900)
MCH: 31.2 pg (ref 27.0–33.0)
MCHC: 33.7 g/dL (ref 32.0–36.0)
MCV: 92.4 fL (ref 80.0–100.0)
MPV: 9.8 fL (ref 7.5–12.5)
Monocytes Relative: 10.3 %
Neutro Abs: 1716 {cells}/uL (ref 1500–7800)
Neutrophils Relative %: 52 %
Platelets: 236 10*3/uL (ref 140–400)
RBC: 4.36 10*6/uL (ref 4.20–5.80)
RDW: 13 % (ref 11.0–15.0)
Total Lymphocyte: 34.7 %
WBC: 3.3 10*3/uL — ABNORMAL LOW (ref 3.8–10.8)

## 2023-08-21 LAB — LIPID PANEL
Cholesterol: 239 mg/dL — ABNORMAL HIGH (ref <200)
HDL: 35 mg/dL — ABNORMAL LOW (ref >=40)
LDL Cholesterol (Calc): 144 mg/dL — ABNORMAL HIGH
Non-HDL Cholesterol (Calc): 204 mg/dL — ABNORMAL HIGH (ref <130)
Total CHOL/HDL Ratio: 6.8 (calc) — ABNORMAL HIGH (ref <5.0)
Triglycerides: 392 mg/dL — ABNORMAL HIGH (ref <150)

## 2023-08-21 LAB — HIV-1 RNA QUANT-NO REFLEX-BLD
HIV 1 RNA Quant: NOT DETECTED {copies}/mL
HIV-1 RNA Quant, Log: NOT DETECTED {Log_copies}/mL

## 2023-08-21 LAB — RPR: RPR Ser Ql: NONREACTIVE

## 2023-08-24 ENCOUNTER — Other Ambulatory Visit (HOSPITAL_COMMUNITY): Payer: Self-pay

## 2023-08-26 LAB — URINE CYTOLOGY ANCILLARY ONLY
Chlamydia: NEGATIVE
Comment: NEGATIVE
Comment: NORMAL
Neisseria Gonorrhea: NEGATIVE

## 2023-09-09 ENCOUNTER — Other Ambulatory Visit (HOSPITAL_COMMUNITY): Payer: Self-pay

## 2023-09-10 ENCOUNTER — Other Ambulatory Visit (HOSPITAL_COMMUNITY): Payer: Self-pay

## 2023-09-20 ENCOUNTER — Other Ambulatory Visit: Payer: Self-pay

## 2023-10-04 ENCOUNTER — Other Ambulatory Visit (HOSPITAL_COMMUNITY): Payer: Self-pay

## 2023-10-12 ENCOUNTER — Other Ambulatory Visit (HOSPITAL_COMMUNITY): Payer: Self-pay

## 2023-10-13 ENCOUNTER — Other Ambulatory Visit: Payer: Self-pay

## 2023-10-13 NOTE — Progress Notes (Signed)
Specialty Pharmacy Refill Coordination Note  Ricardo [name] is a 62 y.o. male contacted today regarding refills of specialty medication(s) Emtricitabine-Tenofovir Af   Patient requested Delivery   Delivery date: 10/21/23   Verified address: [address] [place], [address]   Medication will be filled on 10/20/23.

## 2023-10-20 ENCOUNTER — Other Ambulatory Visit (HOSPITAL_COMMUNITY): Payer: Self-pay

## 2023-10-20 ENCOUNTER — Other Ambulatory Visit: Payer: Self-pay

## 2023-10-20 ENCOUNTER — Telehealth: Payer: Self-pay

## 2023-10-20 NOTE — Telephone Encounter (Signed)
[facility] Patient Advocate Encounter   Received notification from [facility] that prior authorization for Descovy is required.   PA submitted on 10/20/23 Key [id number] Status is pending    [facility] Clinic will continue to follow.   Clearance Coots, CPhT Specialty Pharmacy Patient [facility] Phone: [phone] Fax:  [phone]

## 2023-10-20 NOTE — Progress Notes (Signed)
Patient was contacted today, Descovy requires a prior authorization and Intelence is refill too soon until 10/21/23.

## 2023-10-21 ENCOUNTER — Other Ambulatory Visit: Payer: Self-pay

## 2023-10-22 ENCOUNTER — Other Ambulatory Visit (HOSPITAL_COMMUNITY): Payer: Self-pay

## 2023-10-25 ENCOUNTER — Telehealth: Payer: Self-pay

## 2023-10-25 ENCOUNTER — Other Ambulatory Visit: Payer: Self-pay

## 2023-10-25 ENCOUNTER — Other Ambulatory Visit (HOSPITAL_COMMUNITY): Payer: Self-pay

## 2023-10-25 NOTE — Progress Notes (Signed)
Patient was left a voice mail, Prior Authorization approved and medications will be shipped 10/25/23

## 2023-10-25 NOTE — Telephone Encounter (Signed)
[facility] Patient Advocate Encounter  Prior Authorization for Descovy has been approved.    PA# [id number] Effective dates: 10/22/23 through 10/21/24  Patients co-pay is $0.00.   [facility] will continue to follow.  Clearance Coots, CPhT Specialty Pharmacy Patient [facility] for Infectious Disease Phone: [phone] Fax:  [phone]

## 2023-11-09 ENCOUNTER — Other Ambulatory Visit: Payer: Self-pay

## 2023-11-10 ENCOUNTER — Other Ambulatory Visit: Payer: Self-pay

## 2023-11-12 ENCOUNTER — Other Ambulatory Visit: Payer: Self-pay

## 2023-11-12 NOTE — Progress Notes (Signed)
Specialty Pharmacy Ongoing Clinical Assessment Note  Ricardo [name] is a 62 y.o. male who is being followed by the specialty pharmacy service for RxSp HIV   Patient's specialty medication(s) reviewed today: Emtricitabine-Tenofovir Af; Etravirine   Missed doses in the last 4 weeks: 0   Patient/Caregiver did not have any additional questions or concerns.   Therapeutic benefit summary: Patient is achieving benefit   Adverse events/side effects summary: No adverse events/side effects   Patient's therapy is appropriate to: Continue    Goals Addressed             This Visit's Progress    Achieve Undetectable HIV Viral Load < 20       Patient is on track. Patient will maintain adherence. Viral load remains undetectable long term.          Follow up:  6 months  Otto Herb Specialty Pharmacist

## 2023-11-12 NOTE — Progress Notes (Signed)
Specialty Pharmacy Refill Coordination Note  Ricardo [name] is a 62 y.o. male contacted today regarding refills of specialty medication(s) Emtricitabine-Tenofovir Af; Etravirine   Patient requested Delivery   Delivery date: 11/23/23   Verified address: [address] [place], [address]   Medication will be filled on 11/22/23.

## 2023-11-22 ENCOUNTER — Other Ambulatory Visit: Payer: Self-pay

## 2023-12-10 ENCOUNTER — Other Ambulatory Visit: Payer: Self-pay

## 2023-12-10 NOTE — Progress Notes (Signed)
Specialty Pharmacy Refill Coordination Note  Ricardo [name] is a 62 y.o. male contacted today regarding refills of specialty medication(s) Emtricitabine-Tenofovir AF (Descovy); Etravirine (Intelence)   Patient requested Delivery   Delivery date: 12/20/23   Verified address: [address] [place], [address]   Medication will be filled on 12.27.24.

## 2024-01-10 ENCOUNTER — Other Ambulatory Visit: Payer: Self-pay | Admitting: Infectious Disease

## 2024-01-10 ENCOUNTER — Telehealth: Payer: Self-pay

## 2024-01-10 NOTE — Telephone Encounter (Signed)
Patient reached out to office for Fluconazole. States that he has concerns for Thrush and would like antibiotics called into pharmacy.  Pt not able to come in for appt due to work schedule. Pt is a long haul driver. Pt message below  I'll be in [place]. That's why I said antibiotics, green outta my nose and hocking same after I get up in the morning. I've gotten antibiotics on the road before, in the beginning. That's what I need.  I'll be back first week of March, I must work til then.  I drive [place] to [employer]. He knows me pretty well about everything, primarily an upper respiratory infection, my throat will be stretched again to help swallow of food. (Radiation damage)   If he writes one it still has to be the liquid he wrote for me during Chemo/Radiation. I still can't swallow a pill. A [facility] pharmacy is what I'm using when I find one.  Semi parking better."  Juanita Laster, RMA

## 2024-01-10 NOTE — Telephone Encounter (Signed)
Per pt no white coat and "Yes but with the condition my throat is in it's a double whammy, antibiotics would clear it up til [place] got [place]. Dr Algis Liming and Melvyn Neth are the only Drs I trust. They both saved my life. "

## 2024-01-11 ENCOUNTER — Ambulatory Visit: Payer: BC Managed Care – PPO | Admitting: Infectious Disease

## 2024-01-11 ENCOUNTER — Other Ambulatory Visit (HOSPITAL_COMMUNITY): Payer: Self-pay

## 2024-01-11 ENCOUNTER — Other Ambulatory Visit: Payer: Self-pay | Admitting: Infectious Disease

## 2024-01-11 ENCOUNTER — Other Ambulatory Visit (HOSPITAL_COMMUNITY): Payer: Self-pay | Admitting: Pharmacy Technician

## 2024-01-11 MED ORDER — AZITHROMYCIN 200 MG/5ML PO SUSR: 500.0000 mg | Freq: Every day | ORAL | 0 refills | Status: AC

## 2024-01-11 NOTE — Telephone Encounter (Signed)
Patient called back. States that symptoms have been going on for a few days. Is c/o congestion, green mucus, red/ sore throat. Has not tested for covid/flu.  States that he is not around people and does not believe he has either.  Would like medication sent to [facility] pharmacy in [place].  Is not able to swallow pills. Would like either liquid or something he can crush.   [facility] Address: [address], [place], [place] [address] Phone: [phone]

## 2024-01-11 NOTE — Progress Notes (Signed)
Specialty Pharmacy Refill Coordination Note  Ricardo [name] is a 63 y.o. male contacted today regarding refills of specialty medication(s) Emtricitabine-Tenofovir AF (Descovy); Etravirine (Intelence)   Patient requested Delivery   Delivery date: 01/17/24   Verified address: Patient address [address]  [place]   Medication will be filled on 01/14/24.

## 2024-01-11 NOTE — Telephone Encounter (Signed)
Spoke with pt. No questions at this time. Juanita Laster, RMA

## 2024-01-11 NOTE — Telephone Encounter (Signed)
Pt states he  can take Cephalosporins as long as he  can crush pill.

## 2024-01-14 ENCOUNTER — Other Ambulatory Visit: Payer: Self-pay

## 2024-02-11 ENCOUNTER — Encounter: Payer: Self-pay | Admitting: Infectious Disease

## 2024-02-11 ENCOUNTER — Other Ambulatory Visit: Payer: Self-pay

## 2024-02-11 NOTE — Progress Notes (Signed)
Specialty Pharmacy Refill Coordination Note  Ricardo [name] is a 63 y.o. male contacted today regarding refills of specialty medication(s) Emtricitabine-Tenofovir AF (Descovy); Etravirine (Intelence)   Patient requested Delivery   Delivery date: 02/17/24   Verified address: Patient address [address]  [place]   Medication will be filled on 02/16/24.

## 2024-02-16 ENCOUNTER — Other Ambulatory Visit: Payer: Self-pay

## 2024-02-18 ENCOUNTER — Ambulatory Visit: Payer: BC Managed Care – PPO | Admitting: Oncology

## 2024-02-18 ENCOUNTER — Other Ambulatory Visit: Payer: BC Managed Care – PPO

## 2024-02-20 NOTE — Progress Notes (Unsigned)
 [facility]  [address] [place],  [place]  [address] [phone]  Clinic Day:  02/21/2024  Referring physician: Kelby Aline, MD  HISTORY OF PRESENT ILLNESS:  The patient is a 63 y.o. 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 He completed all of his definitve chemoradiation in October 2022, which consisted of 3 cycles of cisplatin.  A PET scan done afterwards showed a complete response to his chemoradiation.  He comes in today for routine follow-up.  Since his last visit, patient has been doing okay.  The major issue which continues to impact his daily quality of life is suboptimal swallowing.  He did have esophageal stretching per an EGD done at [facility] in 2024, from which he did notice an improvement.  However, he is feeling esophageal tightness again to where he believes this procedure needs to be repeated, hopefully locally.  Otherwise, he denies having any symptoms which concern him for disease recurrence.   PHYSICAL EXAM:  Blood pressure (!) 146/85, pulse 74, temperature 98.4 F (36.9 C), temperature source Oral, resp. rate 16, height 5\' 10"  (1.778 m), weight 195 lb (88.5 kg), SpO2 98%. Wt Readings from Last 3 Encounters:  02/21/24 195 lb (88.5 kg)  08/19/23 202 lb (91.6 kg)  08/19/23 201 lb 12.8 oz (91.5 kg)   Body mass index is 27.98 kg/m. Performance status (ECOG): 1 - Symptomatic but completely ambulatory Physical Exam Constitutional:      Appearance: Normal appearance. He is not ill-appearing.  HENT:     Head:     Comments: Chronic neck tautness from previous radiation    Mouth/Throat:     Mouth: Mucous membranes are moist.     Pharynx: Oropharynx is clear. No oropharyngeal exudate or posterior oropharyngeal erythema.  Cardiovascular:     Rate and Rhythm: Normal rate and regular rhythm.     Heart sounds: No murmur heard.    No friction rub. No gallop.  Pulmonary:     Effort: Pulmonary effort is  normal. No respiratory distress.     Breath sounds: Normal breath sounds. No wheezing, rhonchi or rales.  Abdominal:     General: Bowel sounds are normal. There is no distension.     Palpations: Abdomen is soft. There is no mass.     Tenderness: There is no abdominal tenderness.  Musculoskeletal:        General: No swelling.     Right lower leg: No edema.     Left lower leg: No edema.  Lymphadenopathy:     Cervical: No cervical adenopathy.     Upper Body:     Right upper body: No supraclavicular or axillary adenopathy.     Left upper body: No supraclavicular or axillary adenopathy.     Lower Body: No right inguinal adenopathy. No left inguinal adenopathy.  Skin:    General: Skin is warm.     Coloration: Skin is not jaundiced.     Findings: No lesion or rash.  Neurological:     General: No focal deficit present.     Mental Status: He is alert and oriented to person, place, and time. Mental status is at baseline.  Psychiatric:        Mood and Affect: Mood normal.        Behavior: Behavior normal.        Thought Content: Thought content normal.    LABS:      Latest Ref Rng & Units 02/21/2024  11:12 AM 08/19/2023   11:04 AM 08/19/2023   12:00 AM  CBC  WBC 4.0 - 10.5 K/uL 3.4  3.3  3.4      Hemoglobin 13.0 - 17.0 g/dL 78.2  95.6  21.3      Hematocrit 39.0 - 52.0 % 39.8  40.3  37      Platelets 150 - 400 K/uL 228  236  218         This result is from an external source.      Latest Ref Rng & Units 02/21/2024   11:12 AM 08/19/2023   11:04 AM 08/19/2023    8:24 AM  CMP  Glucose 70 - 99 mg/dL 086  578  97   BUN 8 - 23 mg/dL 16  15  16    Creatinine 0.61 - 1.24 mg/dL 4.69  6.29  5.28   Sodium 135 - 145 mmol/L 138  141  142   Potassium 3.5 - 5.1 mmol/L 4.4  4.2  3.9   Chloride 98 - 111 mmol/L 102  105  106   CO2 22 - 32 mmol/L 26  28  27    Calcium 8.9 - 10.3 mg/dL 9.4  9.6  9.1   Total Protein 6.5 - 8.1 g/dL 7.3  7.5  7.6   Total Bilirubin 0.0 - 1.2 mg/dL 0.3  0.4  0.3    Alkaline Phos 38 - 126 U/L 69   51   AST 15 - 41 U/L 19  18  21    ALT 0 - 44 U/L 11  13  18     ASSESSMENT & PLAN:  A 63 y.o. 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  Based upon his exam today, the patient remains disease-free.  Clinically, he appears to be doing fairly well.  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  I also want him to see ENT so they may do a repeat laryngoscopy to ensure there is no visual evidence of early disease recurrence.  Per his physical exam today, this does not appear to be the case.  Overall, the patient is doing very well.  I do feel comfortable seeing him back in another 6 months for repeat clinical assessment. The patient understands all the plans discussed today and is in agreement with them.    Glynda Soliday Kirby Funk, MD

## 2024-02-21 ENCOUNTER — Other Ambulatory Visit: Payer: Self-pay

## 2024-02-21 ENCOUNTER — Inpatient Hospital Stay (HOSPITAL_BASED_OUTPATIENT_CLINIC_OR_DEPARTMENT_OTHER): Payer: BC Managed Care – PPO | Admitting: Oncology

## 2024-02-21 ENCOUNTER — Other Ambulatory Visit (HOSPITAL_COMMUNITY)
Admission: RE | Admit: 2024-02-21 | Discharge: 2024-02-21 | Disposition: A | Discharge status: Home / Self Care | Source: Ambulatory Visit | Attending: Infectious Disease | Admitting: Infectious Disease

## 2024-02-21 ENCOUNTER — Encounter: Payer: Self-pay | Admitting: Infectious Disease

## 2024-02-21 ENCOUNTER — Inpatient Hospital Stay: Payer: BC Managed Care – PPO | Attending: Oncology

## 2024-02-21 ENCOUNTER — Ambulatory Visit (INDEPENDENT_AMBULATORY_CARE_PROVIDER_SITE_OTHER): Payer: BC Managed Care – PPO | Admitting: Infectious Disease

## 2024-02-21 VITALS — BP 146/85 | HR 74 | Temp 98.4°F | Resp 16 | Ht 70.0 in | Wt 195.0 lb

## 2024-02-21 VITALS — BP 125/80 | HR 78 | Temp 98.0°F | Ht 72.0 in | Wt 196.0 lb

## 2024-02-21 DIAGNOSIS — C01 Malignant neoplasm of base of tongue: Secondary | ICD-10-CM | POA: Insufficient documentation

## 2024-02-21 DIAGNOSIS — E785 Hyperlipidemia, unspecified: Secondary | ICD-10-CM

## 2024-02-21 DIAGNOSIS — Z79899 Other long term (current) drug therapy: Secondary | ICD-10-CM | POA: Insufficient documentation

## 2024-02-21 DIAGNOSIS — Z9221 Personal history of antineoplastic chemotherapy: Secondary | ICD-10-CM | POA: Diagnosis not present

## 2024-02-21 DIAGNOSIS — R1319 Other dysphagia: Secondary | ICD-10-CM

## 2024-02-21 DIAGNOSIS — Z923 Personal history of irradiation: Secondary | ICD-10-CM | POA: Diagnosis not present

## 2024-02-21 DIAGNOSIS — B20 Human immunodeficiency virus [HIV] disease: Secondary | ICD-10-CM

## 2024-02-21 DIAGNOSIS — Z85810 Personal history of malignant neoplasm of tongue: Secondary | ICD-10-CM | POA: Insufficient documentation

## 2024-02-21 LAB — TSH: TSH: 2.941 u[IU]/mL (ref 0.350–4.500)

## 2024-02-21 LAB — CMP (CANCER CENTER ONLY)
ALT: 11 U/L (ref 0–44)
AST: 19 U/L (ref 15–41)
Albumin: 4.3 g/dL (ref 3.5–5.0)
Alkaline Phosphatase: 69 U/L (ref 38–126)
Anion gap: 10 (ref 5–15)
BUN: 16 mg/dL (ref 8–23)
CO2: 26 mmol/L (ref 22–32)
Calcium: 9.4 mg/dL (ref 8.9–10.3)
Chloride: 102 mmol/L (ref 98–111)
Creatinine: 1.29 mg/dL — ABNORMAL HIGH (ref 0.61–1.24)
GFR, Estimated: >60 mL/min (ref >60)
Glucose, Bld: 158 mg/dL — ABNORMAL HIGH (ref 70–99)
Potassium: 4.4 mmol/L (ref 3.5–5.1)
Sodium: 138 mmol/L (ref 135–145)
Total Bilirubin: 0.3 mg/dL (ref 0.0–1.2)
Total Protein: 7.3 g/dL (ref 6.5–8.1)

## 2024-02-21 LAB — CBC WITH DIFFERENTIAL (CANCER CENTER ONLY)
Abs Immature Granulocytes: 0.01 10*3/uL (ref 0.00–0.07)
Basophils Absolute: 0.1 10*3/uL (ref 0.0–0.1)
Basophils Relative: 2 %
Eosinophils Absolute: 0.1 10*3/uL (ref 0.0–0.5)
Eosinophils Relative: 2 %
HCT: 39.8 % (ref 39.0–52.0)
Hemoglobin: 13.9 g/dL (ref 13.0–17.0)
Immature Granulocytes: 0 %
Lymphocytes Relative: 42 %
Lymphs Abs: 1.4 10*3/uL (ref 0.7–4.0)
MCH: 30.8 pg (ref 26.0–34.0)
MCHC: 34.9 g/dL (ref 30.0–36.0)
MCV: 88.2 fL (ref 80.0–100.0)
Monocytes Absolute: 0.3 10*3/uL (ref 0.1–1.0)
Monocytes Relative: 9 %
Neutro Abs: 1.6 10*3/uL — ABNORMAL LOW (ref 1.7–7.7)
Neutrophils Relative %: 45 %
Platelet Count: 228 10*3/uL (ref 150–400)
RBC: 4.51 MIL/uL (ref 4.22–5.81)
RDW: 12.7 % (ref 11.5–15.5)
WBC Count: 3.4 10*3/uL — ABNORMAL LOW (ref 4.0–10.5)
nRBC: 0 % (ref 0.0–0.2)
nRBC: 0 /100{WBCs}

## 2024-02-21 NOTE — Progress Notes (Signed)
 Subjective:   Chief complaint: follow-up for HIV disease on medications    Patient ID: Ricardo [name], male    DOB: 1961-10-21, 63 y.o.   MRN: [id number]  HPI   Discussed the use of AI scribe software for clinical note transcription with the patient, who gave verbal consent to proceed.  History of Present Illness   The patient, with a known history of HIV and neck cancer, presents for a routine follow-up. He reports that his daughter recently discovered his HIV status, leading to family discussions and stress. He is currently on antiretroviral therapy and is adherent to his medication regimen. He also discusses his history of neck cancer and the effects of radiation therapy, including difficulty swallowing. He expresses interest in long-acting injectable antiretroviral therapy to avoid the potential for his medication being discovered by others. He also reports that he is finished with radiation therapy for his neck cancer, but is experiencing difficulty swallowing, which he attributes to the effects of the radiation.       Past Medical History:  Diagnosis Date   Abnormal liver function tests 11/16/2016   Abnormal thyroid exam 05/06/2022   Diabetes mellitus type 2 in obese 11/20/2015   Diabetes mellitus without complication (HCC)    Diabetes type 2, uncontrolled    HIV (human immunodeficiency virus infection) (HCC)    Hyperlipidemia    Hyperlipidemia 02/18/2023   LVH (left ventricular hypertrophy)    Obesity 05/08/2019   Palpitations    Rash and nonspecific skin eruption 09/15/2016   Thyroid mass 08/14/2021    No past surgical history on file.  Family History  Problem Relation Age of Onset   Coronary artery disease Father 43      Social History   Socioeconomic History   Marital status: Married    Spouse name: Not on file   Number of children: Not on file   Years of education: Not on file   Highest education level: Not on file  Occupational History   Not  on file  Tobacco Use   Smoking status: Never   Smokeless tobacco: Never  Substance and Sexual Activity   Alcohol use: No   Drug use: No   Sexual activity: Yes    Partners: Female    Comment: pt. declined condoms  Other Topics Concern   Not on file  Social History Narrative   Not on file   Social Drivers of Health   Financial Resource Strain: Not on file  Food Insecurity: Not on file  Transportation Needs: Not on file  Physical Activity: Not on file  Stress: Not on file  Social Connections: Not on file    Allergies  Allergen Reactions   Amoxicillin-Pot Clavulanate    Amoxicillin-Pot Clavulanate      Current Outpatient Medications:    Blood Glucose Monitoring Suppl (ONE TOUCH ULTRA 2) W/DEVICE KIT, Use to check blood sugars 1/day., Disp: 1 each, Rfl: 0   emtricitabine-tenofovir AF (DESCOVY) 200-25 MG tablet, TAKE 1 TABLET BY MOUTH DAILY., Disp: 30 tablet, Rfl: 11   glucose blood (ONE TOUCH ULTRA TEST) test strip, Use to check blood sugars three times daily., Disp: 100 each, Rfl: 11   INTELENCE 200 MG TABS, Take 2 tablets (400 mg total) by mouth daily., Disp: 60 tablet, Rfl: 11   omeprazole (PRILOSEC) 40 MG capsule, Take 1 capsule (40 mg total) by mouth every morning 30 mintes before breakfast. Open capsule and sprinkle on applesauce, Disp: 30 capsule, Rfl: 3   pravastatin (PRAVACHOL)  40 MG tablet, Take 1 tablet (40 mg total) by mouth daily. Crush or dissolve in liquid if trouble swallowing, Disp: 30 tablet, Rfl: 5   Past Medical History:  Diagnosis Date   Abnormal liver function tests 11/16/2016   Abnormal thyroid exam 05/06/2022   Diabetes mellitus type 2 in obese 11/20/2015   Diabetes mellitus without complication (HCC)    Diabetes type 2, uncontrolled    HIV (human immunodeficiency virus infection) (HCC)    Hyperlipidemia    Hyperlipidemia 02/18/2023   LVH (left ventricular hypertrophy)    Obesity 05/08/2019   Palpitations    Rash and nonspecific skin eruption  09/15/2016   Thyroid mass 08/14/2021    No past surgical history on file.  Family History  Problem Relation Age of Onset   Coronary artery disease Father 9      Social History   Socioeconomic History   Marital status: Married    Spouse name: Not on file   Number of children: Not on file   Years of education: Not on file   Highest education level: Not on file  Occupational History   Not on file  Tobacco Use   Smoking status: Never   Smokeless tobacco: Never  Substance and Sexual Activity   Alcohol use: No   Drug use: No   Sexual activity: Yes    Partners: Female    Comment: pt. declined condoms  Other Topics Concern   Not on file  Social History Narrative   Not on file   Social Drivers of Health   Financial Resource Strain: Not on file  Food Insecurity: Not on file  Transportation Needs: Not on file  Physical Activity: Not on file  Stress: Not on file  Social Connections: Not on file    Allergies  Allergen Reactions   Amoxicillin-Pot Clavulanate    Amoxicillin-Pot Clavulanate      Current Outpatient Medications:    Blood Glucose Monitoring Suppl (ONE TOUCH ULTRA 2) W/DEVICE KIT, Use to check blood sugars 1/day., Disp: 1 each, Rfl: 0   emtricitabine-tenofovir AF (DESCOVY) 200-25 MG tablet, TAKE 1 TABLET BY MOUTH DAILY., Disp: 30 tablet, Rfl: 11   glucose blood (ONE TOUCH ULTRA TEST) test strip, Use to check blood sugars three times daily., Disp: 100 each, Rfl: 11   INTELENCE 200 MG TABS, Take 2 tablets (400 mg total) by mouth daily., Disp: 60 tablet, Rfl: 11   omeprazole (PRILOSEC) 40 MG capsule, Take 1 capsule (40 mg total) by mouth every morning 30 mintes before breakfast. Open capsule and sprinkle on applesauce, Disp: 30 capsule, Rfl: 3   pravastatin (PRAVACHOL) 40 MG tablet, Take 1 tablet (40 mg total) by mouth daily. Crush or dissolve in liquid if trouble swallowing, Disp: 30 tablet, Rfl: 5   Review of Systems  Constitutional:  Negative for activity  change, appetite change, chills, diaphoresis, fatigue, fever and unexpected weight change.  HENT:  Negative for congestion, rhinorrhea, sinus pressure, sneezing, sore throat and trouble swallowing.   Eyes:  Negative for photophobia and visual disturbance.  Respiratory:  Negative for cough, chest tightness, shortness of breath, wheezing and stridor.   Cardiovascular:  Negative for chest pain, palpitations and leg swelling.  Gastrointestinal:  Negative for abdominal distention, abdominal pain, anal bleeding, blood in stool, constipation, diarrhea, nausea and vomiting.  Genitourinary:  Negative for difficulty urinating, dysuria, flank pain and hematuria.  Musculoskeletal:  Negative for arthralgias, back pain, gait problem, joint swelling and myalgias.  Skin:  Negative for color change,  pallor, rash and wound.  Neurological:  Negative for dizziness, tremors, weakness and light-headedness.  Hematological:  Negative for adenopathy. Does not bruise/bleed easily.  Psychiatric/Behavioral:  Negative for agitation, behavioral problems, confusion, decreased concentration, dysphoric mood and sleep disturbance.        Objective:   Physical Exam Constitutional:      Appearance: He is well-developed.  HENT:     Head: Normocephalic and atraumatic.  Eyes:     Conjunctiva/sclera: Conjunctivae normal.  Cardiovascular:     Rate and Rhythm: Normal rate and regular rhythm.  Pulmonary:     Effort: Pulmonary effort is normal. No respiratory distress.     Breath sounds: No wheezing.  Abdominal:     General: There is no distension.     Palpations: Abdomen is soft.  Musculoskeletal:        General: No tenderness. Normal range of motion.     Cervical back: Normal range of motion and neck supple.  Skin:    General: Skin is warm and dry.     Coloration: Skin is not pale.     Findings: No erythema or rash.  Neurological:     General: No focal deficit present.     Mental Status: He is alert and oriented to  person, place, and time.  Psychiatric:        Mood and Affect: Mood normal.        Behavior: Behavior normal.        Thought Content: Thought content normal.        Judgment: Judgment normal.           Assessment & Plan:   Assessment and Plan    HIV infection HIV well-managed with Descovy and Intelence. Addressed concerns about stigma and potential discovery by daughter. Reassured about life expectancy with adherence. Discussed new treatment options, including long-acting therapies, but current daily therapies are more reliable. Crushing medications acceptable. Noted 1% resistance risk with injectables like Cabenuva. - Continue Descovy and Intelence. - Reassured about life expectancy with adherence.  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 Completed radiation therapy October 2022, no cancer recurrence. Prefers esophageal dilation before ENT evaluation. - Proceed with esophageal dilation as planned by oncologist. - he will also undergo  ENT evaluation to ensure no recurrence  Follow-up Follow-up with infectious disease specialist in six months to monitor HIV management and esophageal condition. Encouraged to bring daughter for support and education. - Schedule follow-up in six months. - Encourage bringing daughter for support and education.      NOTE I AM THE ID physician he will be following up with. The AI scribe made a mistake.

## 2024-02-22 LAB — T-HELPER CELLS (CD4) COUNT (NOT AT ARMC)
CD4 % Helper T Cell: 29 % — ABNORMAL LOW (ref 33–65)
CD4 T Cell Abs: 486 /uL (ref 400–1790)

## 2024-02-22 LAB — URINE CYTOLOGY ANCILLARY ONLY
Chlamydia: NEGATIVE
Comment: NEGATIVE
Comment: NORMAL
Neisseria Gonorrhea: NEGATIVE

## 2024-02-23 LAB — LIPID PANEL
Cholesterol: 282 mg/dL — ABNORMAL HIGH (ref <200)
HDL: 34 mg/dL — ABNORMAL LOW (ref >=40)
Non-HDL Cholesterol (Calc): 248 mg/dL — ABNORMAL HIGH (ref <130)
Total CHOL/HDL Ratio: 8.3 (calc) — ABNORMAL HIGH (ref <5.0)
Triglycerides: 746 mg/dL — ABNORMAL HIGH (ref <150)

## 2024-02-23 LAB — RPR: RPR Ser Ql: NONREACTIVE

## 2024-02-23 LAB — HIV-1 RNA QUANT-NO REFLEX-BLD
HIV 1 RNA Quant: NOT DETECTED {copies}/mL
HIV-1 RNA Quant, Log: NOT DETECTED {Log_copies}/mL

## 2024-03-10 ENCOUNTER — Other Ambulatory Visit: Payer: Self-pay

## 2024-03-10 NOTE — Progress Notes (Signed)
 Specialty Pharmacy Refill Coordination Note  Ricardo [name] is a 63 y.o. male contacted today regarding refills of specialty medication(s) Emtricitabine-Tenofovir AF (Descovy); Etravirine (Intelence)   Patient requested Delivery   Delivery date: 03/16/24   Verified address: Patient address [address]  [place]   Medication will be filled on 03/15/24.

## 2024-03-15 ENCOUNTER — Other Ambulatory Visit: Payer: Self-pay

## 2024-04-04 ENCOUNTER — Other Ambulatory Visit: Payer: Self-pay

## 2024-04-07 ENCOUNTER — Other Ambulatory Visit: Payer: Self-pay

## 2024-04-07 ENCOUNTER — Other Ambulatory Visit: Payer: Self-pay | Admitting: Pharmacy Technician

## 2024-04-07 NOTE — Progress Notes (Signed)
 Specialty Pharmacy Refill Coordination Note  Ricardo [name] is a 63 y.o. male contacted today regarding refills of specialty medication(s) Emtricitabine -Tenofovir  AF (Descovy ); Etravirine  (Intelence )   Patient requested Delivery   Delivery date: 04/13/24   Verified address: [address]  [place]   Medication will be filled on 04/12/24.

## 2024-04-12 ENCOUNTER — Other Ambulatory Visit: Payer: Self-pay

## 2024-05-02 ENCOUNTER — Other Ambulatory Visit (HOSPITAL_COMMUNITY): Payer: Self-pay

## 2024-05-02 NOTE — Progress Notes (Signed)
 Specialty Pharmacy Refill Coordination Note  Ricardo [name] is a 63 y.o. male contacted today regarding refills of specialty medication(s) Emtricitabine -Tenofovir  AF (Descovy ); Etravirine  (Intelence )   Patient requested Delivery   Delivery date: 05/11/24   Verified address: [address]  [place]   Medication will be filled on 05/10/24.

## 2024-05-02 NOTE — Progress Notes (Signed)
 Specialty Pharmacy Ongoing Clinical Assessment Note  Ricardo [name] is a 63 y.o. male who is being followed by the specialty pharmacy service for RxSp HIV   Patient's specialty medication(s) reviewed today: Emtricitabine -Tenofovir  AF (Descovy ); Etravirine  (Intelence )   Missed doses in the last 4 weeks: 0   Patient/Caregiver did not have any additional questions or concerns.   Therapeutic benefit summary: Patient is achieving benefit   Adverse events/side effects summary: No adverse events/side effects   Patient's therapy is appropriate to: Continue    Goals Addressed             This Visit's Progress    Achieve Undetectable HIV Viral Load < 20   On track    Patient is on track. Patient will maintain adherence. Viral load remains undetectable long term.          Follow up: 6 months  Malachi Screws Specialty Pharmacist

## 2024-05-08 NOTE — Progress Notes (Signed)
 The 10-year ASCVD risk score (Arnett DK, et al., 2019) is: 30.7%   Values used to calculate the score:     Age: 63 years     Sex: Male     Is Non-Hispanic African American: No     Diabetic: Yes     Tobacco smoker: No     Systolic Blood Pressure: 125 mmHg     Is BP treated: No     HDL Cholesterol: 34 mg/dL     Total Cholesterol: 282 mg/dL  Currently prescribed pravastatin  40 mg.   Ricardo [name], BSN, RN

## 2024-05-10 ENCOUNTER — Other Ambulatory Visit: Payer: Self-pay

## 2024-06-01 ENCOUNTER — Other Ambulatory Visit: Payer: Self-pay

## 2024-06-05 ENCOUNTER — Other Ambulatory Visit: Payer: Self-pay

## 2024-06-05 ENCOUNTER — Other Ambulatory Visit: Payer: Self-pay | Admitting: Pharmacy Technician

## 2024-06-05 NOTE — Progress Notes (Signed)
 Specialty Pharmacy Refill Coordination Note  Ricardo [name] is a 63 y.o. male contacted today regarding refills of specialty medication(s) Emtricitabine -Tenofovir  AF (Descovy ); Etravirine  (Intelence )   Patient requested Delivery   Delivery date: 06/07/24   Verified address: [address] [place] [address]   Medication will be filled on 06/07/24.

## 2024-06-07 ENCOUNTER — Other Ambulatory Visit: Payer: Self-pay

## 2024-06-09 ENCOUNTER — Other Ambulatory Visit: Payer: Self-pay

## 2024-06-11 ENCOUNTER — Other Ambulatory Visit: Payer: Self-pay

## 2024-06-11 ENCOUNTER — Emergency Department (HOSPITAL_COMMUNITY)

## 2024-06-11 ENCOUNTER — Emergency Department (HOSPITAL_COMMUNITY)
Admission: EM | Admit: 2024-06-11 | Discharge: 2024-06-11 | Disposition: A | Discharge status: Home / Self Care | Attending: Emergency Medicine | Admitting: Emergency Medicine

## 2024-06-11 DIAGNOSIS — Z85828 Personal history of other malignant neoplasm of skin: Secondary | ICD-10-CM | POA: Diagnosis not present

## 2024-06-11 DIAGNOSIS — Z21 Asymptomatic human immunodeficiency virus [HIV] infection status: Secondary | ICD-10-CM | POA: Insufficient documentation

## 2024-06-11 DIAGNOSIS — J209 Acute bronchitis, unspecified: Secondary | ICD-10-CM | POA: Diagnosis not present

## 2024-06-11 DIAGNOSIS — R0789 Other chest pain: Secondary | ICD-10-CM | POA: Diagnosis present

## 2024-06-11 DIAGNOSIS — R059 Cough, unspecified: Secondary | ICD-10-CM | POA: Insufficient documentation

## 2024-06-11 LAB — TROPONIN I (HIGH SENSITIVITY)
Troponin I (High Sensitivity): 5 ng/L (ref <18)
Troponin I (High Sensitivity): 5 ng/L (ref <18)

## 2024-06-11 LAB — RESP PANEL BY RT-PCR (RSV, FLU A&B, COVID)  RVPGX2
Influenza A by PCR: NEGATIVE
Influenza B by PCR: NEGATIVE
Resp Syncytial Virus by PCR: NEGATIVE
SARS Coronavirus 2 by RT PCR: NEGATIVE

## 2024-06-11 LAB — BASIC METABOLIC PANEL WITH GFR
Anion gap: 14 (ref 5–15)
BUN: 19 mg/dL (ref 8–23)
CO2: 23 mmol/L (ref 22–32)
Calcium: 9.1 mg/dL (ref 8.9–10.3)
Chloride: 99 mmol/L (ref 98–111)
Creatinine, Ser: 1.53 mg/dL — ABNORMAL HIGH (ref 0.61–1.24)
GFR, Estimated: 51 mL/min — ABNORMAL LOW (ref >60)
Glucose, Bld: 122 mg/dL — ABNORMAL HIGH (ref 70–99)
Potassium: 3.8 mmol/L (ref 3.5–5.1)
Sodium: 136 mmol/L (ref 135–145)

## 2024-06-11 LAB — CBC
HCT: 42.7 % (ref 39.0–52.0)
Hemoglobin: 14.7 g/dL (ref 13.0–17.0)
MCH: 31.5 pg (ref 26.0–34.0)
MCHC: 34.4 g/dL (ref 30.0–36.0)
MCV: 91.4 fL (ref 80.0–100.0)
Platelets: 240 10*3/uL (ref 150–400)
RBC: 4.67 MIL/uL (ref 4.22–5.81)
RDW: 12.8 % (ref 11.5–15.5)
WBC: 5.5 10*3/uL (ref 4.0–10.5)
nRBC: 0 % (ref 0.0–0.2)

## 2024-06-11 MED ORDER — AZITHROMYCIN 250 MG PO TABS
500.0000 mg | ORAL_TABLET | Freq: Once | ORAL | Status: DC
  Filled 2024-06-11: qty 2

## 2024-06-11 MED ORDER — AZITHROMYCIN 250 MG PO TABS: 250.0000 mg | ORAL_TABLET | Freq: Every day | ORAL | 0 refills | Status: DC

## 2024-06-11 MED ORDER — IOHEXOL 350 MG/ML SOLN
75.0000 mL | Freq: Once | INTRAVENOUS | Status: AC | PRN
  Administered 2024-06-11: 75 mL via INTRAVENOUS

## 2024-06-11 MED ORDER — AZITHROMYCIN 200 MG/5ML PO SUSR
500.0000 mg | Freq: Once | ORAL | Status: AC
  Administered 2024-06-11: 500 mg via ORAL
  Filled 2024-06-11: qty 12.5
  Filled 2024-06-11: qty 15

## 2024-06-11 MED ORDER — AZITHROMYCIN 200 MG/5ML PO SUSR: 250.0000 mg | Freq: Every day | ORAL | 0 refills | Status: AC

## 2024-06-11 MED ORDER — LACTATED RINGERS IV BOLUS
1000.0000 mL | Freq: Once | INTRAVENOUS | Status: AC
  Administered 2024-06-11: 1000 mL via INTRAVENOUS

## 2024-06-11 NOTE — ED Provider Triage Note (Signed)
 Emergency Medicine Provider Triage Evaluation Note  Ricardo [name] , a 64 y.o. male  was evaluated in triage.  Pt complains of increasing shortness of breath of the last 5 days.  He has previous had radiation therapy for oropharyngeal cancer and has strictures in the upper airway.  As result is difficult for him to expectorate however he has been able to produce thick green sputum.  He has exertional dyspnea and states that this has been getting progressively worse over the last 5 days.  Notable previous medical history of type 2 diabetes, prostatitis, LVH, hypertriglyceridemia, oropharyngeal cancer.  Review of Systems  Positive: Cough, dyspnea, generalized weakness and fatigue Negative: Nausea/vomiting.  Physical Exam  BP 123/78 (BP Location: Right Arm)   Pulse 98   Temp 99.4 F (37.4 C)   Resp 16   Wt 88 kg   SpO2 99%   BMI 26.31 kg/m  Gen:   Awake, no distress   Resp:  Normal effort good air entry into all lung fields without adventitious sounds noted. MSK:   Moves extremities without difficulty  Other:    Medical Decision Making  Medically screening exam initiated at 12:52 PM.  Appropriate orders placed.  Ricardo [name] was informed that the remainder of the evaluation will be completed by another provider, this initial triage assessment does not replace that evaluation, and the importance of remaining in the ED until their evaluation is complete.  Based on evaluation of this patient, suspect symptoms are secondary to upper respiratory infection, possible bronchitis or pneumonia.  Chest imaging has been obtained and lab workup obtained as well as respiratory panel.   Ricardo [name], Ricardo [name] 06/11/24 1255

## 2024-06-11 NOTE — Discharge Instructions (Signed)
 You are seen in the emergency department for cough and chest pain.  You had blood work EKG chest x-ray and a CAT scan of your chest along with a CAT scan of your neck.  There is no evidence of blood clot or pneumonia.  Great treated with antibiotics for possible bronchitis.  Please follow-up with your primary care doctor for further evaluation.  Return to the emergency department if any worsening or concerning symptoms.

## 2024-06-11 NOTE — ED Provider Notes (Signed)
 Signout from Dr. Jakie.  63 year old male HIV tongue cancer here with increased cough and shortness of breath, fatigue.  Elevated temperature on arrival.  COVID and flu negative, normal white count.  Getting CT angio chest and CT soft tissue neck.  Disposition per results of testing. Physical Exam  BP 118/76   Pulse 96   Temp 99.4 F (37.4 C)   Resp 20   Wt 88 kg   SpO2 98%   BMI 26.31 kg/m   Physical Exam  Procedures  Procedures  ED Course / MDM   Clinical Course as of 06/11/24 1532  Sun Jun 11, 2024  1522 Signed out to Dr Towana [RP]    Clinical Course User Index [RP] Yolande Lamar BROCKS, MD   Medical Decision Making Amount and/or Complexity of Data Reviewed Labs: ordered. Radiology: ordered.  Risk Prescription drug management.   Patient CT angio does not show any acute findings.  CT soft tissue neck shows some thickening likely postradiation, no abscess.  Reviewed with patient.  Will cover with antibiotics for possible bronchitis.  Recommended close follow-up with his treatment team.  Return instructions discussed.       Towana Ozell BROCKS, MD 06/12/24 1050

## 2024-06-11 NOTE — ED Notes (Signed)
 Extra SST & Lav drawn

## 2024-06-11 NOTE — ED Provider Notes (Signed)
 [place] EMERGENCY DEPARTMENT AT [facility] Provider Note   CSN: [id number] Arrival date & time: 06/11/24  1141     Patient presents with: Shortness of Breath and Chest Pain   Ricardo [name] is a 63 y.o. male.  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  Patient reports that for the past 5 days he has had a cough.  Has been productive.  Also has had generalized fatigue.  States he had a fever of 101F prior to arrival.  Also is reporting some pleuritic chest pain in the middle of his chest that started several days ago as well.  No leg swelling.  No history of DVT or PE and is not on blood thinners.       Prior to Admission medications   Medication Sig Start Date End Date Taking? Authorizing Provider  Blood Glucose Monitoring Suppl (ONE TOUCH ULTRA 2) W/DEVICE KIT Use to check blood sugars 1/day. 03/02/14   Kassie Mallick, MD  emtricitabine -tenofovir  AF (DESCOVY ) 200-25 MG tablet TAKE 1 TABLET BY MOUTH DAILY. 08/19/23 08/18/24  Fleeta Kathie Jomarie LOISE, MD  glucose blood (ONE TOUCH ULTRA TEST) test strip Use to check blood sugars three times daily. 01/30/19   Fleeta Kathie Jomarie LOISE, MD  INTELENCE  200 MG TABS Take 2 tablets (400 mg total) by mouth daily. 08/19/23   Fleeta Kathie Jomarie LOISE, MD  omeprazole  (PRILOSEC) 40 MG capsule Take 1 capsule (40 mg total) by mouth every morning 30 mintes before breakfast. Open capsule and sprinkle on applesauce 01/27/23     pravastatin  (PRAVACHOL ) 40 MG tablet Take 1 tablet (40 mg total) by mouth daily. Crush or dissolve in liquid if trouble swallowing 02/23/23   Waddell Alan PARAS, RPH-CPP    Allergies: Amoxicillin-pot clavulanate and Amoxicillin-pot clavulanate    Review of Systems  Updated Vital Signs BP 123/78 (BP Location:  Right Arm)   Pulse 98   Temp 99.4 F (37.4 C)   Resp 16   Wt 88 kg   SpO2 99%   BMI 26.31 kg/m   Physical Exam Vitals and nursing note reviewed.  Constitutional:      General: He is not in acute distress.    Appearance: He is well-developed.  HENT:     Head: Normocephalic and atraumatic.     Right Ear: External ear normal.     Left Ear: External ear normal.     Nose: Nose normal.     Mouth/Throat:     Mouth: Mucous membranes are moist.     Pharynx: Oropharynx is clear.   Eyes:     Extraocular Movements: Extraocular movements intact.     Conjunctiva/sclera: Conjunctivae normal.     Pupils: Pupils are equal, round, and reactive to light.    Cardiovascular:     Rate and Rhythm: Normal rate and regular rhythm.     Heart sounds: Normal heart sounds.  Pulmonary:     Effort: Pulmonary effort is normal. No respiratory distress.     Breath sounds: Normal breath sounds.   Musculoskeletal:     Cervical back: Normal range of motion and neck supple.     Right lower leg: No edema.     Left lower leg: No edema.   Skin:    General: Skin is warm and dry.  Neurological:     Mental Status: He is alert. Mental status is at baseline.   Psychiatric:        Mood and Affect: Mood normal.        Behavior: Behavior normal.     (all labs ordered are listed, but only abnormal results are displayed) Labs Reviewed  BASIC METABOLIC PANEL WITH GFR - Abnormal; Notable for the following components:      Result Value   Glucose, Bld 122 (*)    Creatinine, Ser 1.53 (*)    GFR, Estimated 51 (*)    All other components within normal limits  RESP PANEL BY RT-PCR (RSV, FLU A&B, COVID)  RVPGX2  CBC  TROPONIN I (HIGH SENSITIVITY)  TROPONIN I (HIGH SENSITIVITY)    EKG: None  Radiology: DG Chest 2 View Result Date: 06/11/2024 CLINICAL DATA:  Fever and shortness of breath with 5 days of cough EXAM: CHEST - 2 VIEW COMPARISON:  Chest radiograph dated 03/26/2008 FINDINGS: Normal lung  volumes. No focal consolidations. No pleural effusion or pneumothorax. The heart size and mediastinal contours are within normal limits. Cervical spinal fixation hardware appears intact. IMPRESSION: No active cardiopulmonary disease. Electronically Signed   By: Limin  Xu M.D.   On: 06/11/2024 13:10    {Document cardiac monitor, telemetry assessment procedure when appropriate:32947} Procedures   Medications Ordered in the ED - No data to display    {Click here for ABCD2, HEART and other calculators REFRESH Note before signing:1}                              Medical Decision Making Amount and/or Complexity of Data Reviewed Labs: ordered. Radiology: ordered.   ***  {Document critical care time when appropriate  Document review of labs and clinical decision tools ie [id number], etc  Document your independent review of radiology images and any outside records  Document your discussion with family members, caretakers and with consultants  Document social determinants of health affecting pt's care  Document your decision making why or why not admission, treatments were needed:32947:::1}   Final diagnoses:  None    ED Discharge Orders     None

## 2024-06-11 NOTE — ED Triage Notes (Signed)
 PT present with SOB, cough x 5 days. Pt states previously had radiation on throat and has a hard time trying to cough anything up. Pt states SOB worse with walking. Chest pain with coughing and when trying to take deep breath

## 2024-06-27 ENCOUNTER — Encounter (HOSPITAL_COMMUNITY): Payer: Self-pay

## 2024-06-27 ENCOUNTER — Other Ambulatory Visit (HOSPITAL_COMMUNITY): Payer: Self-pay

## 2024-06-29 ENCOUNTER — Other Ambulatory Visit (HOSPITAL_COMMUNITY): Payer: Self-pay

## 2024-06-29 ENCOUNTER — Other Ambulatory Visit: Payer: Self-pay

## 2024-06-29 NOTE — Progress Notes (Signed)
 Specialty Pharmacy Refill Coordination Note  Ricardo [name] is a 63 y.o. male contacted today regarding refills of specialty medication(s) Emtricitabine -Tenofovir  AF (Descovy ); Etravirine  (Intelence )   Patient requested Delivery   Delivery date: 07/05/24   Verified address: [address] [place] [address]   Medication will be filled on 07/04/24.

## 2024-06-30 ENCOUNTER — Telehealth: Payer: Self-pay

## 2024-06-30 NOTE — Telephone Encounter (Signed)
 Called Dr. Lara office, spoke to Katheryn Penton, she told me that there is no openings until September.  Sent her Dr. Ezzard' last note to her e-mail where it mentioned he needed to see GI.  She said she would show it to Dr. Larene on Monday 07/03/2024.

## 2024-07-03 ENCOUNTER — Telehealth: Payer: Self-pay

## 2024-07-03 NOTE — Telephone Encounter (Signed)
 Spoke with [name] gave him appointment for Dr. Larene for July 17, 2024 @ 3:10.

## 2024-07-27 ENCOUNTER — Other Ambulatory Visit (HOSPITAL_BASED_OUTPATIENT_CLINIC_OR_DEPARTMENT_OTHER): Payer: Self-pay

## 2024-07-31 ENCOUNTER — Other Ambulatory Visit: Payer: Self-pay | Admitting: Infectious Disease

## 2024-07-31 ENCOUNTER — Other Ambulatory Visit: Payer: Self-pay

## 2024-07-31 DIAGNOSIS — B20 Human immunodeficiency virus [HIV] disease: Secondary | ICD-10-CM

## 2024-07-31 MED ORDER — INTELENCE 200 MG PO TABS
400.0000 mg | ORAL_TABLET | Freq: Every day | ORAL | 1 refills | Status: DC
  Filled 2024-07-31 – 2024-08-04 (×2): qty 60, 30d supply, fill #0
  Filled 2024-09-01 – 2024-10-18 (×2): qty 60, 30d supply, fill #1

## 2024-07-31 MED ORDER — DESCOVY 200-25 MG PO TABS
1.0000 | ORAL_TABLET | Freq: Every day | ORAL | 1 refills | Status: DC
Start: 2024-07-31 — End: 2024-10-18
  Filled 2024-07-31 – 2024-08-04 (×2): qty 30, 30d supply, fill #0
  Filled 2024-09-01 – 2024-10-18 (×2): qty 30, 30d supply, fill #1

## 2024-08-02 ENCOUNTER — Other Ambulatory Visit: Payer: Self-pay

## 2024-08-04 ENCOUNTER — Other Ambulatory Visit: Payer: Self-pay

## 2024-08-04 NOTE — Progress Notes (Signed)
 Specialty Pharmacy Refill Coordination Note  Ricardo [name] is a 63 y.o. male contacted today regarding refills of specialty medication(s) Emtricitabine-Tenofovir AF (Descovy); Etravirine (Intelence)   Patient requested Delivery   Delivery date: 08/08/24   Verified address: [address] [place] [address]   Medication will be filled on 08/07/24.

## 2024-08-07 ENCOUNTER — Other Ambulatory Visit: Payer: Self-pay

## 2024-08-23 ENCOUNTER — Ambulatory Visit: Admitting: Oncology

## 2024-08-23 ENCOUNTER — Ambulatory Visit: Admitting: Infectious Disease

## 2024-08-23 ENCOUNTER — Other Ambulatory Visit

## 2024-09-01 ENCOUNTER — Other Ambulatory Visit: Payer: Self-pay | Admitting: Pharmacy Technician

## 2024-09-01 ENCOUNTER — Other Ambulatory Visit: Payer: Self-pay

## 2024-09-01 ENCOUNTER — Other Ambulatory Visit (HOSPITAL_COMMUNITY): Payer: Self-pay

## 2024-09-04 ENCOUNTER — Other Ambulatory Visit: Payer: Self-pay

## 2024-09-04 ENCOUNTER — Other Ambulatory Visit (HOSPITAL_COMMUNITY): Payer: Self-pay

## 2024-09-05 ENCOUNTER — Other Ambulatory Visit: Payer: Self-pay

## 2024-09-19 ENCOUNTER — Other Ambulatory Visit

## 2024-09-19 ENCOUNTER — Ambulatory Visit: Admitting: Oncology

## 2024-09-19 ENCOUNTER — Ambulatory Visit: Payer: Self-pay | Admitting: Infectious Disease

## 2024-10-10 ENCOUNTER — Other Ambulatory Visit (HOSPITAL_COMMUNITY): Payer: Self-pay

## 2024-10-16 ENCOUNTER — Other Ambulatory Visit (HOSPITAL_COMMUNITY): Payer: Self-pay

## 2024-10-18 ENCOUNTER — Other Ambulatory Visit: Payer: Self-pay

## 2024-10-18 ENCOUNTER — Other Ambulatory Visit (HOSPITAL_COMMUNITY): Payer: Self-pay

## 2024-10-18 DIAGNOSIS — B20 Human immunodeficiency virus [HIV] disease: Secondary | ICD-10-CM

## 2024-10-18 MED ORDER — DESCOVY 200-25 MG PO TABS
1.0000 | ORAL_TABLET | Freq: Every day | ORAL | 0 refills | Status: DC
  Filled 2024-10-24: qty 30, 30d supply, fill #0

## 2024-10-18 MED ORDER — INTELENCE 200 MG PO TABS
400.0000 mg | ORAL_TABLET | Freq: Every day | ORAL | 0 refills | Status: DC
  Filled 2024-10-24: qty 60, 30d supply, fill #0

## 2024-10-19 ENCOUNTER — Other Ambulatory Visit (HOSPITAL_COMMUNITY): Payer: Self-pay

## 2024-10-20 ENCOUNTER — Other Ambulatory Visit (HOSPITAL_COMMUNITY): Payer: Self-pay

## 2024-10-23 ENCOUNTER — Other Ambulatory Visit (HOSPITAL_COMMUNITY): Payer: Self-pay

## 2024-10-24 ENCOUNTER — Other Ambulatory Visit (HOSPITAL_COMMUNITY): Payer: Self-pay

## 2024-10-24 ENCOUNTER — Other Ambulatory Visit: Payer: Self-pay

## 2024-10-24 ENCOUNTER — Telehealth: Payer: Self-pay

## 2024-10-24 NOTE — Progress Notes (Signed)
 Specialty Pharmacy Refill Coordination Note  Ricardo [name] is a 63 y.o. male assessed today regarding refills of clinic administered specialty medication(s) Emtricitabine -Tenofovir  AF (Descovy ); Etravirine  (Intelence )   Clinic requested Delivery   Delivery date: 10/25/24   Verified address: [address] [place] [address]   Medication will be filled on 10/24/24.

## 2024-10-24 NOTE — Telephone Encounter (Signed)
 Copay card   Intelence     Descovy  Copay Card

## 2024-10-25 ENCOUNTER — Other Ambulatory Visit: Payer: Self-pay

## 2024-10-26 ENCOUNTER — Other Ambulatory Visit: Payer: Self-pay

## 2024-10-29 NOTE — Progress Notes (Unsigned)
 [facility]  [address] [place],  [place]  [address] [phone]  Clinic Day:  10/29/2024  Referring physician: Devere Georgia, MD  HISTORY OF PRESENT ILLNESS:  The patient is a 63 y.o. 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 He completed all of his definitve chemoradiation in October 2022, which consisted of 3 cycles of cisplatin .  A PET scan done afterwards showed a complete response to his chemoradiation.  He comes in today for routine follow-up.  Since his last visit, patient has been doing okay.  The major issue which continues to impact his daily quality of life is suboptimal swallowing.  He did have esophageal stretching per an EGD done at [facility] in 2024, from which he did notice an improvement.  However, he is feeling esophageal tightness again to where he believes this procedure needs to be repeated, hopefully locally.  Otherwise, he denies having any symptoms which concern him for disease recurrence.   PHYSICAL EXAM:  There were no vitals taken for this visit. Wt Readings from Last 3 Encounters:  06/11/24 194 lb 0.1 oz (88 kg)  02/21/24 196 lb (88.9 kg)  02/21/24 195 lb (88.5 kg)   There is no height or weight on file to calculate BMI. Performance status (ECOG): 1 - Symptomatic but completely ambulatory Physical Exam Constitutional:      Appearance: Normal appearance. He is not ill-appearing.  HENT:     Head:     Comments: Chronic neck tautness from previous radiation    Mouth/Throat:     Mouth: Mucous membranes are moist.     Pharynx: Oropharynx is clear. No oropharyngeal exudate or posterior oropharyngeal erythema.  Cardiovascular:     Rate and Rhythm: Normal rate and regular rhythm.     Heart sounds: No murmur heard.    No friction rub. No gallop.  Pulmonary:     Effort: Pulmonary effort is normal. No respiratory distress.     Breath sounds: Normal breath sounds. No wheezing, rhonchi or  rales.  Abdominal:     General: Bowel sounds are normal. There is no distension.     Palpations: Abdomen is soft. There is no mass.     Tenderness: There is no abdominal tenderness.  Musculoskeletal:        General: No swelling.     Right lower leg: No edema.     Left lower leg: No edema.  Lymphadenopathy:     Cervical: No cervical adenopathy.     Upper Body:     Right upper body: No supraclavicular or axillary adenopathy.     Left upper body: No supraclavicular or axillary adenopathy.     Lower Body: No right inguinal adenopathy. No left inguinal adenopathy.  Skin:    General: Skin is warm.     Coloration: Skin is not jaundiced.     Findings: No lesion or rash.  Neurological:     General: No focal deficit present.     Mental Status: He is alert and oriented to person, place, and time. Mental status is at baseline.  Psychiatric:        Mood and Affect: Mood normal.        Behavior: Behavior normal.        Thought Content: Thought content normal.    LABS:      Latest Ref Rng & Units 06/11/2024   11:51 AM 02/21/2024   11:12 AM 08/19/2023   11:04 AM  CBC  WBC 4.0 - 10.5 K/uL 5.5  3.4  3.3   Hemoglobin 13.0 - 17.0 g/dL 85.2  86.0  86.3   Hematocrit 39.0 - 52.0 % 42.7  39.8  40.3   Platelets 150 - 400 K/uL 240  228  236       Latest Ref Rng & Units 06/11/2024   11:51 AM 02/21/2024   11:12 AM 08/19/2023   11:04 AM  CMP  Glucose 70 - 99 mg/dL 877  841  892   BUN 8 - 23 mg/dL 19  16  15    Creatinine 0.61 - 1.24 mg/dL 8.46  8.70  8.82   Sodium 135 - 145 mmol/L 136  138  141   Potassium 3.5 - 5.1 mmol/L 3.8  4.4  4.2   Chloride 98 - 111 mmol/L 99  102  105   CO2 22 - 32 mmol/L 23  26  28    Calcium  8.9 - 10.3 mg/dL 9.1  9.4  9.6   Total Protein 6.5 - 8.1 g/dL  7.3  7.5   Total Bilirubin 0.0 - 1.2 mg/dL  0.3  0.4   Alkaline Phos 38 - 126 U/L  69    AST 15 - 41 U/L  19  18   ALT 0 - 44 U/L  11  13    ASSESSMENT & PLAN:  A 63 y.o. male with stage I (T2 N1 M0) HPV+ base of  tongue squamous cell carcinoma, status post chemoradiation that was completed in October 2022.  Based upon his exam today, the patient remains disease-free.  Clinically, he appears to be doing fairly well.  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  I also want him to see ENT so they may do a repeat laryngoscopy to ensure there is no visual evidence of early disease recurrence.  Per his physical exam today, this does not appear to be the case.  Overall, the patient is doing very well.  I do feel comfortable seeing him back in another 6 months for repeat clinical assessment. The patient understands all the plans discussed today and is in agreement with them.    Ceniya Fowers DELENA Kerns, MD

## 2024-10-30 ENCOUNTER — Inpatient Hospital Stay: Attending: Oncology

## 2024-10-30 ENCOUNTER — Inpatient Hospital Stay (HOSPITAL_BASED_OUTPATIENT_CLINIC_OR_DEPARTMENT_OTHER): Admitting: Oncology

## 2024-10-30 ENCOUNTER — Other Ambulatory Visit: Payer: Self-pay | Admitting: Oncology

## 2024-10-30 VITALS — BP 124/80 | HR 76 | Temp 98.1°F | Resp 16 | Ht 72.0 in | Wt 198.1 lb

## 2024-10-30 DIAGNOSIS — C01 Malignant neoplasm of base of tongue: Secondary | ICD-10-CM

## 2024-10-30 DIAGNOSIS — Z85810 Personal history of malignant neoplasm of tongue: Secondary | ICD-10-CM | POA: Insufficient documentation

## 2024-10-30 DIAGNOSIS — Z923 Personal history of irradiation: Secondary | ICD-10-CM | POA: Insufficient documentation

## 2024-10-30 DIAGNOSIS — R0789 Other chest pain: Secondary | ICD-10-CM | POA: Insufficient documentation

## 2024-10-30 DIAGNOSIS — Z9221 Personal history of antineoplastic chemotherapy: Secondary | ICD-10-CM | POA: Diagnosis not present

## 2024-10-30 LAB — CBC WITH DIFFERENTIAL (CANCER CENTER ONLY)
Abs Immature Granulocytes: 0.01 K/uL (ref 0.00–0.07)
Basophils Absolute: 0 K/uL (ref 0.0–0.1)
Basophils Relative: 1 %
Eosinophils Absolute: 0.1 K/uL (ref 0.0–0.5)
Eosinophils Relative: 2 %
HCT: 39.8 % (ref 39.0–52.0)
Hemoglobin: 13.7 g/dL (ref 13.0–17.0)
Immature Granulocytes: 0 %
Lymphocytes Relative: 37 %
Lymphs Abs: 1.4 K/uL (ref 0.7–4.0)
MCH: 30.9 pg (ref 26.0–34.0)
MCHC: 34.4 g/dL (ref 30.0–36.0)
MCV: 89.6 fL (ref 80.0–100.0)
Monocytes Absolute: 0.4 K/uL (ref 0.1–1.0)
Monocytes Relative: 9 %
Neutro Abs: 2 K/uL (ref 1.7–7.7)
Neutrophils Relative %: 51 %
Platelet Count: 222 K/uL (ref 150–400)
RBC: 4.44 MIL/uL (ref 4.22–5.81)
RDW: 12.5 % (ref 11.5–15.5)
WBC Count: 3.9 K/uL — ABNORMAL LOW (ref 4.0–10.5)
nRBC: 0 % (ref 0.0–0.2)

## 2024-10-30 LAB — CMP (CANCER CENTER ONLY)
ALT: 15 U/L (ref 0–44)
AST: 22 U/L (ref 15–41)
Albumin: 4.5 g/dL (ref 3.5–5.0)
Alkaline Phosphatase: 64 U/L (ref 38–126)
Anion gap: 9 (ref 5–15)
BUN: 16 mg/dL (ref 8–23)
CO2: 27 mmol/L (ref 22–32)
Calcium: 9.4 mg/dL (ref 8.9–10.3)
Chloride: 100 mmol/L (ref 98–111)
Creatinine: 1.29 mg/dL — ABNORMAL HIGH (ref 0.61–1.24)
GFR, Estimated: >60 mL/min (ref >60)
Glucose, Bld: 178 mg/dL — ABNORMAL HIGH (ref 70–99)
Potassium: 4.2 mmol/L (ref 3.5–5.1)
Sodium: 136 mmol/L (ref 135–145)
Total Bilirubin: 0.4 mg/dL (ref 0.0–1.2)
Total Protein: 7.5 g/dL (ref 6.5–8.1)

## 2024-10-30 LAB — TSH: TSH: 3.18 u[IU]/mL (ref 0.350–4.500)

## 2024-11-13 ENCOUNTER — Ambulatory Visit (INDEPENDENT_AMBULATORY_CARE_PROVIDER_SITE_OTHER)
Admission: RE | Admit: 2024-11-13 | Discharge: 2024-11-13 | Disposition: A | Discharge status: Home / Self Care | Source: Ambulatory Visit | Attending: Oncology | Admitting: Oncology

## 2024-11-13 DIAGNOSIS — C01 Malignant neoplasm of base of tongue: Secondary | ICD-10-CM

## 2024-11-13 MED ORDER — IOHEXOL 300 MG/ML  SOLN
100.0000 mL | Freq: Once | INTRAMUSCULAR | Status: AC | PRN
  Administered 2024-11-13: 75 mL via INTRAVENOUS

## 2024-11-13 NOTE — Progress Notes (Unsigned)
 [facility] -  [address] [place],  [place]  [address] [phone]   Clinic Day:  11/14/2024  Referring physician: Devere Georgia, MD  I connected with  Ricardo [name] on 11/14/24 by a  telemedicine application and verified that I am speaking with the correct person using two identifiers.   I discussed the limitations of evaluation and management by telemedicine. The patient expressed understanding and agreed to proceed.   HISTORY OF PRESENT ILLNESS:  The patient is a 63 y.o. 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 He completed all of his definitve chemoradiation in October 2022, which consisted of 3 cycles of cisplatin .  A PET scan done afterwards showed a complete response to his chemoradiation.  He comes in today for routine follow-up.  Since his last visit, patient has been doing okay.  The major issue which continues to impact his daily quality of life is suboptimal swallowing.  He also complains of a burning sensation in the middle of his chest.  Of note, the patient did have both recent EGD and a colonoscopy done recently, for which no abnormal GI tract pathology was seen.  He is apparently scheduled for an esophageal dilation procedure imminently.    PHYSICAL EXAM:  There were no vitals taken for this visit. Wt Readings from Last 3 Encounters:  10/30/24 198 lb 1.6 oz (89.9 kg)  06/11/24 194 lb 0.1 oz (88 kg)  02/21/24 196 lb (88.9 kg)   There is no height or weight on file to calculate BMI. Performance status (ECOG): 1 - Symptomatic but completely ambulatory Physical Exam Constitutional:      Appearance: Normal appearance. He is not ill-appearing.  HENT:     Head:     Comments: Chronic neck tautness from previous radiation    Mouth/Throat:     Mouth: Mucous membranes are moist.     Pharynx: Oropharynx is clear. No oropharyngeal exudate or posterior oropharyngeal erythema.  Cardiovascular:     Rate and  Rhythm: Normal rate and regular rhythm.     Heart sounds: No murmur heard.    No friction rub. No gallop.  Pulmonary:     Effort: Pulmonary effort is normal. No respiratory distress.     Breath sounds: Normal breath sounds. No wheezing, rhonchi or rales.  Abdominal:     General: Bowel sounds are normal. There is no distension.     Palpations: Abdomen is soft. There is no mass.     Tenderness: There is no abdominal tenderness.  Musculoskeletal:        General: No swelling.     Right lower leg: No edema.     Left lower leg: No edema.  Lymphadenopathy:     Cervical: No cervical adenopathy.     Upper Body:     Right upper body: No supraclavicular or axillary adenopathy.     Left upper body: No supraclavicular or axillary adenopathy.     Lower Body: No right inguinal adenopathy. No left inguinal adenopathy.  Skin:    General: Skin is warm.     Coloration: Skin is not jaundiced.     Findings: No lesion or rash.  Neurological:     General: No focal deficit present.     Mental Status: He is alert and oriented to person, place, and time. Mental status is at baseline.  Psychiatric:        Mood and Affect: Mood normal.  Behavior: Behavior normal.        Thought Content: Thought content normal.    LABS:      Latest Ref Rng & Units 10/30/2024    9:11 AM 06/11/2024   11:51 AM 02/21/2024   11:12 AM  CBC  WBC 4.0 - 10.5 K/uL 3.9  5.5  3.4   Hemoglobin 13.0 - 17.0 g/dL 86.2  85.2  86.0   Hematocrit 39.0 - 52.0 % 39.8  42.7  39.8   Platelets 150 - 400 K/uL 222  240  228     Latest Reference Range & Units 10/30/24 09:11  Neutrophils % 51  Lymphocytes % 37  Monocytes Relative % 9  Eosinophil % 2  Basophil % 1  Immature Granulocytes % 0  NEUT# 1.7 - 7.7 K/uL 2.0      Latest Ref Rng & Units 10/30/2024    9:11 AM 06/11/2024   11:51 AM 02/21/2024   11:12 AM  CMP  Glucose 70 - 99 mg/dL 821  877  841   BUN 8 - 23 mg/dL 16  19  16    Creatinine 0.61 - 1.24 mg/dL 8.70  8.46  8.70    Sodium 135 - 145 mmol/L 136  136  138   Potassium 3.5 - 5.1 mmol/L 4.2  3.8  4.4   Chloride 98 - 111 mmol/L 100  99  102   CO2 22 - 32 mmol/L 27  23  26    Calcium  8.9 - 10.3 mg/dL 9.4  9.1  9.4   Total Protein 6.5 - 8.1 g/dL 7.5   7.3   Total Bilirubin 0.0 - 1.2 mg/dL 0.4   0.3   Alkaline Phos 38 - 126 U/L 64   69   AST 15 - 41 U/L 22   19   ALT 0 - 44 U/L 15   11   CLINICAL DATA:  Tongue cancer. * Tracking Code: BO * new onset epigastric pain.   EXAM: CT CHEST WITH CONTRAST   TECHNIQUE: Multidetector CT imaging of the chest was performed during intravenous contrast administration.   RADIATION DOSE REDUCTION: This exam was performed according to the departmental dose-optimization program which includes automated exposure control, adjustment of the mA and/or kV according to patient size and/or use of iterative reconstruction technique.   CONTRAST:  75mL OMNIPAQUE  IOHEXOL  300 MG/ML  SOLN   COMPARISON:  06/11/2024.   FINDINGS: Cardiovascular: Atherosclerotic calcification of the aorta. Heart size normal. No pericardial effusion.   Mediastinum/Nodes: No pathologically enlarged mediastinal, hilar or axillary lymph nodes. Similar small. Similar small periesophageal and lower periaortic lymph nodes, measuring up to 8 mm to the right of the lower descending thoracic aorta. There may be mild distal esophageal wall thickening.   Lungs/Pleura: No suspicious pulmonary nodules. No pleural fluid. Airway is unremarkable.   Upper Abdomen: Visualized portions of the liver, gallbladder, adrenal glands, kidneys, spleen, pancreas, stomach and bowel are grossly unremarkable. No upper abdominal adenopathy.   Musculoskeletal: Degenerative changes in the spine. Sclerosis in the medial right clavicle and adjacent manubrium, present on 06/11/2024 but new from PET 01/12/2022.   IMPRESSION: 1. No definitive evidence of metastatic disease. 2. Sclerosis in the medial right clavicle and  adjacent manubrium, present on 06/11/2024 and new from PET 01/12/2022. A degenerative and/or posttraumatic etiology is favored. Please correlate clinically. 3.  Aortic atherosclerosis (ICD10-I70.0).     Electronically Signed   By: Newell Eke M.D.   On: 11/13/2024 11:32 ASSESSMENT & PLAN:  A 63  y.o. 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 Recent CT imaging reveals IMPRESSION: 1. No definitive evidence of metastatic disease. 2. Sclerosis in the medial right clavicle and adjacent manubrium, present on 06/11/2024 and new from PET 01/12/2022. A degenerative and/or posttraumatic etiology is favored. Please correlate clinically. 3.  Aortic atherosclerosis (ICD10-I70.0).October 2022. Results reviewed with patient and will follow up with Dr. Ezzard accordingly.   The patient understands all the plans discussed today and is in agreement with them.    Eleanor Bach, FNP- BC

## 2024-11-14 ENCOUNTER — Inpatient Hospital Stay: Admitting: Hematology and Oncology

## 2024-11-14 NOTE — Progress Notes (Unsigned)
   Subjective:  Chief complaint: follow-up for HIV disease on medications   Patient ID: Ricardo [name], male    DOB: 08/16/1961, 63 y.o.   MRN: [id number]  HPI  Past Medical History:  Diagnosis Date   Abnormal liver function tests 11/16/2016   Abnormal thyroid  exam 05/06/2022   Diabetes mellitus type 2 in obese 11/20/2015   Diabetes mellitus without complication (HCC)    Diabetes type 2, uncontrolled    HIV (human immunodeficiency virus infection) (HCC)    Hyperlipidemia    Hyperlipidemia 02/18/2023   LVH (left ventricular hypertrophy)    Obesity 05/08/2019   Palpitations    Rash and nonspecific skin eruption 09/15/2016   Thyroid  mass 08/14/2021    No past surgical history on file.  Family History  Problem Relation Age of Onset   Coronary artery disease Father 38      Social History   Socioeconomic History   Marital status: Married    Spouse name: Not on file   Number of children: Not on file   Years of education: Not on file   Highest education level: Not on file  Occupational History   Not on file  Tobacco Use   Smoking status: Never   Smokeless tobacco: Never  Substance and Sexual Activity   Alcohol use: No   Drug use: No   Sexual activity: Yes    Partners: Female    Comment: pt. declined condoms  Other Topics Concern   Not on file  Social History Narrative   Not on file   Social Drivers of Health   Financial Resource Strain: Not on file  Food Insecurity: Not on file  Transportation Needs: Not on file  Physical Activity: Not on file  Stress: Not on file  Social Connections: Not on file    Allergies  Allergen Reactions   Amoxicillin-Pot Clavulanate    Amoxicillin-Pot Clavulanate      Current Outpatient Medications:    Blood Glucose Monitoring Suppl (ONE TOUCH ULTRA 2) W/DEVICE KIT, Use to check blood sugars 1/day., Disp: 1 each, Rfl: 0   emtricitabine -tenofovir  AF (DESCOVY ) 200-25 MG tablet, TAKE 1 TABLET BY MOUTH DAILY., Disp: 30  tablet, Rfl: 0   glucose blood (ONE TOUCH ULTRA TEST) test strip, Use to check blood sugars three times daily., Disp: 100 each, Rfl: 11   INTELENCE  200 MG TABS, Take 2 tablets (400 mg total) by mouth daily., Disp: 60 tablet, Rfl: 0   omeprazole  (PRILOSEC) 40 MG capsule, Take 1 capsule (40 mg total) by mouth every morning 30 mintes before breakfast. Open capsule and sprinkle on applesauce, Disp: 30 capsule, Rfl: 3   pravastatin  (PRAVACHOL ) 40 MG tablet, Take 1 tablet (40 mg total) by mouth daily. Crush or dissolve in liquid if trouble swallowing, Disp: 30 tablet, Rfl: 5    Review of Systems     Objective:   Physical Exam        Assessment & Plan:

## 2024-11-15 ENCOUNTER — Other Ambulatory Visit: Payer: Self-pay

## 2024-11-15 ENCOUNTER — Telehealth (HOSPITAL_COMMUNITY): Payer: Self-pay

## 2024-11-15 ENCOUNTER — Other Ambulatory Visit (HOSPITAL_COMMUNITY): Payer: Self-pay

## 2024-11-15 ENCOUNTER — Ambulatory Visit: Payer: Self-pay | Admitting: Infectious Disease

## 2024-11-15 ENCOUNTER — Encounter: Payer: Self-pay | Admitting: Infectious Disease

## 2024-11-15 VITALS — BP 120/81 | HR 73 | Temp 97.7°F | Ht 72.0 in | Wt 199.0 lb

## 2024-11-15 DIAGNOSIS — C01 Malignant neoplasm of base of tongue: Secondary | ICD-10-CM

## 2024-11-15 DIAGNOSIS — R1013 Epigastric pain: Secondary | ICD-10-CM

## 2024-11-15 DIAGNOSIS — Z79899 Other long term (current) drug therapy: Secondary | ICD-10-CM

## 2024-11-15 DIAGNOSIS — I700 Atherosclerosis of aorta: Secondary | ICD-10-CM

## 2024-11-15 DIAGNOSIS — Z23 Encounter for immunization: Secondary | ICD-10-CM | POA: Diagnosis not present

## 2024-11-15 DIAGNOSIS — K222 Esophageal obstruction: Secondary | ICD-10-CM

## 2024-11-15 DIAGNOSIS — E785 Hyperlipidemia, unspecified: Secondary | ICD-10-CM

## 2024-11-15 DIAGNOSIS — B20 Human immunodeficiency virus [HIV] disease: Secondary | ICD-10-CM

## 2024-11-15 DIAGNOSIS — Z113 Encounter for screening for infections with a predominantly sexual mode of transmission: Secondary | ICD-10-CM

## 2024-11-15 MED ORDER — DESCOVY 200-25 MG PO TABS
1.0000 | ORAL_TABLET | Freq: Every day | ORAL | 11 refills | Status: AC
  Filled 2024-11-15 – 2024-11-17 (×2): qty 30, 30d supply, fill #0
  Filled 2024-12-13 – 2024-12-19 (×4): qty 30, 30d supply, fill #1
  Filled 2025-01-15: qty 30, 30d supply, fill #2

## 2024-11-15 MED ORDER — ATORVASTATIN CALCIUM 20 MG/5ML PO SUSP
20.0000 mg | Freq: Every day | ORAL | 11 refills | Status: DC
  Filled 2024-11-15: qty 200, 100d supply, fill #0
  Filled 2024-11-20: qty 150, 30d supply, fill #0

## 2024-11-15 MED ORDER — INTELENCE 200 MG PO TABS
400.0000 mg | ORAL_TABLET | Freq: Every day | ORAL | 11 refills | Status: DC
  Filled 2024-11-15 – 2024-11-17 (×2): qty 60, 30d supply, fill #0
  Filled 2024-12-13 – 2024-12-18 (×2): qty 60, 30d supply, fill #1

## 2024-11-17 ENCOUNTER — Other Ambulatory Visit: Payer: Self-pay

## 2024-11-17 ENCOUNTER — Other Ambulatory Visit (HOSPITAL_COMMUNITY): Payer: Self-pay

## 2024-11-20 ENCOUNTER — Other Ambulatory Visit (HOSPITAL_COMMUNITY): Payer: Self-pay

## 2024-11-20 ENCOUNTER — Telehealth: Payer: Self-pay

## 2024-11-20 DIAGNOSIS — E785 Hyperlipidemia, unspecified: Secondary | ICD-10-CM

## 2024-11-20 NOTE — Telephone Encounter (Signed)
 Pharmacy Patient Advocate Encounter   Received notification from [employer] that prior authorization for ATORVALIQ  SOLUTION is required/requested.   Insurance verification completed.   The patient is insured through [facility].   Per test claim: PA required; PA submitted to above mentioned insurance via [employer] Key/confirmation #/EOC [id number] Status is pending

## 2024-11-20 NOTE — Telephone Encounter (Signed)
 Pharmacy Patient Advocate Encounter  Received notification from [name] that Prior Authorization for ATORVALIQ  SUS has been DENIED.  No reason given; No denial letter received via Fax or CMM. It has been requested and will be uploaded to the media tab once received.   PA #/Case ID/Reference #: [id number]

## 2024-11-21 ENCOUNTER — Other Ambulatory Visit (HOSPITAL_COMMUNITY): Payer: Self-pay

## 2024-11-21 ENCOUNTER — Other Ambulatory Visit: Payer: Self-pay

## 2024-11-21 ENCOUNTER — Encounter: Payer: Self-pay | Admitting: Pharmacist

## 2024-11-21 LAB — CBC WITH DIFFERENTIAL/PLATELET
Absolute Lymphocytes: 1406 {cells}/uL (ref 850–3900)
Absolute Monocytes: 417 {cells}/uL (ref 200–950)
Basophils Absolute: 39 {cells}/uL (ref 0–200)
Basophils Relative: 0.9 %
Eosinophils Absolute: 90 {cells}/uL (ref 15–500)
Eosinophils Relative: 2.1 %
HCT: 41.3 % (ref 39.4–51.1)
Hemoglobin: 14.2 g/dL (ref 13.2–17.1)
MCH: 31.6 pg (ref 27.0–33.0)
MCHC: 34.4 g/dL (ref 31.6–35.4)
MCV: 92 fL (ref 81.4–101.7)
MPV: 9.7 fL (ref 7.5–12.5)
Monocytes Relative: 9.7 %
Neutro Abs: 2348 {cells}/uL (ref 1500–7800)
Neutrophils Relative %: 54.6 %
Platelets: 265 Thousand/uL (ref 140–400)
RBC: 4.49 Million/uL (ref 4.20–5.80)
RDW: 12.9 % (ref 11.0–15.0)
Total Lymphocyte: 32.7 %
WBC: 4.3 Thousand/uL (ref 3.8–10.8)

## 2024-11-21 LAB — COMPLETE METABOLIC PANEL WITHOUT GFR
AG Ratio: 1.8 (calc) (ref 1.0–2.5)
ALT: 12 U/L (ref 9–46)
AST: 21 U/L (ref 10–35)
Albumin: 4.7 g/dL (ref 3.6–5.1)
Alkaline phosphatase (APISO): 53 U/L (ref 35–144)
BUN/Creatinine Ratio: 19 (calc) (ref 6–22)
BUN: 26 mg/dL — ABNORMAL HIGH (ref 7–25)
CO2: 28 mmol/L (ref 20–32)
Calcium: 9.8 mg/dL (ref 8.6–10.3)
Chloride: 100 mmol/L (ref 98–110)
Creat: 1.35 mg/dL (ref 0.70–1.35)
Globulin: 2.6 g/dL (ref 1.9–3.7)
Glucose, Bld: 108 mg/dL — ABNORMAL HIGH (ref 65–99)
Potassium: 4.2 mmol/L (ref 3.5–5.3)
Sodium: 137 mmol/L (ref 135–146)
Total Bilirubin: 0.4 mg/dL (ref 0.2–1.2)
Total Protein: 7.3 g/dL (ref 6.1–8.1)

## 2024-11-21 LAB — T-HELPER CELLS (CD4) COUNT (NOT AT ARMC)
Absolute CD4: 415 {cells}/uL — ABNORMAL LOW (ref 490–1740)
CD4 T Helper %: 29 % — ABNORMAL LOW (ref 30–61)
Total lymphocyte count: 1452 {cells}/uL (ref 850–3900)

## 2024-11-21 LAB — HIV-1 RNA QUANT-NO REFLEX-BLD
HIV 1 RNA Quant: NOT DETECTED {copies}/mL
HIV-1 RNA Quant, Log: NOT DETECTED {Log_copies}/mL

## 2024-11-21 LAB — LIPID PANEL
Cholesterol: 289 mg/dL — ABNORMAL HIGH (ref <200)
HDL: 36 mg/dL — ABNORMAL LOW (ref >=40)
Non-HDL Cholesterol (Calc): 253 mg/dL — ABNORMAL HIGH (ref <130)
Total CHOL/HDL Ratio: 8 (calc) — ABNORMAL HIGH (ref <5.0)
Triglycerides: 983 mg/dL — ABNORMAL HIGH (ref <150)

## 2024-11-21 LAB — SYPHILIS: RPR W/REFLEX TO RPR TITER AND TREPONEMAL ANTIBODIES, TRADITIONAL SCREENING AND DIAGNOSIS ALGORITHM: RPR Ser Ql: NONREACTIVE

## 2024-11-21 MED ORDER — ATORVASTATIN CALCIUM 20 MG PO TABS
20.0000 mg | ORAL_TABLET | Freq: Every day | ORAL | 11 refills | Status: AC
  Filled 2024-11-21: qty 30, 30d supply, fill #0
  Filled 2024-12-22 (×2): qty 30, 30d supply, fill #1
  Filled 2025-01-15: qty 30, 30d supply, fill #2

## 2024-11-21 NOTE — Telephone Encounter (Signed)
 Confirmed that atorvastatin  tablets can be crushed and dispersed in 10 mL of purified water or small amount of liquid food (like apple sauce or yogurt, etc). Since he is already doing this with Tivicay  and Intelence , I figure he should be able to do the same with atorvastatin . I am happy to speak with him about this and resend 20 mg tablet rx if you agree.  Alan Geralds, PharmD, CPP, BCIDP, AAHIVP Clinical Pharmacist Practitioner Infectious Diseases Clinical Pharmacist [facility] for Infectious Disease

## 2024-11-21 NOTE — Telephone Encounter (Signed)
 Resent atorvastatin  20 mg oral tablet script to [facility] and reviewed with patient. No further questions!

## 2024-11-21 NOTE — Addendum Note (Signed)
 Addended by: WADDELL ALAN PARAS on: 11/21/2024 01:53 PM   Modules accepted: Orders

## 2024-11-23 ENCOUNTER — Other Ambulatory Visit: Payer: Self-pay

## 2024-11-23 NOTE — Progress Notes (Signed)
 Specialty Pharmacy Refill Coordination Note  Ricardo [name] is a 63 y.o. male contacted today regarding refills of specialty medication(s) Emtricitabine -Tenofovir  AF (Descovy ); Etravirine  (Intelence )   Patient requested Delivery   Delivery date: 11/27/24   Verified address: [address] [place]  [address]   Medication will be filled on: 11/24/24

## 2024-12-13 ENCOUNTER — Other Ambulatory Visit: Payer: Self-pay

## 2024-12-15 ENCOUNTER — Telehealth: Payer: Self-pay

## 2024-12-15 ENCOUNTER — Other Ambulatory Visit (HOSPITAL_COMMUNITY): Payer: Self-pay

## 2024-12-18 ENCOUNTER — Other Ambulatory Visit (HOSPITAL_COMMUNITY): Payer: Self-pay

## 2024-12-18 ENCOUNTER — Other Ambulatory Visit: Payer: Self-pay | Admitting: Pharmacist

## 2024-12-18 ENCOUNTER — Other Ambulatory Visit: Payer: Self-pay

## 2024-12-18 DIAGNOSIS — B20 Human immunodeficiency virus [HIV] disease: Secondary | ICD-10-CM

## 2024-12-18 MED ORDER — ETRAVIRINE 200 MG PO TABS
400.0000 mg | ORAL_TABLET | Freq: Every day | ORAL | 11 refills | Status: AC
  Filled 2024-12-18 – 2024-12-19 (×3): qty 60, 30d supply, fill #0
  Filled 2025-01-15: qty 60, 30d supply, fill #1

## 2024-12-18 NOTE — Telephone Encounter (Signed)
 Pharmacy Patient Advocate Encounter  Received notification from [facility] that Prior Authorization for INTELENCE  has been DENIED.  Full denial letter will be uploaded to the media tab. See denial reason below.  Due to Generic of the medication intelence  approved (insurance prefers generic)   PA #/Case ID/Reference #: [id number]

## 2024-12-18 NOTE — Telephone Encounter (Signed)
 Pharmacy Patient Advocate Encounter   Received notification from [employer] that prior authorization for INTELENCE   is required/requested.   Insurance verification completed.   The patient is insured through [facility].   Per test claim: PA required; PA submitted to above mentioned insurance via [employer] Key/confirmation #/EOC [id number] Status is pending

## 2024-12-19 ENCOUNTER — Other Ambulatory Visit: Payer: Self-pay

## 2024-12-22 ENCOUNTER — Other Ambulatory Visit (HOSPITAL_COMMUNITY): Payer: Self-pay

## 2024-12-22 NOTE — Progress Notes (Signed)
 Specialty Pharmacy Refill Coordination Note  Ricardo [name] is a 64 y.o. male contacted today regarding refills of specialty medication(s) Emtricitabine -Tenofovir  AF (Descovy ); Etravirine    Patient requested Delivery   Delivery date: 12/26/24   Verified address: [address] [place] [address]   Medication will be filled on: 12/25/24

## 2024-12-25 ENCOUNTER — Other Ambulatory Visit: Payer: Self-pay

## 2025-01-15 ENCOUNTER — Other Ambulatory Visit: Payer: Self-pay

## 2025-01-15 ENCOUNTER — Telehealth: Payer: Self-pay

## 2025-01-15 ENCOUNTER — Other Ambulatory Visit (HOSPITAL_COMMUNITY): Payer: Self-pay

## 2025-01-15 NOTE — Progress Notes (Signed)
 Specialty Pharmacy Refill Coordination Note  Ricardo [name] is a 64 y.o. male contacted today regarding refills of specialty medication(s) Emtricitabine -Tenofovir  AF (Descovy ); Etravirine    Patient requested Delivery   Delivery date: 01/18/25   Verified address: [address] [place] [address]   Medication will be filled on: 01/16/25    Please run Descovy  with gilead card on file   And  Run Intelence  with PAF copay card on file

## 2025-01-15 NOTE — Telephone Encounter (Signed)
 [facility] Patient Advocate Encounter   I was successful in securing patient a $5000 grant from [employer] ([employer]) to provide copayment coverage for INTELENCE .  This will make the out of pocket cost $0.     I have spoken with the patient.    The billing information is as follows and has been shared with [facility].   RxBin: [id number] PCN:   [id number] Member ID: [id number] Group ID: [id number] Dates of Eligibility: 07/19/24 through 01/15/26  Patient knows to call the office with questions or concerns.    Charmaine Sharps, CPhT Specialty Pharmacy Patient [facility] for Infectious Disease Phone: [phone] Fax:  [phone]

## 2025-05-22 ENCOUNTER — Ambulatory Visit: Admitting: Infectious Disease
